# Patient Record
Sex: Male | Born: 1950 | Race: White | Hispanic: No | Marital: Single | Smoking: Current every day smoker
Health system: Southern US, Community
[De-identification: ages and names within clinical notes are randomized; demographics above are authoritative.]

## PROBLEM LIST (undated history)

## (undated) DIAGNOSIS — I2510 Atherosclerotic heart disease of native coronary artery without angina pectoris: Secondary | ICD-10-CM

## (undated) DIAGNOSIS — H9190 Unspecified hearing loss, unspecified ear: Secondary | ICD-10-CM

## (undated) DIAGNOSIS — Z720 Tobacco use: Secondary | ICD-10-CM

## (undated) DIAGNOSIS — R740 Nonspecific elevation of levels of transaminase and lactic acid dehydrogenase [LDH]: Secondary | ICD-10-CM

## (undated) DIAGNOSIS — R7401 Elevation of levels of liver transaminase levels: Secondary | ICD-10-CM

## (undated) DIAGNOSIS — J189 Pneumonia, unspecified organism: Secondary | ICD-10-CM

## (undated) DIAGNOSIS — I255 Ischemic cardiomyopathy: Secondary | ICD-10-CM

## (undated) DIAGNOSIS — E785 Hyperlipidemia, unspecified: Secondary | ICD-10-CM

## (undated) DIAGNOSIS — M1990 Unspecified osteoarthritis, unspecified site: Secondary | ICD-10-CM

## (undated) DIAGNOSIS — I219 Acute myocardial infarction, unspecified: Secondary | ICD-10-CM

## (undated) HISTORY — DX: Elevation of levels of liver transaminase levels: R74.01

## (undated) HISTORY — DX: Hyperlipidemia, unspecified: E78.5

## (undated) HISTORY — DX: Ischemic cardiomyopathy: I25.5

## (undated) HISTORY — DX: Tobacco use: Z72.0

## (undated) HISTORY — DX: Nonspecific elevation of levels of transaminase and lactic acid dehydrogenase (ldh): R74.0

---

## 2014-11-30 ENCOUNTER — Emergency Department (HOSPITAL_COMMUNITY): Payer: Medicare Other

## 2014-11-30 ENCOUNTER — Encounter (HOSPITAL_COMMUNITY): Payer: Self-pay | Admitting: Emergency Medicine

## 2014-11-30 ENCOUNTER — Emergency Department (HOSPITAL_COMMUNITY)
Admission: EM | Admit: 2014-11-30 | Discharge: 2014-11-30 | Disposition: A | Discharge status: Home / Self Care | Payer: Medicare Other | Attending: Emergency Medicine | Admitting: Emergency Medicine

## 2014-11-30 DIAGNOSIS — J45909 Unspecified asthma, uncomplicated: Secondary | ICD-10-CM | POA: Insufficient documentation

## 2014-11-30 DIAGNOSIS — R079 Chest pain, unspecified: Secondary | ICD-10-CM | POA: Diagnosis present

## 2014-11-30 DIAGNOSIS — Z720 Tobacco use: Secondary | ICD-10-CM | POA: Diagnosis not present

## 2014-11-30 DIAGNOSIS — Z880 Allergy status to penicillin: Secondary | ICD-10-CM | POA: Insufficient documentation

## 2014-11-30 DIAGNOSIS — R0789 Other chest pain: Secondary | ICD-10-CM | POA: Diagnosis not present

## 2014-11-30 LAB — BASIC METABOLIC PANEL
ANION GAP: 9 (ref 5–15)
BUN: 6 mg/dL (ref 6–23)
CO2: 23 mmol/L (ref 19–32)
Calcium: 9.3 mg/dL (ref 8.4–10.5)
Chloride: 104 mEq/L (ref 96–112)
Creatinine, Ser: 0.89 mg/dL (ref 0.50–1.35)
GFR calc non Af Amer: 90 mL/min — ABNORMAL LOW (ref >90)
Glucose, Bld: 122 mg/dL — ABNORMAL HIGH (ref 70–99)
POTASSIUM: 4.4 mmol/L (ref 3.5–5.1)
Sodium: 136 mmol/L (ref 135–145)

## 2014-11-30 LAB — I-STAT TROPONIN, ED
Troponin i, poc: 0.01 ng/mL (ref 0.00–0.08)
Troponin i, poc: 0.02 ng/mL (ref 0.00–0.08)

## 2014-11-30 LAB — CBC
HCT: 45.5 % (ref 39.0–52.0)
HEMOGLOBIN: 16 g/dL (ref 13.0–17.0)
MCH: 35.1 pg — AB (ref 26.0–34.0)
MCHC: 35.2 g/dL (ref 30.0–36.0)
MCV: 99.8 fL (ref 78.0–100.0)
Platelets: 185 10*3/uL (ref 150–400)
RBC: 4.56 MIL/uL (ref 4.22–5.81)
RDW: 13.2 % (ref 11.5–15.5)
WBC: 6.5 10*3/uL (ref 4.0–10.5)

## 2014-11-30 LAB — TROPONIN I: Troponin I: <0.03 ng/mL (ref <0.031)

## 2014-11-30 LAB — BRAIN NATRIURETIC PEPTIDE: B NATRIURETIC PEPTIDE 5: 385.3 pg/mL — AB (ref 0.0–100.0)

## 2014-11-30 MED ORDER — AZITHROMYCIN 250 MG PO TABS: 250.0000 mg | ORAL_TABLET | Freq: Every day | ORAL | Status: DC

## 2014-11-30 NOTE — ED Notes (Signed)
PA at bedside.

## 2014-11-30 NOTE — ED Notes (Signed)
EMS reports pt c/o new onset CP 0430, 10/10, called EMS around 0830, pain 8/10.  EMS gave 1 NTG, 324 ASA, pain 2/10.  Pt denies pain now.  EMS states BP before NTG 170/80, after 120/60.  Pt reports hx of chest pain, seen at [facility], cath recommended for blockages but pt declined with no follow-up.

## 2014-11-30 NOTE — Discharge Instructions (Signed)
Take Azithromycin as directed until gone. Follow up with your doctor. Return to the ED with worsening or concerning symptoms.

## 2014-11-30 NOTE — ED Notes (Signed)
Patient transported to X-ray 

## 2014-11-30 NOTE — ED Provider Notes (Signed)
CSN: [id number]     Arrival date & time 11/30/14  0906 History   First MD Initiated Contact with Patient 11/30/14 [phone]     Chief Complaint  Patient presents with  . Chest Pain     (Consider location/radiation/quality/duration/timing/severity/associated sxs/prior Treatment) HPI Comments: Patient is a 64 year old male who presents to the ED via EMS with a past medical history of asthma who presents with chest pain that started this morning at 4:30am that woke him from sleep. The pain was located in the left chest and radiates to his right chest. The pain is described as burning and constant. The pain lasted about 4 hours before resolving after SL nitro by EMS. No aggravating/alleviating factors. Patient reports associated diaphoresis. Patient denies history of MI. He smokes cigarettes. No family history of heart disease.    Past Medical History  Diagnosis Date  . Asthma    History reviewed. No pertinent past surgical history. No family history on file. History  Substance Use Topics  . Smoking status: Current Every Day Smoker -- 0.50 packs/day    Types: Cigarettes  . Smokeless tobacco: Not on file  . Alcohol Use: No    Review of Systems  Constitutional: Negative for fever, chills and fatigue.  HENT: Negative for trouble swallowing.   Eyes: Negative for visual disturbance.  Respiratory: Negative for shortness of breath.   Cardiovascular: Positive for chest pain. Negative for palpitations.  Gastrointestinal: Negative for nausea, vomiting, abdominal pain and diarrhea.  Genitourinary: Negative for dysuria and difficulty urinating.  Musculoskeletal: Negative for arthralgias and neck pain.  Skin: Negative for color change.  Neurological: Negative for dizziness and weakness.  Psychiatric/Behavioral: Negative for dysphoric mood.      Allergies  Penicillins  Home Medications   Prior to Admission medications   Not on File   BP 135/82 mmHg  Pulse 86  Temp(Src) 99.1 F (37.3 C)  (Oral)  Resp 21  SpO2 96% Physical Exam  Constitutional: He is oriented to person, place, and time. He appears well-developed and well-nourished. No distress.  HENT:  Head: Normocephalic and atraumatic.  Eyes: Conjunctivae and EOM are normal.  Neck: Normal range of motion.  Cardiovascular: Normal rate and regular rhythm.  Exam reveals no gallop and no friction rub.   No murmur heard. Pulmonary/Chest: Effort normal and breath sounds normal. He has no wheezes. He has no rales. He exhibits no tenderness.  Abdominal: Soft. He exhibits no distension. There is no tenderness. There is no rebound.  Musculoskeletal: Normal range of motion.  No lower extremity swelling or calf tenderness to palpation.   Neurological: He is alert and oriented to person, place, and time. Coordination normal.  Speech is goal-oriented. Moves limbs without ataxia.   Skin: Skin is warm and dry.  Psychiatric: He has a normal mood and affect. His behavior is normal.  Nursing note and vitals reviewed.   ED Course  Procedures (including critical care time) Labs Review Labs Reviewed  CBC - Abnormal; Notable for the following:    MCH 35.1 (*)    All other components within normal limits  BASIC METABOLIC PANEL - Abnormal; Notable for the following:    Glucose, Bld 122 (*)    GFR calc non Af Amer 90 (*)    All other components within normal limits  BRAIN NATRIURETIC PEPTIDE - Abnormal; Notable for the following:    B Natriuretic Peptide 385.3 (*)    All other components within normal limits  TROPONIN I  I-STAT TROPOININ,  ED  Rosezena Sensor, ED  Rosezena Sensor, ED    Imaging Review Dg Chest 2 View  11/30/2014   CLINICAL DATA:  The patient c/o new onset burning CP across chest 0430, 10/10, pain worse when lying down, called EMS around 0830, pain 8/10. EMS gave 1 NTG, 324 ASA, pain 2/10. Pt denies pain now. EMS states BP before NTG 170/80, after 120/60. Pt reports hx of chest pain, seen at [facility], cath  recommended for blockages but pt declined with no follow-up. Current smoker  EXAM: CHEST  2 VIEW  COMPARISON:  None.  FINDINGS: The heart size and mediastinal contours are within normal limits. Both lungs are clear. The visualized skeletal structures are unremarkable.  IMPRESSION: No active cardiopulmonary disease.   Electronically Signed   By: Rosalie Gums M.D.   On: 11/30/2014 10:02     EKG Interpretation   Date/Time:  Monday November 30 2014 09:07:43 EST Ventricular Rate:  102 PR Interval:  121 QRS Duration: 114 QT Interval:  363 QTC Calculation: 473 R Axis:   -65 Text Interpretation:  Sinus tachycardia LAD, consider left anterior  fascicular block Anteroseptal infarct, old Repol abnrm suggests ischemia,  anterolateral Baseline wander in lead(s) V1 Confirmed by DELOS  MD,  DOUGLAS ([id number]) on 11/30/2014 10:48:33 AM      MDM   Final diagnoses:  Other chest pain    10:45 AM Labs and chest xray unremarkable for acute changes. Vitals stable and patient afebrile.   2:21 PM Patient's delta troponin negative for acute changes. Patient has no chest pain at this time. Patient's HEART score is 3 making him low risk for major cardiac event. Patient will have azithromycin per patient request. Patient instructed to follow up with PCP and return to the ED with worsening or concerning symptoms.   Emilia Beck, PA-C 12/04/14 0865  Geoffery Lyons, MD 12/04/14 [phone]

## 2014-11-30 NOTE — ED Notes (Signed)
NAD at this time. Pt is stable and catching the bus home.  

## 2014-12-22 ENCOUNTER — Encounter (HOSPITAL_COMMUNITY): Payer: Self-pay | Admitting: Emergency Medicine

## 2014-12-22 ENCOUNTER — Emergency Department (HOSPITAL_COMMUNITY): Payer: Medicare Other

## 2014-12-22 ENCOUNTER — Other Ambulatory Visit: Payer: Self-pay | Admitting: *Deleted

## 2014-12-22 ENCOUNTER — Inpatient Hospital Stay (HOSPITAL_COMMUNITY)
Admission: EM | Admit: 2014-12-22 | Discharge: 2014-12-24 | DRG: 249 | Disposition: A | Discharge status: Home / Self Care | Payer: Medicare Other | Attending: Cardiology | Admitting: Cardiology

## 2014-12-22 ENCOUNTER — Encounter (HOSPITAL_COMMUNITY)
Admission: EM | Disposition: A | Discharge status: Home / Self Care | Payer: Self-pay | Source: Home / Self Care | Attending: Cardiology

## 2014-12-22 DIAGNOSIS — F17210 Nicotine dependence, cigarettes, uncomplicated: Secondary | ICD-10-CM | POA: Diagnosis present

## 2014-12-22 DIAGNOSIS — Z880 Allergy status to penicillin: Secondary | ICD-10-CM

## 2014-12-22 DIAGNOSIS — I447 Left bundle-branch block, unspecified: Secondary | ICD-10-CM | POA: Diagnosis present

## 2014-12-22 DIAGNOSIS — Z6829 Body mass index (BMI) 29.0-29.9, adult: Secondary | ICD-10-CM | POA: Diagnosis not present

## 2014-12-22 DIAGNOSIS — Z720 Tobacco use: Secondary | ICD-10-CM

## 2014-12-22 DIAGNOSIS — R739 Hyperglycemia, unspecified: Secondary | ICD-10-CM

## 2014-12-22 DIAGNOSIS — M179 Osteoarthritis of knee, unspecified: Secondary | ICD-10-CM | POA: Diagnosis present

## 2014-12-22 DIAGNOSIS — E785 Hyperlipidemia, unspecified: Secondary | ICD-10-CM | POA: Diagnosis present

## 2014-12-22 DIAGNOSIS — I10 Essential (primary) hypertension: Secondary | ICD-10-CM | POA: Diagnosis present

## 2014-12-22 DIAGNOSIS — M1612 Unilateral primary osteoarthritis, left hip: Secondary | ICD-10-CM | POA: Diagnosis present

## 2014-12-22 DIAGNOSIS — H9190 Unspecified hearing loss, unspecified ear: Secondary | ICD-10-CM | POA: Diagnosis present

## 2014-12-22 DIAGNOSIS — I2510 Atherosclerotic heart disease of native coronary artery without angina pectoris: Secondary | ICD-10-CM | POA: Diagnosis present

## 2014-12-22 DIAGNOSIS — I214 Non-ST elevation (NSTEMI) myocardial infarction: Secondary | ICD-10-CM | POA: Diagnosis present

## 2014-12-22 DIAGNOSIS — I255 Ischemic cardiomyopathy: Secondary | ICD-10-CM | POA: Diagnosis present

## 2014-12-22 DIAGNOSIS — I959 Hypotension, unspecified: Secondary | ICD-10-CM | POA: Diagnosis present

## 2014-12-22 DIAGNOSIS — I252 Old myocardial infarction: Secondary | ICD-10-CM | POA: Diagnosis not present

## 2014-12-22 DIAGNOSIS — I25110 Atherosclerotic heart disease of native coronary artery with unstable angina pectoris: Secondary | ICD-10-CM

## 2014-12-22 DIAGNOSIS — Z598 Other problems related to housing and economic circumstances: Secondary | ICD-10-CM | POA: Diagnosis not present

## 2014-12-22 DIAGNOSIS — G8929 Other chronic pain: Secondary | ICD-10-CM | POA: Diagnosis present

## 2014-12-22 DIAGNOSIS — Z602 Problems related to living alone: Secondary | ICD-10-CM | POA: Diagnosis present

## 2014-12-22 DIAGNOSIS — R079 Chest pain, unspecified: Secondary | ICD-10-CM | POA: Diagnosis present

## 2014-12-22 DIAGNOSIS — E669 Obesity, unspecified: Secondary | ICD-10-CM

## 2014-12-22 DIAGNOSIS — Z01810 Encounter for preprocedural cardiovascular examination: Secondary | ICD-10-CM | POA: Diagnosis not present

## 2014-12-22 HISTORY — DX: Atherosclerotic heart disease of native coronary artery without angina pectoris: I25.10

## 2014-12-22 HISTORY — PX: LEFT HEART CATHETERIZATION WITH CORONARY ANGIOGRAM: SHX5451

## 2014-12-22 HISTORY — DX: Unspecified hearing loss, unspecified ear: H91.90

## 2014-12-22 HISTORY — DX: Acute myocardial infarction, unspecified: I21.9

## 2014-12-22 HISTORY — DX: Unspecified osteoarthritis, unspecified site: M19.90

## 2014-12-22 LAB — GLUCOSE, CAPILLARY
GLUCOSE-CAPILLARY: 187 mg/dL — AB (ref 70–99)
Glucose-Capillary: 119 mg/dL — ABNORMAL HIGH (ref 70–99)
Glucose-Capillary: 145 mg/dL — ABNORMAL HIGH (ref 70–99)

## 2014-12-22 LAB — COMPREHENSIVE METABOLIC PANEL
ALT: 43 U/L (ref 0–53)
AST: 222 U/L — ABNORMAL HIGH (ref 0–37)
Albumin: 4.1 g/dL (ref 3.5–5.2)
Alkaline Phosphatase: 65 U/L (ref 39–117)
Anion gap: 8 (ref 5–15)
CO2: 27 mmol/L (ref 19–32)
Calcium: 9.3 mg/dL (ref 8.4–10.5)
Chloride: 105 mmol/L (ref 96–112)
Creatinine, Ser: 1.04 mg/dL (ref 0.50–1.35)
GFR, EST AFRICAN AMERICAN: 87 mL/min — AB (ref >90)
GFR, EST NON AFRICAN AMERICAN: 75 mL/min — AB (ref >90)
GLUCOSE: 125 mg/dL — AB (ref 70–99)
POTASSIUM: 4.7 mmol/L (ref 3.5–5.1)
Sodium: 140 mmol/L (ref 135–145)
TOTAL PROTEIN: 7.3 g/dL (ref 6.0–8.3)
Total Bilirubin: 1.1 mg/dL (ref 0.3–1.2)

## 2014-12-22 LAB — BASIC METABOLIC PANEL
Anion gap: 13 (ref 5–15)
CHLORIDE: 101 mmol/L (ref 96–112)
CO2: 22 mmol/L (ref 19–32)
Calcium: 9 mg/dL (ref 8.4–10.5)
Creatinine, Ser: 1.17 mg/dL (ref 0.50–1.35)
GFR calc Af Amer: 75 mL/min — ABNORMAL LOW (ref >90)
GFR, EST NON AFRICAN AMERICAN: 65 mL/min — AB (ref >90)
Glucose, Bld: 190 mg/dL — ABNORMAL HIGH (ref 70–99)
POTASSIUM: 3.8 mmol/L (ref 3.5–5.1)
Sodium: 136 mmol/L (ref 135–145)

## 2014-12-22 LAB — I-STAT TROPONIN, ED: Troponin i, poc: 0.12 ng/mL (ref 0.00–0.08)

## 2014-12-22 LAB — CBC
HCT: 44.8 % (ref 39.0–52.0)
HEMOGLOBIN: 16.3 g/dL (ref 13.0–17.0)
MCH: 35.8 pg — AB (ref 26.0–34.0)
MCHC: 36.4 g/dL — AB (ref 30.0–36.0)
MCV: 98.5 fL (ref 78.0–100.0)
Platelets: 239 10*3/uL (ref 150–400)
RBC: 4.55 MIL/uL (ref 4.22–5.81)
RDW: 13 % (ref 11.5–15.5)
WBC: 9.6 10*3/uL (ref 4.0–10.5)

## 2014-12-22 LAB — D-DIMER, QUANTITATIVE: D-Dimer, Quant: 0.33 ug/mL-FEU (ref 0.00–0.48)

## 2014-12-22 LAB — PROTIME-INR
INR: 0.99 (ref 0.00–1.49)
PROTHROMBIN TIME: 13.1 s (ref 11.6–15.2)

## 2014-12-22 LAB — TSH: TSH: 2.111 u[IU]/mL (ref 0.350–4.500)

## 2014-12-22 LAB — HEMOGLOBIN A1C
Hgb A1c MFr Bld: 5 % (ref <5.7)
Mean Plasma Glucose: 97 mg/dL (ref <117)

## 2014-12-22 LAB — TROPONIN I
Troponin I: 0.12 ng/mL — ABNORMAL HIGH (ref <0.031)
Troponin I: 43.54 ng/mL (ref <0.031)
Troponin I: >80 ng/mL (ref <0.031)

## 2014-12-22 LAB — BRAIN NATRIURETIC PEPTIDE: B NATRIURETIC PEPTIDE 5: 91.6 pg/mL (ref 0.0–100.0)

## 2014-12-22 LAB — HEPARIN LEVEL (UNFRACTIONATED): HEPARIN UNFRACTIONATED: 0.14 [IU]/mL — AB (ref 0.30–0.70)

## 2014-12-22 SURGERY — LEFT HEART CATHETERIZATION WITH CORONARY ANGIOGRAM

## 2014-12-22 MED ORDER — VERAPAMIL HCL 2.5 MG/ML IV SOLN
INTRAVENOUS | Status: AC
  Filled 2014-12-22: qty 2

## 2014-12-22 MED ORDER — HEPARIN BOLUS VIA INFUSION
4000.0000 [IU] | Freq: Once | INTRAVENOUS | Status: AC
  Administered 2014-12-22: 4000 [IU] via INTRAVENOUS
  Filled 2014-12-22: qty 4000

## 2014-12-22 MED ORDER — SODIUM CHLORIDE 0.9 % IV SOLN: 250.0000 mL | INTRAVENOUS | Status: DC | PRN

## 2014-12-22 MED ORDER — NICOTINE 14 MG/24HR TD PT24
14.0000 mg | MEDICATED_PATCH | Freq: Every day | TRANSDERMAL | Status: DC
  Administered 2014-12-22 – 2014-12-24 (×3): 14 mg via TRANSDERMAL
  Filled 2014-12-22 (×3): qty 1

## 2014-12-22 MED ORDER — NITROGLYCERIN 1 MG/10 ML FOR IR/CATH LAB
INTRA_ARTERIAL | Status: AC
  Filled 2014-12-22: qty 10

## 2014-12-22 MED ORDER — SODIUM CHLORIDE 0.9 % IJ SOLN: 3.0000 mL | INTRAMUSCULAR | Status: DC | PRN

## 2014-12-22 MED ORDER — METOPROLOL TARTRATE 12.5 MG HALF TABLET
12.5000 mg | ORAL_TABLET | Freq: Two times a day (BID) | ORAL | Status: DC
  Administered 2014-12-22: 12.5 mg via ORAL
  Filled 2014-12-22 (×2): qty 1

## 2014-12-22 MED ORDER — SODIUM CHLORIDE 0.9 % IV SOLN
INTRAVENOUS | Status: DC
  Administered 2014-12-22: 12:00:00 via INTRAVENOUS

## 2014-12-22 MED ORDER — HEPARIN (PORCINE) IN NACL 100-0.45 UNIT/ML-% IJ SOLN: 1200.0000 [IU]/h | INTRAMUSCULAR | Status: DC

## 2014-12-22 MED ORDER — HEPARIN (PORCINE) IN NACL 100-0.45 UNIT/ML-% IJ SOLN
1400.0000 [IU]/h | INTRAMUSCULAR | Status: DC
  Administered 2014-12-22: 18:00:00 1200 [IU]/h via INTRAVENOUS
  Filled 2014-12-22 (×3): qty 250

## 2014-12-22 MED ORDER — INSULIN ASPART 100 UNIT/ML ~~LOC~~ SOLN: 0.0000 [IU] | Freq: Three times a day (TID) | SUBCUTANEOUS | Status: DC

## 2014-12-22 MED ORDER — ASPIRIN 81 MG PO CHEW: 324.0000 mg | CHEWABLE_TABLET | ORAL | Status: DC

## 2014-12-22 MED ORDER — ASPIRIN 81 MG PO CHEW
81.0000 mg | CHEWABLE_TABLET | Freq: Every day | ORAL | Status: DC
  Administered 2014-12-23 – 2014-12-24 (×2): 81 mg via ORAL
  Filled 2014-12-22 (×2): qty 1

## 2014-12-22 MED ORDER — FENTANYL CITRATE 0.05 MG/ML IJ SOLN
INTRAMUSCULAR | Status: AC
  Filled 2014-12-22: qty 2

## 2014-12-22 MED ORDER — MORPHINE SULFATE 4 MG/ML IJ SOLN
4.0000 mg | INTRAMUSCULAR | Status: DC | PRN
  Administered 2014-12-23: 02:00:00 2 mg via INTRAVENOUS
  Filled 2014-12-22: qty 1

## 2014-12-22 MED ORDER — ATORVASTATIN CALCIUM 80 MG PO TABS
80.0000 mg | ORAL_TABLET | Freq: Every day | ORAL | Status: DC
  Filled 2014-12-22: qty 1

## 2014-12-22 MED ORDER — INSULIN ASPART 100 UNIT/ML ~~LOC~~ SOLN
0.0000 [IU] | Freq: Three times a day (TID) | SUBCUTANEOUS | Status: DC
  Administered 2014-12-22: 2 [IU] via SUBCUTANEOUS
  Administered 2014-12-23: 1 [IU] via SUBCUTANEOUS
  Administered 2014-12-23: 18:00:00 2 [IU] via SUBCUTANEOUS

## 2014-12-22 MED ORDER — ACETAMINOPHEN 325 MG PO TABS: 650.0000 mg | ORAL_TABLET | ORAL | Status: DC | PRN

## 2014-12-22 MED ORDER — HEPARIN (PORCINE) IN NACL 100-0.45 UNIT/ML-% IJ SOLN
1200.0000 [IU]/h | INTRAMUSCULAR | Status: DC
  Administered 2014-12-22: 12:00:00 1200 [IU]/h via INTRAVENOUS
  Filled 2014-12-22 (×2): qty 250

## 2014-12-22 MED ORDER — INSULIN ASPART 100 UNIT/ML ~~LOC~~ SOLN: 0.0000 [IU] | Freq: Every day | SUBCUTANEOUS | Status: DC

## 2014-12-22 MED ORDER — NITROGLYCERIN 0.4 MG SL SUBL
0.4000 mg | SUBLINGUAL_TABLET | SUBLINGUAL | Status: AC | PRN
  Administered 2014-12-22 (×3): 0.4 mg via SUBLINGUAL
  Filled 2014-12-22: qty 1

## 2014-12-22 MED ORDER — ONDANSETRON HCL 4 MG/2ML IJ SOLN
4.0000 mg | Freq: Four times a day (QID) | INTRAMUSCULAR | Status: DC | PRN
  Administered 2014-12-23: 4 mg via INTRAVENOUS
  Filled 2014-12-22: qty 2

## 2014-12-22 MED ORDER — SODIUM CHLORIDE 0.9 % IJ SOLN
3.0000 mL | Freq: Two times a day (BID) | INTRAMUSCULAR | Status: DC
Start: 2014-12-22 — End: 2014-12-22

## 2014-12-22 MED ORDER — NITROGLYCERIN 0.4 MG SL SUBL: 0.4000 mg | SUBLINGUAL_TABLET | SUBLINGUAL | Status: DC | PRN

## 2014-12-22 MED ORDER — ASPIRIN EC 81 MG PO TBEC: 81.0000 mg | DELAYED_RELEASE_TABLET | Freq: Every day | ORAL | Status: DC

## 2014-12-22 MED ORDER — NITROGLYCERIN IN D5W 200-5 MCG/ML-% IV SOLN
5.0000 ug/min | INTRAVENOUS | Status: DC
  Administered 2014-12-22: 5 ug/min via INTRAVENOUS

## 2014-12-22 MED ORDER — SODIUM CHLORIDE 0.9 % IV SOLN
1.0000 mL/kg/h | INTRAVENOUS | Status: AC
  Administered 2014-12-22: 16:00:00 1 mL/kg/h via INTRAVENOUS

## 2014-12-22 MED ORDER — LIDOCAINE HCL (PF) 1 % IJ SOLN
INTRAMUSCULAR | Status: AC
  Filled 2014-12-22: qty 30

## 2014-12-22 MED ORDER — HEPARIN (PORCINE) IN NACL 2-0.9 UNIT/ML-% IJ SOLN
INTRAMUSCULAR | Status: AC
  Filled 2014-12-22: qty 1000

## 2014-12-22 MED ORDER — HEPARIN (PORCINE) IN NACL 2-0.9 UNIT/ML-% IJ SOLN
INTRAMUSCULAR | Status: AC
  Filled 2014-12-22: qty 500

## 2014-12-22 MED ORDER — ONDANSETRON HCL 4 MG/2ML IJ SOLN: 4.0000 mg | Freq: Four times a day (QID) | INTRAMUSCULAR | Status: DC | PRN

## 2014-12-22 MED ORDER — NITROGLYCERIN IN D5W 200-5 MCG/ML-% IV SOLN
INTRAVENOUS | Status: AC
  Filled 2014-12-22: qty 250

## 2014-12-22 MED ORDER — ASPIRIN 300 MG RE SUPP
300.0000 mg | RECTAL | Status: DC
  Filled 2014-12-22: qty 1

## 2014-12-22 MED ORDER — MORPHINE SULFATE 4 MG/ML IJ SOLN
4.0000 mg | Freq: Once | INTRAMUSCULAR | Status: AC
  Administered 2014-12-22: 4 mg via INTRAVENOUS
  Filled 2014-12-22: qty 1

## 2014-12-22 MED ORDER — MIDAZOLAM HCL 2 MG/2ML IJ SOLN
INTRAMUSCULAR | Status: AC
  Filled 2014-12-22: qty 2

## 2014-12-22 NOTE — ED Notes (Signed)
Spoke with lab regarding troponin ordered. Able to add on at this time.

## 2014-12-22 NOTE — Consult Note (Signed)
[address]       [place] [address]             [phone]      Cardiothoracic Surgery Consultation   Reason for Consult: Severe multi-vessel coronary artery disease and severe LV dysfunction s/p NSTEMI Referring Physician: Dr. Casandra Doffing  Casey Jackson is an 64 y.o. male.  HPI:    The patient is a 64 year old gentleman who is an active smoker with a probable history of coronary disease, having been admitted to [facility] 2 years ago with chest pain. He was told that it looked like he had a prior MI and cath was recommended. He was homeless and left AMA with no follow up. He has felt well until about 3 weeks ago when he started having exertional chest pain that he describes as burning. It was mainly occuring with walking and relieved with rest. He was seen in the ER on 11/30/2014 after he was awoken from sleep with CP at 4:30 am that persisted for 4 hrs before resolving after sublingual NTG by EMS. His troponin was negative and he was felt to be low risk for a major cardiac event by the ER so he was sent out with a prescription for a Z-pak, although CXR was normal. He took the Z-pak and had no further chest pain until a few days ago when he started having the same pain with exertion. This morning he awoke at 3 am with severe chest burning associated with nausea and vomiting x 1 and diaphoresis that he thought initially was reflux. It improve some with drinking some water and waxed and waned for about 5 hrs before he presented to the ER. His initial troponin was 0.12 and the second was 43.54. ECG showed LBBB that was new from 11/30/2014. His prior ECG showed evidence of prior anteroseptal MI. Cath this morning showed severe 3-vessel CAD with an occluded proximal LAD with faint left to left collaterals, 90% LCX before a moderate OM2, and a large dominant RCA with difusie proximal and mid disease with 95% mid stenosis and 70% stenosis before the PDA, 70% mid PL. The LVEF is 25%  with basal to mid inferior hypokinesis. The LVEDP was 26. He has been pain free since admission.   He currently lives in a rooming house by himself and depends on the bus system for transport, although his rooming house is not on the bus route and he has to walk a distance to a bus stop. He has Medicare and Medicaid and lives on disability.    Past Medical History  Diagnosis Date  . Osteoarthritis   . Coronary artery disease   . Myocardial infarction     nstemi  . HOH (hard of hearing)     Past Surgical History  Procedure Laterality Date  . Cardiac catheterization  12/22/2014    History reviewed. No pertinent family history.  Social History:  reports that he has been smoking Cigarettes.  He has a 25 pack-year smoking history. He has never used smokeless tobacco. He reports that he does not drink alcohol or use illicit drugs.  Allergies:  Allergies  Allergen Reactions  . Penicillins Swelling    Medications:  I have reviewed the patient's current medications. Prior to Admission:  Prescriptions prior to admission  Medication Sig Dispense Refill Last Dose  . azithromycin (ZITHROMAX Z-PAK) 250 MG tablet Take 1 tablet (250 mg total) by mouth daily. 552m PO day 1, then  245m PO days 205 (Patient not taking: Reported on 12/22/2014) 6 tablet 0 Completed Course at Unknown time   Scheduled: . aspirin  324 mg Oral NOW   Or  . aspirin  300 mg Rectal NOW  . [START ON 12/23/2014] aspirin EC  81 mg Oral Daily  . atorvastatin  80 mg Oral q1800  . insulin aspart  0-15 Units Subcutaneous TID WC  . metoprolol tartrate  12.5 mg Oral BID  . nicotine  14 mg Transdermal Daily   Continuous: . sodium chloride 100 mL/hr at 12/22/14 1135  . heparin 1,200 Units/hr (12/22/14 1135)   PJOA:[id number] nitroGLYCERIN, ondansetron (ZOFRAN) IV Anti-infectives    None      Results for orders placed or performed during the hospital encounter of 12/22/14 (from the past 48 hour(s))  CBC      Status: Abnormal   Collection Time: 12/22/14  7:25 AM  Result Value Ref Range   WBC 9.6 4.0 - 10.5 K/uL   RBC 4.55 4.22 - 5.81 MIL/uL   Hemoglobin 16.3 13.0 - 17.0 g/dL   HCT 44.8 39.0 - 52.0 %   MCV 98.5 78.0 - 100.0 fL   MCH 35.8 (H) 26.0 - 34.0 pg   MCHC 36.4 (H) 30.0 - 36.0 g/dL   RDW 13.0 11.5 - 15.5 %   Platelets 239 150 - 400 K/uL  Basic metabolic panel     Status: Abnormal   Collection Time: 12/22/14  7:25 AM  Result Value Ref Range   Sodium 136 135 - 145 mmol/L   Potassium 3.8 3.5 - 5.1 mmol/L   Chloride 101 96 - 112 mmol/L   CO2 22 19 - 32 mmol/L   Glucose, Bld 190 (H) 70 - 99 mg/dL   BUN <5 (L) 6 - 23 mg/dL    Comment: REPEATED TO VERIFY   Creatinine, Ser 1.17 0.50 - 1.35 mg/dL   Calcium 9.0 8.4 - 10.5 mg/dL   GFR calc non Af Amer 65 (L) >90 mL/min   GFR calc Af Amer 75 (L) >90 mL/min    Comment: (NOTE) The eGFR has been calculated using the CKD EPI equation. This calculation has not been validated in all clinical situations. eGFR's persistently <90 mL/min signify possible Chronic Kidney Disease.    Anion gap 13 5 - 15  D-dimer, quantitative     Status: None   Collection Time: 12/22/14  7:25 AM  Result Value Ref Range   D-Dimer, Quant 0.33 0.00 - 0.48 ug/mL-FEU    Comment:        AT THE INHOUSE ESTABLISHED CUTOFF VALUE OF 0.48 ug/mL FEU, THIS ASSAY HAS BEEN DOCUMENTED IN THE LITERATURE TO HAVE A SENSITIVITY AND NEGATIVE PREDICTIVE VALUE OF AT LEAST 98 TO 99%.  THE TEST RESULT SHOULD BE CORRELATED WITH AN ASSESSMENT OF THE CLINICAL PROBABILITY OF DVT / VTE.   Troponin I     Status: Abnormal   Collection Time: 12/22/14  7:25 AM  Result Value Ref Range   Troponin I 0.12 (H) <0.031 ng/mL    Comment:        PERSISTENTLY INCREASED TROPONIN VALUES IN THE RANGE OF 0.04-0.49 ng/mL CAN BE SEEN IN:       -UNSTABLE ANGINA       -CONGESTIVE HEART FAILURE       -MYOCARDITIS       -CHEST TRAUMA       -ARRYHTHMIAS       -LATE PRESENTING MYOCARDIAL INFARCTION        -  COPD   CLINICAL FOLLOW-UP RECOMMENDED.   Protime-INR     Status: None   Collection Time: 12/22/14  7:25 AM  Result Value Ref Range   Prothrombin Time 13.1 11.6 - 15.2 seconds   INR 0.99 0.00 - 1.49  I-stat troponin, ED (not at [facility])     Status: Abnormal   Collection Time: 12/22/14  7:47 AM  Result Value Ref Range   Troponin i, poc 0.12 (HH) 0.00 - 0.08 ng/mL   Comment NOTIFIED PHYSICIAN    Comment 3            Comment: Due to the release kinetics of cTnI, a negative result within the first hours of the onset of symptoms does not rule out myocardial infarction with certainty. If myocardial infarction is still suspected, repeat the test at appropriate intervals.   Glucose, capillary     Status: Abnormal   Collection Time: 12/22/14 11:40 AM  Result Value Ref Range   Glucose-Capillary 119 (H) 70 - 99 mg/dL  Comprehensive metabolic panel     Status: Abnormal   Collection Time: 12/22/14 12:05 PM  Result Value Ref Range   Sodium 140 135 - 145 mmol/L   Potassium 4.7 3.5 - 5.1 mmol/L    Comment: DELTA CHECK NOTED   Chloride 105 96 - 112 mmol/L   CO2 27 19 - 32 mmol/L   Glucose, Bld 125 (H) 70 - 99 mg/dL   BUN <5 (L) 6 - 23 mg/dL   Creatinine, Ser 1.04 0.50 - 1.35 mg/dL   Calcium 9.3 8.4 - 10.5 mg/dL   Total Protein 7.3 6.0 - 8.3 g/dL   Albumin 4.1 3.5 - 5.2 g/dL   AST 222 (H) 0 - 37 U/L   ALT 43 0 - 53 U/L   Alkaline Phosphatase 65 39 - 117 U/L   Total Bilirubin 1.1 0.3 - 1.2 mg/dL   GFR calc non Af Amer 75 (L) >90 mL/min   GFR calc Af Amer 87 (L) >90 mL/min    Comment: (NOTE) The eGFR has been calculated using the CKD EPI equation. This calculation has not been validated in all clinical situations. eGFR's persistently <90 mL/min signify possible Chronic Kidney Disease.    Anion gap 8 5 - 15  TSH     Status: None   Collection Time: 12/22/14 12:05 PM  Result Value Ref Range   TSH 2.111 0.350 - 4.500 uIU/mL  Troponin I-(serum)     Status: Abnormal   Collection Time:  12/22/14 12:05 PM  Result Value Ref Range   Troponin I 43.54 (HH) <0.031 ng/mL    Comment:        POSSIBLE MYOCARDIAL ISCHEMIA. SERIAL TESTING RECOMMENDED. CRITICAL RESULT CALLED TO, READ BACK BY AND VERIFIED WITH: COX L RN 12/22/14 1322 COSTELLO B REPEATED TO VERIFY   Brain natriuretic peptide     Status: None   Collection Time: 12/22/14 12:05 PM  Result Value Ref Range   B Natriuretic Peptide 91.6 0.0 - 100.0 pg/mL    Dg Chest 2 View  12/22/2014   CLINICAL DATA:  64 year old with mid chest pain. Shortness of breath and history of smoking.  EXAM: CHEST  2 VIEW  COMPARISON:  11/30/2014  FINDINGS: Lungs are clear without focal airspace disease or pulmonary edema. Lung markings at the right lung base are mildly prominent but unchanged. Heart and mediastinum are within normal limits. The trachea is midline. Degenerative changes at the right [facility] joint. Degenerative endplate changes in the upper thoracic spine.  No acute bone abnormality.  IMPRESSION: No active cardiopulmonary disease.   Electronically Signed   By: Markus Daft M.D.   On: 12/22/2014 08:02    Review of Systems  Constitutional: Positive for diaphoresis. Negative for fever, chills, weight loss and malaise/fatigue.  HENT: Positive for hearing loss.   Eyes: Negative.   Respiratory: Positive for wheezing. Negative for cough, hemoptysis, sputum production and shortness of breath.   Cardiovascular: Positive for chest pain. Negative for palpitations, orthopnea, claudication, leg swelling and PND.  Gastrointestinal: Positive for heartburn, nausea and vomiting. Negative for abdominal pain, diarrhea and melena.  Genitourinary: Negative.   Musculoskeletal: Positive for joint pain.       Left knee and hip  Skin: Negative.   Neurological: Negative.  Negative for weakness.  Endo/Heme/Allergies: Negative.   Psychiatric/Behavioral: Negative.    Blood pressure 125/73, pulse 92, temperature 98 F (36.7 C), temperature source Oral, resp. rate  18, height _0  (1.803 m), weight 95.255 kg (210 lb), SpO2 95 %. Physical Exam  Constitutional: He is oriented to person, place, and time. He appears well-developed and well-nourished. No distress.  HENT:  Head: Normocephalic and atraumatic.  Mouth/Throat: Oropharynx is clear and moist.  Eyes: EOM are normal. Pupils are equal, round, and reactive to light.  Neck: Normal range of motion. Neck supple. No JVD present. No thyromegaly present.  Cardiovascular: Normal rate, regular rhythm and normal heart sounds.   No murmur heard. Respiratory: Effort normal. No respiratory distress. He has wheezes. He has no rales.  GI: Soft. Bowel sounds are normal. He exhibits no distension and no mass. There is tenderness.  Musculoskeletal: Normal range of motion. He exhibits no edema or tenderness.  Lymphadenopathy:    He has no cervical adenopathy.  Neurological: He is alert and oriented to person, place, and time. He has normal strength. No cranial nerve deficit or sensory deficit.  Skin: Skin is warm and dry.  Psychiatric: He has a normal mood and affect.    I have personally reviewed the cardiac cath films:   PROCEDURE: Left heart catheterization with selective coronary angiography, left ventriculogram.   INDICATIONS: NSTEMI  The risks, benefits, and details of the procedure were explained to the patient. The patient verbalized understanding and wanted to proceed. Informed written consent was obtained.  PROCEDURE TECHNIQUE: After Xylocaine anesthesia a 43F slender sheath was placed in the right radial artery with a single anterior needle wall stick. IV Heparin was given. Right coronary angiography was done using a Judkins R4 guide catheter. Left coronary angiography was done using a Judkins L3.5 guide catheter. Left ventriculography was done using a pigtail catheter. A TR band was used for hemostasis.  CONTRAST: Total of 90 cc.  COMPLICATIONS: None.   HEMODYNAMICS: Aortic  pressure was 105/71; LV pressure was 105/13; LVEDP 26. There was no gradient between the left ventricle and aorta.   ANGIOGRAPHIC DATA: The left main coronary artery is widely patent.  The left anterior descending artery is occluded at the ostium. There are left to left collaterals which feed a large, branching diagonal vessel. There is some visible calcium where the mid LAD should be, distal to the origin of this large branching diagonal. There is just trickle flow noted in the LAD and and several septal vessels from left to left collaterals.  The left circumflex artery is a medium-size vessel. There is a small first obtuse marginal. After this, there is a 90% lesion. There is a large, second obtuse marginal. There is also significant  disease in the proximal portion of the continuation of the circumflex. There is a large ramus intermedius which appears to have only mild disease.  The right coronary artery is a large, dominant vessel. There is diffuse disease in the mid RCA with a focal 95% stenosis. In the distal RCA, just before the bifurcation of the posterior lateral artery and posterior descending artery, there is a 70% stenosis. In the posterior lateral artery, there is a 70% stenosis in the midportion of this vessel, before a large bifurcation. The posterior descending artery is very large and feeds the apex. There are only minimal right-to-left collaterals noted.  LEFT VENTRICULOGRAM: Left ventricular angiogram was done in the 30 RAO projection and revealed severely decreased systolic function with an estimated ejection fraction of 25 %. There is basal to mid inferior hypokinesis. The anterior wall does contract. LVEDP was 26 mmHg.  IMPRESSIONS:  1. Patent left main coronary artery. 2. Occluded left anterior descending artery. Left to left collaterals fil the branches. 3. Severe disease in the mid left circumflex artery and in the large OM 2 branch. 4. Severe disease in the mid  and distal right coronary artery. There is significant disease in the mid posterior lateral artery. 5. Severely decreased left ventricular systolic function. LVEDP 26 mmHg. Ejection fraction 25%.  RECOMMENDATION: Will get cardiac surgery consult for possible bypass surgery. The patient was hesitant and would've preferred percutaneous revascularization. Given his social situation, he may not be able to take dual antiplatelet therapy for a prolonged period of time. I think bypass surgery would give him his best chance of LV recovery, and I discussed this with the patient in detail. We'll restart heparin in about 4 hours after the sheath pull. He'll need to be tested for diabetes and will need aggressive secondary prevention.  Assessment/Plan:  The patient has severe 3-vessel coronary disease and severe LV dysfunction presenting with an acute NSTEMI. His LAD occlusion is most likely old given his history. His troponin went from 0.12 to 43.5 after admission so he had a significant insult. I agree that CABG is the best treatment for this gentleman with severe, diffuse multi-vessel disease with LAD occlusion. His LAD does not show up very well but he still has some anterior contractility so hopefully the LAD is underfilled and graftable. I discussed the operative procedure with the patient including alternatives, benefits and risks; including but not limited to bleeding, blood transfusion, infection, stroke, myocardial infarction, graft failure, heart block requiring a permanent pacemaker, organ dysfunction, and death.  Casey Jackson understands and would like to postpone surgery until 2/7 to do some things that he feels need to be done. I told him I could do surgery this Thursday and I would recommend that given the degree of coronary disease and his EF. I think he would be at high risk for further infarction and death if he postpones surgery.  I had a long talk with him about the risk of waiting as well  as risk factor reduction. He will think about it and talk to Dr. Irish Lack further.    I spent 80 minutes performing this consultation and > 50% of this time was spent face to face counseling and coordinating the care of this patient's coronary disease.   BARTLE,BRYAN K 12/22/2014, 3:19 PM

## 2014-12-22 NOTE — ED Notes (Signed)
Troponin results given to primary nurse

## 2014-12-22 NOTE — Progress Notes (Signed)
ANTICOAGULATION CONSULT NOTE - Initial Consult  Pharmacy Consult for heparin Indication: chest pain/ACS  Allergies  Allergen Reactions  . Penicillins Swelling    Patient Measurements: Height: 5\' 11"  (180.3 cm) Weight: 210 lb (95.255 kg) IBW/kg (Calculated) : 75.3 Heparin Dosing Weight: 93kg  Vital Signs: Temp: 98.4 F (36.9 C) (01/26 0720) BP: 112/63 mmHg (01/26 1000) Pulse Rate: 77 (01/26 1000)  Labs:  Recent Labs  12/22/14 0725  HGB 16.3  HCT 44.8  PLT 239  CREATININE 1.17  TROPONINI 0.12*    Estimated Creatinine Clearance: 77.1 mL/min (by C-G formula based on Cr of 1.17).   Medical History: Past Medical History  Diagnosis Date  . Osteoarthritis     Assessment: [age] YOM who presented 2 weeks ago with some chest pain- troponins were negative and chest pain was resolved with nitro and did not return. Patient was discharged with a Z-pack per his request. Presents today with repeat chest pain, unrelieved by nitro. troponins slightly elevated at this time. Note [facility] and a cath was recommended, but patient declined at the time (~2years ago). Baseline Hgb 16.3, plts 239. Patient confirms with me he was not taking any anticoagulants PTA.  Goal of Therapy:  Heparin level 0.3-0.7 units/ml Monitor platelets by anticoagulation protocol: Yes   Plan:  -heparin bolus with 4000 units IV x1, then start infusion at 1200 units/hr -heparin level in 6 hours -daily HL and CBC -follow for s/s bleeding and cath plans  Melvie Paglia D. Abdulhamid Olgin, PharmD, BCPS Clinical Pharmacist Pager: [phone] 12/22/2014 10:18 AM

## 2014-12-22 NOTE — ED Provider Notes (Addendum)
CSN: [id number]     Arrival date & time 12/22/14  3664 History   First MD Initiated Contact with Patient 12/22/14 0719     Chief Complaint  Patient presents with  . Chest Pain     (Consider location/radiation/quality/duration/timing/severity/associated sxs/prior Treatment) Patient is a 64 y.o. male presenting with chest pain. The history is provided by the patient.  Chest Pain Pain location:  L chest and R chest Pain quality: burning and tightness   Pain radiates to:  L shoulder Pain radiates to the back: no   Pain severity:  Severe Onset quality:  Sudden Duration:  1 hour Timing:  Intermittent Progression:  Unchanged Chronicity:  New Context comment:  This morning pain started after he got up to go to the bathroom. However on January 4 he experienced similar pain and was seen here and discharged home. He said since that time even to go and get the mail he has had chest pain and shortness of breath Relieved by:  Nothing Worsened by:  Exertion and deep breathing Ineffective treatments:  Aspirin and nitroglycerin Associated symptoms: nausea, shortness of breath and vomiting   Associated symptoms: no abdominal pain, no cough and no fever   Risk factors: male sex and smoking   Risk factors: no diabetes mellitus, no hypertension, no immobilization and no surgery     Past Medical History  Diagnosis Date  . Asthma    History reviewed. No pertinent past surgical history. History reviewed. No pertinent family history. History  Substance Use Topics  . Smoking status: Current Every Day Smoker -- 0.50 packs/day    Types: Cigarettes  . Smokeless tobacco: Not on file  . Alcohol Use: No    Review of Systems  Constitutional: Negative for fever.  Respiratory: Positive for shortness of breath. Negative for cough.   Cardiovascular: Positive for chest pain.  Gastrointestinal: Positive for nausea and vomiting. Negative for abdominal pain.  All other systems reviewed and are  negative.     Allergies  Penicillins  Home Medications   Prior to Admission medications   Medication Sig Start Date End Date Taking? Authorizing Provider  azithromycin (ZITHROMAX Z-PAK) 250 MG tablet Take 1 tablet (250 mg total) by mouth daily.  PO day 1, then  PO days 205 11/30/14   Kaitlyn Szekalski, PA-C   BP 123/79 mmHg  Pulse 87  Temp(Src) 98.4 F (36.9 C)  Resp 23  SpO2 95% Physical Exam  Constitutional: He is oriented to person, place, and time. He appears well-developed and well-nourished. No distress.  HENT:  Head: Normocephalic and atraumatic.  Mouth/Throat: Oropharynx is clear and moist.  Eyes: Conjunctivae and EOM are normal. Pupils are equal, round, and reactive to light.  Neck: Normal range of motion. Neck supple.  Cardiovascular: Normal rate, regular rhythm and intact distal pulses.   No murmur heard. Pulmonary/Chest: Effort normal. No respiratory distress. He has no wheezes. He has rhonchi in the left upper field. He has no rales. He exhibits tenderness.  Minimal upper substernal pain to palpation  Abdominal: Soft. He exhibits no distension. There is no tenderness. There is no rebound and no guarding.  Musculoskeletal: Normal range of motion. He exhibits no edema or tenderness.  Neurological: He is alert and oriented to person, place, and time.  Skin: Skin is warm and dry. No rash noted. No erythema.  Psychiatric: He has a normal mood and affect. His behavior is normal.  Nursing note and vitals reviewed.   ED Course  Procedures (including critical care  time) Labs Review Labs Reviewed  CBC - Abnormal; Notable for the following:    MCH 35.8 (*)    MCHC 36.4 (*)    All other components within normal limits  BASIC METABOLIC PANEL - Abnormal; Notable for the following:    Glucose, Bld 190 (*)    BUN <5 (*)    GFR calc non Af Amer 65 (*)    GFR calc Af Amer 75 (*)    All other components within normal limits  I-STAT TROPOININ, ED - Abnormal;  Notable for the following:    Troponin i, poc 0.12 (*)    All other components within normal limits  D-DIMER, QUANTITATIVE  TROPONIN I    Imaging Review Dg Chest 2 View  12/22/2014   CLINICAL DATA:  64 year old with mid chest pain. Shortness of breath and history of smoking.  EXAM: CHEST  2 VIEW  COMPARISON:  11/30/2014  FINDINGS: Lungs are clear without focal airspace disease or pulmonary edema. Lung markings at the right lung base are mildly prominent but unchanged. Heart and mediastinum are within normal limits. The trachea is midline. Degenerative changes at the right [facility] joint. Degenerative endplate changes in the upper thoracic spine. No acute bone abnormality.  IMPRESSION: No active cardiopulmonary disease.   Electronically Signed   By: Richarda OverlieAdam  Henn M.D.   On: 12/22/2014 08:02     EKG Interpretation   Date/Time:  Tuesday December 22 2014 07:18:26 EST Ventricular Rate:  87 PR Interval:  139 QRS Duration: 132 QT Interval:  420 QTC Calculation: 505 R Axis:   -70 Text Interpretation:  Sinus rhythm Atrial premature complex Left bundle  branch block No significant change since last tracing Confirmed by  Anitra LauthPLUNKETT  MD, Alphonzo LemmingsWHITNEY ([id number]) on 12/22/2014 7:27:25 AM      MDM   Final diagnoses:  NSTEMI (non-ST elevated myocardial infarction)   Patient presents with recurrent chest pain. He was seen here on 11/30/14 for chest pain and since that time he has had intermittent episodes worse with exertion. This morning he developed significant pain in the upper left and right chest that radiates slightly into the left arm causing him to be short of breath and vomit twice. He does have a significant history of tobacco abuse and asthma. He denies productive cough or infectious symptoms at this time. Patient does have a pleuritic component and today when given nitroglycerin he had no relief in his pain. He is made an appointment with his physician however will not have follow-up till February 17. EKG  shows a left bundle branch block. This is unchanged from his prior EKG on the fourth however no prior EKGs before that to know if this bundle block is recent.  Patient denies any lower extremity edema, recent immobilization, prolonged travel, surgery.  Concern for angina as the cause of his symptoms versus PE and lower concern for infection at this time.  Patient has scant wheezing in the left upper lobe on exam but otherwise lungs are clear.  CBC, BMP, troponin, chest x-ray, d-dimer pending   8:52 AM D-dimer, CBC, BMP within normal limits. Chest x-ray within normal limits. Troponin elevated at 0. 12. After nitroglycerin pain was slightly improved and morphine given with improvement in pain. Will talk to cardiology for further evaluation.  After speaking with the patient again he states a proximally 2 years ago he was at [facility] and they wanted to do a catheterization at that time however because of his living situation he was unable  to have it performed.  Gwyneth Sprout, MD 12/22/14 1610  Gwyneth Sprout, MD 12/22/14 [phone]

## 2014-12-22 NOTE — ED Notes (Addendum)
Pt has been having chest pain x2 weeks, worsened over night, described as sharp burning that is worse with exertional activity. Pt [facility] ago and was told he had collapsed arteries around his heart. Pt reports it as non-radiating. Pt reports nausea and 3 episodes of vomiting this morning. En route EMS gave 324 aspirin and 1 nitro. Pt experienced no relief from nitro. Pt in Sinus tach at 105. bp 128/80. Pt previous smoker 2 packs/day, has decreased to 7-8 cigs/day. Been smoking since 64 yo. Pt reports being awakened from sleep with worsening pain.

## 2014-12-22 NOTE — Progress Notes (Signed)
ANTICOAGULATION CONSULT NOTE - Initial Consult  Pharmacy Consult for Heparin Indication: Multivessel CAD awaiting CABG  Allergies  Allergen Reactions  . Penicillins Swelling    Patient Measurements: Height: 5\' 11"  (180.3 cm) Weight: 210 lb (95.255 kg) IBW/kg (Calculated) : 75.3 Heparin Dosing Weight: 94.4 kg  Vital Signs: Temp: 98 F (36.7 C) (01/26 1204) Temp Source: Oral (01/26 1204) BP: 106/66 mmHg (01/26 1600) Pulse Rate: 73 (01/26 1600)  Labs:  Recent Labs  12/22/14 0725 12/22/14 1205  HGB 16.3  --   HCT 44.8  --   PLT 239  --   LABPROT 13.1  --   INR 0.99  --   CREATININE 1.17 1.04  TROPONINI 0.12* 43.54*    Estimated Creatinine Clearance: 86.8 mL/min (by C-G formula based on Cr of 1.04).   Medical History: Past Medical History  Diagnosis Date  . Osteoarthritis   . Coronary artery disease   . Myocardial infarction     nstemi  . HOH (hard of hearing)     Assessment: Casey Jackson [facility] CP. The patient underwent a cardiac cath that revealed multivessel CAD - getting CVTS consult for possible CABG. Pharmacy was consulted to resume heparin 4 hour after sheath removed. Per discussion with RN, sheath was pulled around 1330. Baseline CBC wnl, Hep Wt: 95 kg  Goal of Therapy:  Heparin level 0.3-0.7 units/ml Monitor platelets by anticoagulation protocol: Yes   Plan:  1. Restart heparin at 1200 units/hr ~4 hours after sheath removal (~1730) 2. Daily heparin levels, CBC 3. Will continue to monitor for any signs/symptoms of bleeding and will follow up with heparin level in 6 hours   Georgina PillionElizabeth Orvel Cutsforth, PharmD, BCPS Clinical Pharmacist Pager: [phone] 12/22/2014 5:07 PM

## 2014-12-22 NOTE — CV Procedure (Signed)
       PROCEDURE:  Left heart catheterization with selective coronary angiography, left ventriculogram.    INDICATIONS:  NSTEMI  The risks, benefits, and details of the procedure were explained to the patient.  The patient verbalized understanding and wanted to proceed.  Informed written consent was obtained.  PROCEDURE TECHNIQUE:  After Xylocaine anesthesia a 30F slender sheath was placed in the right radial artery with a single anterior needle wall stick.   IV Heparin was given.  Right coronary angiography was done using a Judkins R4 guide catheter.  Left coronary angiography was done using a Judkins L3.5 guide catheter.  Left ventriculography was done using a pigtail catheter.  A TR band was used for hemostasis.   CONTRAST:  Total of 90 cc.  COMPLICATIONS:  None.    HEMODYNAMICS:  Aortic pressure was 105/71; LV pressure was 105/13; LVEDP 26.  There was no gradient between the left ventricle and aorta.    ANGIOGRAPHIC DATA:   The left main coronary artery is widely patent.  The left anterior descending artery is occluded at the ostium. There are left to left collaterals which feed a large, branching diagonal vessel. There is some visible calcium where the mid LAD should be, distal to the origin of this large branching diagonal. There is just trickle flow noted in the LAD and and several septal vessels from left to left collaterals.  The left circumflex artery is a medium-size vessel. There is a small first obtuse marginal. After this, there is a 90% lesion. There is a large, second obtuse marginal. There is also significant disease in the proximal portion of the continuation of the circumflex.  There is a large ramus intermedius which appears to have only mild disease.  The right coronary artery is a large, dominant vessel. There is diffuse disease in the mid RCA with a focal 95% stenosis. In the distal RCA, just before the bifurcation of the posterior lateral artery and posterior descending  artery, there is a 70% stenosis. In the posterior lateral artery, there is a 70% stenosis in the midportion of this vessel, before a large bifurcation. The posterior descending artery is very large and feeds the apex. There are only minimal right-to-left collaterals noted.  LEFT VENTRICULOGRAM:  Left ventricular angiogram was done in the 30 RAO projection and revealed severely decreased systolic function with an estimated ejection fraction of 25 %.  There is basal to mid inferior hypokinesis. The anterior wall does contract. LVEDP was 26 mmHg.  IMPRESSIONS:  1. Patent left main coronary artery. 2. Occluded left anterior descending artery.  Left to left collaterals fil the branches. 3. Severe disease in the mid left circumflex artery and in the large OM 2 branch. 4. Severe disease in the mid and distal right coronary artery.  There is significant disease in the mid posterior lateral artery. 5. Severely decreased left ventricular systolic function.  LVEDP 26 mmHg.  Ejection fraction 25%.  RECOMMENDATION:  Will get cardiac surgery consult for possible bypass surgery. The patient was hesitant and would've preferred percutaneous revascularization. Given his social situation, he may not be able to take dual antiplatelet therapy for a prolonged period of time. I think bypass surgery would give him his best chance of LV recovery, and I discussed this with the patient in detail.  We'll restart heparin in about 4 hours after the sheath pull.  He'll need to be tested for diabetes and will need aggressive secondary prevention.

## 2014-12-22 NOTE — ED Notes (Signed)
Caardiology at bedside.

## 2014-12-22 NOTE — Progress Notes (Signed)
CRITICAL VALUE ALERT  Critical value received:  Troponin I  43.54  Date of notification:  12/22/14  Time of notification:  1324  Critical value read back:Yes.    Nurse who received alert: Victorino DecemberLaura Beaux Wedemeyer RN  MD notified (1st page):  Dr. Eldridge DaceVaranasi  Time of first page: 1325  MD notified (2nd page):  Time of second page:  Responding MD:  Dr Eldridge DaceVaranasi (message given to Amy in cath lab) Dr. Eldridge DaceVaranasi scrubbed for case.  Time MD responded: 1326

## 2014-12-22 NOTE — H&P (Signed)
Physician History and Physical    Patient ID: Casey Jackson MRN: [id number] DOB/AGE: [birth year] 64 y.o. Admit date: 12/22/2014  Primary Care Physician: Scheduled to see [facility] Primary Cardiologist N/A  HPI: 64 yo WM presents to the ED today for worsening chest pain. He awoke at 3 am with severe burning chest pain associated with nausea and vomiting x 1 and diaphoresis. Pain did not resolve for several hours until he came to ED. Now pain free. He reports that he has experienced increasing chest pain for the past 3 weeks. It is worse with exertion and now he can't even walk to his mailbox. He describes it as a burning pain. 2 years ago he was admitted [facility] [place] with similar chest pain. They told him it looked like he had a prior MI and cardiac cath was recommended. He was homeless at the time and had to see about a room here in [place] so he left AMA and had no subsequent follow up. He is now living in a rooming house and has Medicare and Medicaid. He is living on disability. He has no medical follow up but reports he is scheduled to be seen at [facility]. His diet is poor. He eats mostly TV dinners and bologna sandwiches. He has no transportation other than the bus system. He has no know history of DM, HTN, or hyperlipidemia. He has a 50-60 pk yr history of smoking and states he has cut down from 2 pks/day to 0.5 pks/day.   ROS: chronic left hip and knee pain. Otherwise as noted in HPI. All other systems are reviewed and are negative.    Past Medical History  Diagnosis Date  . Osteoarthritis     History reviewed. No pertinent family history.  History   Social History  . Marital Status: Single    Spouse Name: N/A    Number of Children: 0  . Years of Education: N/A   Occupational History  . disabled     former Corporate investment bankerconstruction worker   Social History Main Topics  . Smoking status: Current Every Day Smoker -- 0.50 packs/day for 50 years    Types:  Cigarettes  . Smokeless tobacco: Not on file  . Alcohol Use: No  . Drug Use: Not on file  . Sexual Activity: Not on file   Other Topics Concern  . Not on file   Social History Narrative    History reviewed. No pertinent past surgical history.     Medication List    ASK your doctor about these medications        azithromycin 250 MG tablet  Commonly known as:  ZITHROMAX Z-PAK  Take 1 tablet (250 mg total) by mouth daily. 500mg  PO day 1, then 250mg  PO days 205        Physical Exam: Blood pressure 104/63, pulse 79, temperature 98.4 F (36.9 C), resp. rate 19, SpO2 96 %.  Current Weight  No data found for Wt    GENERAL:  Well appearing, obese WM in NAD HEENT:  PERRL, EOMI, sclera are clear. Oropharynx is clear. Wears dentures. NECK:  No jugular venous distention, carotid upstroke brisk and symmetric, no bruits, no thyromegaly or adenopathy LUNGS:  Clear to auscultation bilaterally CHEST:  Unremarkable HEART:  RRR,  PMI not displaced or sustained,S1 and S2 within normal limits, no S3, no S4: no clicks, no rubs, no murmurs ABD:  Soft, nontender. BS +, no masses or bruits. No hepatomegaly, no splenomegaly EXT:  2 +  pulses throughout, no edema, no cyanosis no clubbing SKIN:  Warm and dry.  No rashes NEURO:  Alert and oriented x 3. Cranial nerves II through XII intact. PSYCH:  Cognitively intact     Labs:   Lab Results  Component Value Date   WBC 9.6 12/22/2014   HGB 16.3 12/22/2014   HCT 44.8 12/22/2014   MCV 98.5 12/22/2014   PLT 239 12/22/2014     Recent Labs Lab 12/22/14 0725  NA 136  K 3.8  CL 101  CO2 22  BUN <5*  CREATININE 1.17  CALCIUM 9.0  GLUCOSE 190*   Lab Results  Component Value Date   TROPONINI 0.12* 12/22/2014   TROPONINI <0.03 11/30/2014   No results found for: CHOL No results found for: HDL No results found for: LDLCALC No results found for: TRIG No results found for: CHOLHDL No results found for: LDLDIRECT  No results found for:  PROBNP No results found for: TSH No results found for: HGBA1C  Radiology: CHEST 2 VIEW  COMPARISON: 11/30/2014  FINDINGS: Lungs are clear without focal airspace disease or pulmonary edema. Lung markings at the right lung base are mildly prominent but unchanged. Heart and mediastinum are within normal limits. The trachea is midline. Degenerative changes at the right [facility] joint. Degenerative endplate changes in the upper thoracic spine. No acute bone abnormality.  IMPRESSION: No active cardiopulmonary disease.   Electronically Signed  By: Richarda Overlie M.D.  On: 12/22/2014 08:02  EKG: NSR with PACs. LBBB. Compared to 11/30/14 the LBBB is new. Prior Ecg showed evidence of prior anteroseptal infarct. I have personally reviewed and interpreted this study.   ASSESSMENT AND PLAN:  1. NSTEMI. Recent accelerating chest pain. Will give ASA and IV heparin. Start beta blocker and statin. Check lipid status.  Plan LHC +/- PCI today. The procedure and risks were reviewed including but not limited to death, myocardial infarction, stroke, arrythmias, bleeding, transfusion, emergency surgery, dye allergy, or renal dysfunction. The patient voices understanding and is agreeable to proceed. 2. Hyperglycemia. ? Diabetic. Check A1c. SSI 3. Tobacco abuse. Counseled on smoking cessation. Start nicotine patch if needed. 4. Osteoarthritis of hip/knee. 5. Obesity. 6. Poor social support. Will need case management to assess home and medication needs.   Signed: Gurveer Colucci [place], MDFACC  12/22/2014, 10:05 AM

## 2014-12-22 NOTE — ED Notes (Signed)
Pt has experienced no relief with 2 nitro, 3rd nitro given at this time. MD notified.

## 2014-12-23 ENCOUNTER — Encounter (HOSPITAL_COMMUNITY): Payer: Self-pay | Admitting: Anesthesiology

## 2014-12-23 ENCOUNTER — Encounter (HOSPITAL_COMMUNITY): Payer: Medicare Other

## 2014-12-23 ENCOUNTER — Encounter (HOSPITAL_COMMUNITY)
Admission: EM | Disposition: A | Discharge status: Home / Self Care | Payer: Medicare Other | Source: Home / Self Care | Attending: Cardiology

## 2014-12-23 DIAGNOSIS — I214 Non-ST elevation (NSTEMI) myocardial infarction: Principal | ICD-10-CM

## 2014-12-23 DIAGNOSIS — I2510 Atherosclerotic heart disease of native coronary artery without angina pectoris: Secondary | ICD-10-CM

## 2014-12-23 DIAGNOSIS — I25110 Atherosclerotic heart disease of native coronary artery with unstable angina pectoris: Secondary | ICD-10-CM

## 2014-12-23 DIAGNOSIS — I5021 Acute systolic (congestive) heart failure: Secondary | ICD-10-CM

## 2014-12-23 HISTORY — PX: PERCUTANEOUS CORONARY STENT INTERVENTION (PCI-S): SHX5485

## 2014-12-23 LAB — BASIC METABOLIC PANEL
Anion gap: 7 (ref 5–15)
BUN: 7 mg/dL (ref 6–23)
CALCIUM: 8.6 mg/dL (ref 8.4–10.5)
CHLORIDE: 103 mmol/L (ref 96–112)
CO2: 26 mmol/L (ref 19–32)
Creatinine, Ser: 1.09 mg/dL (ref 0.50–1.35)
GFR calc non Af Amer: 71 mL/min — ABNORMAL LOW (ref >90)
GFR, EST AFRICAN AMERICAN: 82 mL/min — AB (ref >90)
Glucose, Bld: 112 mg/dL — ABNORMAL HIGH (ref 70–99)
POTASSIUM: 3.8 mmol/L (ref 3.5–5.1)
Sodium: 136 mmol/L (ref 135–145)

## 2014-12-23 LAB — GLUCOSE, CAPILLARY
Glucose-Capillary: 127 mg/dL — ABNORMAL HIGH (ref 70–99)
Glucose-Capillary: 94 mg/dL (ref 70–99)
Glucose-Capillary: 160 mg/dL — ABNORMAL HIGH (ref 70–99)
Glucose-Capillary: 114 mg/dL — ABNORMAL HIGH (ref 70–99)

## 2014-12-23 LAB — CREATININE, SERUM
CREATININE: 1.12 mg/dL (ref 0.50–1.35)
GFR calc non Af Amer: 69 mL/min — ABNORMAL LOW (ref >90)
GFR, EST AFRICAN AMERICAN: 80 mL/min — AB (ref >90)

## 2014-12-23 LAB — CBC
HCT: 40.5 % (ref 39.0–52.0)
HCT: 39.6 % (ref 39.0–52.0)
HEMOGLOBIN: 13.9 g/dL (ref 13.0–17.0)
Hemoglobin: 14.5 g/dL (ref 13.0–17.0)
MCH: 35.5 pg — ABNORMAL HIGH (ref 26.0–34.0)
MCH: 35.2 pg — ABNORMAL HIGH (ref 26.0–34.0)
MCHC: 35.8 g/dL (ref 30.0–36.0)
MCHC: 35.1 g/dL (ref 30.0–36.0)
MCV: 99 fL (ref 78.0–100.0)
MCV: 100.3 fL — ABNORMAL HIGH (ref 78.0–100.0)
Platelets: 197 10*3/uL (ref 150–400)
Platelets: 195 10*3/uL (ref 150–400)
RBC: 4.09 MIL/uL — ABNORMAL LOW (ref 4.22–5.81)
RBC: 3.95 MIL/uL — AB (ref 4.22–5.81)
RDW: 13.2 % (ref 11.5–15.5)
RDW: 13.1 % (ref 11.5–15.5)
WBC: 9.6 10*3/uL (ref 4.0–10.5)
WBC: 8.9 10*3/uL (ref 4.0–10.5)

## 2014-12-23 LAB — TROPONIN I: Troponin I: 75 ng/mL (ref <0.031)

## 2014-12-23 LAB — URINALYSIS, ROUTINE W REFLEX MICROSCOPIC
BILIRUBIN URINE: NEGATIVE
GLUCOSE, UA: NEGATIVE mg/dL
Hgb urine dipstick: NEGATIVE
Ketones, ur: 15 mg/dL — AB
LEUKOCYTES UA: NEGATIVE
Nitrite: NEGATIVE
Protein, ur: NEGATIVE mg/dL
Specific Gravity, Urine: 1.03 (ref 1.005–1.030)
Urobilinogen, UA: 1 mg/dL (ref 0.0–1.0)
pH: 5 (ref 5.0–8.0)

## 2014-12-23 LAB — LIPID PANEL
Cholesterol: 204 mg/dL — ABNORMAL HIGH (ref 0–200)
HDL: 37 mg/dL — ABNORMAL LOW (ref >39)
LDL Cholesterol: 141 mg/dL — ABNORMAL HIGH (ref 0–99)
Total CHOL/HDL Ratio: 5.5 RATIO
Triglycerides: 132 mg/dL (ref <150)
VLDL: 26 mg/dL (ref 0–40)

## 2014-12-23 LAB — SURGICAL PCR SCREEN
MRSA, PCR: NEGATIVE
Staphylococcus aureus: NEGATIVE

## 2014-12-23 LAB — HEPARIN LEVEL (UNFRACTIONATED): Heparin Unfractionated: 0.37 IU/mL (ref 0.30–0.70)

## 2014-12-23 LAB — POCT ACTIVATED CLOTTING TIME: Activated Clotting Time: 405 seconds

## 2014-12-23 SURGERY — PERCUTANEOUS CORONARY STENT INTERVENTION (PCI-S)
Anesthesia: LOCAL

## 2014-12-23 MED ORDER — ACETAMINOPHEN 325 MG PO TABS: 650.0000 mg | ORAL_TABLET | ORAL | Status: DC | PRN

## 2014-12-23 MED ORDER — VERAPAMIL HCL 2.5 MG/ML IV SOLN
INTRAVENOUS | Status: AC
  Filled 2014-12-23: qty 2

## 2014-12-23 MED ORDER — SODIUM CHLORIDE 0.9 % IV SOLN: 250.0000 mL | INTRAVENOUS | Status: DC | PRN

## 2014-12-23 MED ORDER — PRASUGREL HCL 10 MG PO TABS
ORAL_TABLET | ORAL | Status: AC
  Filled 2014-12-23: qty 6

## 2014-12-23 MED ORDER — ASPIRIN 81 MG PO CHEW: 81.0000 mg | CHEWABLE_TABLET | Freq: Every day | ORAL | Status: DC

## 2014-12-23 MED ORDER — PRASUGREL HCL 10 MG PO TABS
ORAL_TABLET | ORAL | Status: AC
  Filled 2014-12-23: qty 1

## 2014-12-23 MED ORDER — NITROGLYCERIN 1 MG/10 ML FOR IR/CATH LAB
INTRA_ARTERIAL | Status: AC
  Filled 2014-12-23: qty 10

## 2014-12-23 MED ORDER — PRASUGREL HCL 10 MG PO TABS
10.0000 mg | ORAL_TABLET | Freq: Every day | ORAL | Status: DC
  Administered 2014-12-24: 10 mg via ORAL
  Filled 2014-12-23 (×2): qty 1

## 2014-12-23 MED ORDER — MIDAZOLAM HCL 2 MG/2ML IJ SOLN
INTRAMUSCULAR | Status: AC
  Filled 2014-12-23: qty 2

## 2014-12-23 MED ORDER — MORPHINE SULFATE 2 MG/ML IJ SOLN: 2.0000 mg | INTRAMUSCULAR | Status: DC | PRN

## 2014-12-23 MED ORDER — BIVALIRUDIN 250 MG IV SOLR
INTRAVENOUS | Status: AC
  Filled 2014-12-23: qty 250

## 2014-12-23 MED ORDER — NICOTINE 14 MG/24HR TD PT24: 14.0000 mg | MEDICATED_PATCH | Freq: Every day | TRANSDERMAL | Status: DC

## 2014-12-23 MED ORDER — GUAIFENESIN-DM 100-10 MG/5ML PO SYRP
5.0000 mL | ORAL_SOLUTION | ORAL | Status: DC | PRN
  Administered 2014-12-23: 5 mL via ORAL
  Filled 2014-12-23: qty 5

## 2014-12-23 MED ORDER — LIDOCAINE HCL (PF) 1 % IJ SOLN
INTRAMUSCULAR | Status: AC
  Filled 2014-12-23: qty 30

## 2014-12-23 MED ORDER — CETYLPYRIDINIUM CHLORIDE 0.05 % MT LIQD
7.0000 mL | Freq: Two times a day (BID) | OROMUCOSAL | Status: DC
  Administered 2014-12-23 – 2014-12-24 (×2): 7 mL via OROMUCOSAL

## 2014-12-23 MED ORDER — SODIUM CHLORIDE 0.9 % IV SOLN: Freq: Once | INTRAVENOUS | Status: DC

## 2014-12-23 MED ORDER — ISOSORBIDE MONONITRATE ER 60 MG PO TB24: 60.0000 mg | ORAL_TABLET | Freq: Every day | ORAL | Status: DC

## 2014-12-23 MED ORDER — ONDANSETRON HCL 4 MG/2ML IJ SOLN: 4.0000 mg | Freq: Four times a day (QID) | INTRAMUSCULAR | Status: DC | PRN

## 2014-12-23 MED ORDER — HEPARIN SODIUM (PORCINE) 5000 UNIT/ML IJ SOLN
5000.0000 [IU] | Freq: Three times a day (TID) | INTRAMUSCULAR | Status: DC
  Administered 2014-12-24: 5000 [IU] via SUBCUTANEOUS
  Filled 2014-12-23 (×3): qty 1

## 2014-12-23 MED ORDER — SODIUM CHLORIDE 0.9 % IV SOLN
0.2500 mg/kg/h | INTRAVENOUS | Status: AC
  Filled 2014-12-23: qty 250

## 2014-12-23 MED ORDER — SODIUM CHLORIDE 0.9 % IJ SOLN
3.0000 mL | Freq: Two times a day (BID) | INTRAMUSCULAR | Status: DC
  Administered 2014-12-23: 13:00:00 3 mL via INTRAVENOUS

## 2014-12-23 MED ORDER — SODIUM CHLORIDE 0.9 % IV SOLN
Freq: Once | INTRAVENOUS | Status: AC
  Administered 2014-12-23: 12:00:00 via INTRAVENOUS

## 2014-12-23 MED ORDER — SODIUM CHLORIDE 0.9 % IJ SOLN: 3.0000 mL | INTRAMUSCULAR | Status: DC | PRN

## 2014-12-23 MED ORDER — FENTANYL CITRATE 0.05 MG/ML IJ SOLN
INTRAMUSCULAR | Status: AC
  Filled 2014-12-23: qty 2

## 2014-12-23 MED ORDER — HEPARIN (PORCINE) IN NACL 2-0.9 UNIT/ML-% IJ SOLN
INTRAMUSCULAR | Status: AC
  Filled 2014-12-23: qty 1000

## 2014-12-23 NOTE — Care Management Note (Signed)
Pt needs CABG, but wishes to leave the hospital..he states he has "some personal business to take care of ", and wants to come back on Feb. 8th to get surgery done.  Pt states he lives alone, and has no support after surgery.  Explained to him that we can assist with possible SNF for short term rehab, if needed, as pt will need 24hr assist for 7-10 days post surgical procedure.  Pt still insists that he is leaving, despite high risk of cardiac arrest, given his significant heart disease.  Dr. Shirlee LatchMcLean at bedside--also explained risks to pt, but he continues to want to leave.    Please notify case manager if pt changes his mind regarding surgical procedure.    Thanks,  Jerrell BelfastJulie Wise Lya Holben, RN, BSN Phone #[phone]

## 2014-12-23 NOTE — Progress Notes (Addendum)
Dr. McLean, Case manager and me all talked with patient regarding staying for tomorrow's CABG, however patient insist on leaving AMA despite knowing risk of high probability of out of the hospital cardiac arrest given his significant 3v CAD. Initially planned to discharge patient   Dr. Varanasi came up and talked with patient, after extensive discussion with patient and Dr. Bartle, will plan for PCI of culprit lesion today and discharge on DAPT tomorrow. This way, he will at least be stabilized and decreased the chance of cardiac arrest. And plan for CABG in 1 month.    Previous Rx sent to pharmacy will be cancelled and has to be reordered on discharge if necessary.    Signed, Chrsitopher Wik PA Pager: [phone]  

## 2014-12-23 NOTE — H&P (View-Only) (Signed)
Dr. Shirlee LatchMcLean, Case manager and me all talked with patient regarding staying for tomorrow's CABG, however patient insist on leaving AMA despite knowing risk of high probability of out of the hospital cardiac arrest given his significant 3v CAD. Initially planned to discharge patient   Dr. Eldridge DaceVaranasi came up and talked with patient, after extensive discussion with patient and Dr. Laneta SimmersBartle, will plan for PCI of culprit lesion today and discharge on DAPT tomorrow. This way, he will at least be stabilized and decreased the chance of cardiac arrest. And plan for CABG in 1 month.    Previous Rx sent to pharmacy will be cancelled and has to be reordered on discharge if necessary.    Ramond DialSigned, Zeola Brys PA Pager: [phone]

## 2014-12-23 NOTE — Progress Notes (Signed)
1 Day Post-Op Procedure(s) (LRB): LEFT HEART CATHETERIZATION WITH CORONARY ANGIOGRAM (N/A) Subjective:  Patient had several hrs of chest pain last pm similar to the prior chest pain but not as severe. ECG showed diffuse ST depression and TWI that was worse than on prior ECG. He was started on NTG with some improvement and complete resolution after Morphine. ECG changes persisted. His troponin peaked at > 80 yesterday afternoon and was 75.00 last pm. He feels ok this am and wants to proceed with surgery tomorrow.  Objective: Vital signs in last 24 hours: Temp:  [97.7 F (36.5 C)-98.7 F (37.1 C)] 98.3 F (36.8 C) (01/27 0810) Pulse Rate:  [66-92] 83 (01/27 0810) Cardiac Rhythm:  [-] Normal sinus rhythm (01/27 0755) Resp:  [15-24] 18 (01/27 0810) BP: (87-125)/(61-87) 103/65 mmHg (01/27 0810) SpO2:  [91 %-98 %] 94 % (01/27 0810) Weight:  [209 lb 14.1 oz (95.2 kg)-210 lb (95.255 kg)] 209 lb 14.1 oz (95.2 kg) (01/27 0021)  Hemodynamic parameters for last 24 hours:    Intake/Output from previous day: 01/26 0701 - 01/27 0700 In: 545.5 [P.O.:340; I.V.:205.5] Out: 1200 [Urine:1200] Intake/Output this shift: Total I/O In: 260 [P.O.:260] Out: 100 [Urine:100]  General appearance: alert and cooperative Heart: regular rate and rhythm, S1, S2 normal, no murmur, click, rub or gallop Lungs: clear to auscultation bilaterally  Lab Results:  Recent Labs  12/22/14 0725 12/23/14 0724  WBC 9.6 9.6  HGB 16.3 14.5  HCT 44.8 40.5  PLT 239 197   BMET:  Recent Labs  12/22/14 1205 12/23/14 0724  NA 140 136  K 4.7 3.8  CL 105 103  CO2 27 26  GLUCOSE 125* 112*  BUN <5* 7  CREATININE 1.04 1.09  CALCIUM 9.3 8.6    PT/INR:  Recent Labs  12/22/14 0725  LABPROT 13.1  INR 0.99   ABG No results found for: PHART, HCO3, TCO2, ACIDBASEDEF, O2SAT CBG (last 3)   Recent Labs  12/22/14 1720 12/22/14 2203 12/23/14 0655  GLUCAP 187* 145* 127*    Assessment/Plan: S/P Procedure(s)  (LRB): LEFT HEART CATHETERIZATION WITH CORONARY ANGIOGRAM (N/A) Severe multi-vessel CAD s/p NSTEMI with recurrent angina. Now pain free on heparin and NTG.  Will check 2D echo today and plan to proceed with CABG in the am. I discussed the operative procedure with the patient again including alternatives, benefits and risks; including but not limited to bleeding, blood transfusion, infection, stroke, myocardial infarction, graft failure, heart block requiring a permanent pacemaker, organ dysfunction, and death.  Casey Jackson understands and agrees to proceed.  We will schedule surgery for tomorrow morning.   LOS: 1 day    Nykole Matos K 12/23/2014

## 2014-12-23 NOTE — Progress Notes (Signed)
ANTICOAGULATION CONSULT NOTE  Pharmacy Consult for Heparin Indication: Multivessel CAD awaiting CABG  Allergies  Allergen Reactions  . Penicillins Swelling    Patient Measurements: Height: 5\' 11"  (180.3 cm) Weight: 209 lb 14.1 oz (95.2 kg) IBW/kg (Calculated) : 75.3 Heparin Dosing Weight: 94.4 kg  Vital Signs: Temp: 98.3 F (36.8 C) (01/27 0810) Temp Source: Oral (01/27 0810) BP: 103/65 mmHg (01/27 0810) Pulse Rate: 83 (01/27 0810)  Labs:  Recent Labs  12/22/14 0725 12/22/14 1205 12/22/14 1655 12/22/14 2312 12/22/14 2318 12/23/14 0724  HGB 16.3  --   --   --   --  14.5  HCT 44.8  --   --   --   --  40.5  PLT 239  --   --   --   --  197  LABPROT 13.1  --   --   --   --   --   INR 0.99  --   --   --   --   --   HEPARINUNFRC  --   --   --   --  0.14* 0.37  CREATININE 1.17 1.04  --   --   --  1.09  TROPONINI 0.12* 43.54* >80.00* 75.00*  --   --     Estimated Creatinine Clearance: 82.8 mL/min (by C-G formula based on Cr of 1.09).  Assessment: 64 yo male with CAD, awaiting CABG, for heparin.  Heparin is therapeutic this am  Goal of Therapy:  Heparin level 0.3-0.7 units/ml Monitor platelets by anticoagulation protocol: Yes   Plan:  Cont heparin  1400 units/hr HL later this afternoon to confirm  Thanks for allowing pharmacy to be a part of this patient's care.  Talbert CageLora Juvencio Verdi, PharmD Clinical Pharmacist, [phone] 12/23/2014 9:56 AM

## 2014-12-23 NOTE — Progress Notes (Signed)
Patient Name: Casey Jackson Date of Encounter: 12/23/2014     Active Problems:   NSTEMI (non-ST elevated myocardial infarction)    SUBJECTIVE  Had 3 hours of CP last night, has resolved since. No SOB. Reconsidered his option and agreed to proceed with surgery tomorrow.   CURRENT MEDS . antiseptic oral rinse  7 mL Mouth Rinse BID  . aspirin  81 mg Oral Daily  . insulin aspart  0-5 Units Subcutaneous QHS  . insulin aspart  0-9 Units Subcutaneous TID WC  . nicotine  14 mg Transdermal Daily    OBJECTIVE  Filed Vitals:   12/23/14 0300 12/23/14 0400 12/23/14 0500 12/23/14 0600  BP: 108/65  Pulse: 73     Temp: 98.3 F (36.8 C)     TempSrc: Oral     Resp: 20     Height:      Weight:      SpO2: 93%       Intake/Output Summary (Last 24 hours) at 12/23/14 0642 Last data filed at 12/23/14 0330  Gross per 24 hour  Intake 545.45 ml  Output   1200 ml  Net -654.55 ml   Filed Weights   12/22/14 1000 12/23/14 0021  Weight: 210 lb (95.255 kg) 209 lb 14.1 oz (95.2 kg)    PHYSICAL EXAM  General: Pleasant, NAD. Neuro: Alert and oriented X 3. Moves all extremities spontaneously. Psych: Normal affect. HEENT:  Normal  Neck: Supple without bruits or JVD. Lungs:  Resp regular and unlabored, CTA. Heart: RRR no s3, s4, or murmurs. Abdomen: Soft, non-tender, non-distended, BS + x 4.  Extremities: No clubbing, cyanosis or edema. DP/PT/Radials 2+ and equal bilaterally.  Accessory Clinical Findings  CBC  Recent Labs  12/22/14 0725  WBC 9.6  HGB 16.3  HCT 44.8  MCV 98.5  PLT 239   Basic Metabolic Panel  Recent Labs  12/22/14 0725 12/22/14 1205  NA 136 140  K 3.8 4.7  CL 101 105  CO2 22 27  GLUCOSE 190* 125*  BUN <5* <5*  CREATININE 1.17 1.04  CALCIUM 9.0 9.3   Liver Function Tests  Recent Labs  12/22/14 1205  AST 222*  ALT 43  ALKPHOS 65  BILITOT 1.1  PROT 7.3  ALBUMIN 4.1   Cardiac Enzymes  Recent Labs  12/22/14 1205  12/22/14 1655 12/22/14 2312  TROPONINI 43.54* >80.00* 75.00*   BNP Invalid input(s): POCBNP D-Dimer  Recent Labs  12/22/14 0725  DDIMER 0.33   Hemoglobin A1C  Recent Labs  12/22/14 1205  HGBA1C 5.0   Thyroid Function Tests  Recent Labs  12/22/14 1205  TSH 2.111    TELE NSR with HR 60-80s, PVCs    ECG  NSR with diffuse TWI and ST changes   Radiology/Studies  Dg Chest 2 View  12/22/2014   CLINICAL DATA:  64 year old with mid chest pain. Shortness of breath and history of smoking.  EXAM: CHEST  2 VIEW  COMPARISON:  11/30/2014  FINDINGS: Lungs are clear without focal airspace disease or pulmonary edema. Lung markings at the right lung base are mildly prominent but unchanged. Heart and mediastinum are within normal limits. The trachea is midline. Degenerative changes at the right [facility] joint. Degenerative endplate changes in the upper thoracic spine. No acute bone abnormality.  IMPRESSION: No active cardiopulmonary disease.   Electronically Signed   By: Richarda Overlie M.D.   On: 12/22/2014 08:02   Dg Chest 2 View  11/30/2014   CLINICAL DATA:  The patient c/o new onset burning CP across chest 0430, 10/10, pain worse when lying down, called EMS around 0830, pain 8/10. EMS gave 1 NTG, 324 ASA, pain 2/10. Pt denies pain now. EMS states BP before NTG 170/80, after 120/60. Pt reports hx of chest pain, [facility] cath recommended for blockages but pt declined with no follow-up. Current smoker  EXAM: CHEST  2 VIEW  COMPARISON:  None.  FINDINGS: The heart size and mediastinal contours are within normal limits. Both lungs are clear. The visualized skeletal structures are unremarkable.  IMPRESSION: No active cardiopulmonary disease.   Electronically Signed   By: Rosalie GumsBeth  Brown M.D.   On: 11/30/2014 10:02    ASSESSMENT AND PLAN  1. NSTEMI  - cath 12/22/2014 EF 25%, occluded LAD with L to L collateral, severe dx in mid LCx and large OM2, severe dx in mid and distal RCA. CT surgery consulted  given his poor social service and likely inability to be compliant with DAPT for prolonged period of time  - seen by Dr. 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 Likely need surgery sooner rather than later. Per Dr. Laneta SimmersBartle, can do surgery this [place]  - continue heparin and IV nitro, discussed with patient this morning, he has agreed to proceed with surgery this [place], family arriving from out of town.  - may consider obtain echo either before or after surgery  Addendum: patient initially agreed to have surgery tomorrow, however got a page from nursing staff that he changed his mind again and wish to delay surgery. He had significant amount of CP and EKG last night despite on heparin gtt and nitro gtt, I do not think it would be wise for him to delay surgery any further. I had extensive conversation with him this morning regarding the need for early surgery. Will ask MD to discuss with patient again today  2. Tobacco abuse 3. Obesity 4. Poor social support: case Production designer, theatre/television/filmmanager to assess home and medication need  Signed, Amedeo PlentyMeng, [name] PA-C Pager: [phone]  Patient seen with PA, agree with the above note.  Patient insists on leaving AMA despite large MI with ischemic cardiomyopathy and ongoing chest pain episodes.  CABG was scheduled for tomorrow.  Pain currently suppressed by NTG gtt.  I had a long discussion with the patient about leaving.  I told him that there is a good chance that he will have a cardiac arrest and die at home.  He apparently lives alone in a boarding house.   I think he understands the risks.  He will not tell me why he has to leave.  I cannot hold him here, I do think he is competent to make the decision to leave. Again, I told him that this is a very, very poor choice.  We will turn off the NTG gtt.  If recurrent chest pain does not change his mind, will send him home on ASA, atorvastatin 80 daily, and Imdur 60 daily.   Will contact [facility] to tell them that he is insisting on leaving.  He says he is willing to have CABG in the future.   Marca AnconaDalton Tokiko Diefenderfer 12/23/2014 11:26 AM

## 2014-12-23 NOTE — Progress Notes (Signed)
Pt experiencing chest pain 4/10, EKG done, Dr. Zachery ConchFriedman called informed of patient's chest pain.  New orders received and Dr. Zachery ConchFriedman came to floor to assess patient.  Will continue to monitor patient.

## 2014-12-23 NOTE — Progress Notes (Signed)
  Echocardiogram 2D Echocardiogram has been performed.  Leta JunglingCooper, Avaley Coop M 12/23/2014, 11:16 AM

## 2014-12-23 NOTE — Significant Event (Signed)
Paged re 4/10 CP. No SOB. Pt reports duration 3-4 hours. ECG obtained demonstrating diffuse ST depression and TWI, worse compared to prior. Patient given total of 4mg  IV morphine with resolution of symptoms. ECG changes still persisted.  A slow resolution of ECG changes would not be unexpected in this situation given extent of CAD and myopathy. Although there is no current indication for IABP or urgent CABG, he is tenuous and may require one or both of these measures.

## 2014-12-23 NOTE — Progress Notes (Signed)
[phone] Did not walk with pt due to CP. Pt states he has difficulty with left knee and hip so he may need PT after surgery. Reviewed sternal precautions with pt and demonstrated for him how we will help him to stand with rocking and assistance. Discussed smoking cessation and pt stated he has quit. Has nicotine patch now. Gave [facility] booklet and care guide. Put on pre op video for pt to view when I left room. Pt will need case manager to see re discharge plan. Pt stated there is NT that lives in rooming house that he would like to help with his care if it can be arranged. Discussed importance of mobility and IS after surgery.  Will follow up after surgery. Luetta NuttingCharlene Charde Macfarlane RN BSN 12/23/2014 9:42 AM

## 2014-12-23 NOTE — Progress Notes (Signed)
ANTICOAGULATION CONSULT NOTE  Pharmacy Consult for Heparin Indication: Multivessel CAD awaiting CABG  Allergies  Allergen Reactions  . Penicillins Swelling    Patient Measurements: Height: 5\' 11"  (180.3 cm) Weight: 210 lb (95.255 kg) IBW/kg (Calculated) : 75.3 Heparin Dosing Weight: 94.4 kg  Vital Signs: Temp: 98.7 F (37.1 C) (01/26 2000) Temp Source: Oral (01/26 2000) BP: 94/62 mmHg (01/26 2200) Pulse Rate: 72 (01/26 2200)  Labs:  Recent Labs  12/22/14 0725 12/22/14 1205 12/22/14 1655 12/22/14 2318  HGB 16.3  --   --   --   HCT 44.8  --   --   --   PLT 239  --   --   --   LABPROT 13.1  --   --   --   INR 0.99  --   --   --   HEPARINUNFRC  --   --   --  0.14*  CREATININE 1.17 1.04  --   --   TROPONINI 0.12* 43.54* >80.00*  --     Estimated Creatinine Clearance: 86.8 mL/min (by C-G formula based on Cr of 1.04).  Assessment: 64 yo male with CAD, awaiting CABG, for heparin  Goal of Therapy:  Heparin level 0.3-0.7 units/ml Monitor platelets by anticoagulation protocol: Yes   Plan:  Increase Heparin  1400 units/hr Follow-up am labs.  Geannie RisenGreg Terreon Ekholm, PharmD, BCPS  12/23/2014 12:01 AM

## 2014-12-23 NOTE — Interval H&P Note (Signed)
Cath Lab Visit (complete for each Cath Lab visit)  Clinical Evaluation Leading to the Procedure:   ACS: Yes.    Non-ACS:    Anginal Classification: CCS IV  Anti-ischemic medical therapy: Minimal Therapy (1 class of medications)  Non-Invasive Test Results: No non-invasive testing performed  Prior CABG: No previous CABG   TIMI Score  Patient Information:  TIMI Score is 5  Revascularization of the presumed culprit artery  A (9)  Indication: 11; Score: 9 TIMI Score  Patient Information:  TIMI Score is 5  Revascularization of multiple coronary arteries when the culprit artery cannot clearly be determined  A (9)  Indication: 12; Score: 9    History and Physical Interval Note:  12/23/2014 3:32 PM  Wilfred CurtisLarry Doorn  has presented today for surgery, with the diagnosis of cad  The various methods of treatment have been discussed with the patient and family. After consideration of risks, benefits and other options for treatment, the patient has consented to  Procedure(s): PERCUTANEOUS CORONARY STENT INTERVENTION (PCI-S) (N/A) as a surgical intervention .  The patient's history has been reviewed, patient examined, no change in status, stable for surgery.  I have reviewed the patient's chart and labs.  Questions were answered to the patient's satisfaction.     Jeryn Cerney S.

## 2014-12-23 NOTE — Progress Notes (Signed)
Morphine 2mg  IV given to patient for chest pain at 01:54, pt denies chest pain, SOB, will continue to monitor patient.

## 2014-12-23 NOTE — CV Procedure (Addendum)
       PROCEDURE:  PCI RCA.  INDICATIONS:  Non-STEMI  The risks, benefits, and details of the procedure were explained to the patient.  The patient verbalized understanding and wanted to proceed.  Informed written consent was obtained.  PROCEDURE TECHNIQUE:  After Xylocaine anesthesia a 13F slender sheath was placed in the right radial artery with a single anterior needle wall stick.   IV Angiomax was given.  Right coronary angiography was done using a Judkins R4 guide catheter.  A pro-water wire was placed across the area disease. Anticoagulation was checked with ACT. A 2.5 x 15 balloon was used to predilate. A 3.0 x 24 rebel bare-metal stent was deployed at high pressure. A 3.5 x 20 noncompliant balloon was used to post dilate. There was an excellent angiographic result. A TR band was used for hemostasis.   CONTRAST:  Total of 40 cc.  COMPLICATIONS:  None.      ANGIOGRAPHIC DATA:     The right coronary artery is a large dominant vessel. In the mid vessel, there is a 95%, hazy stenosis. In the distal RCA, there is an 80% stenosis just before the bifurcation of the posterior lateral and posterior descending arteries. The posterior descending artery is very large and widely patent.  After the intervention, it was easier to see collaterals from the RCA to the LAD system.    IMPRESSIONS:  1. Successful PCI of the mid right coronary artery (95% stenosis to 0%) with bare metal stent, 3.0 x 24 rebel, postdilated to greater than 3.5 mm in diameter.  There is still disease in the distal RCA which would be better treated by bypass.  The posterior lateral artery looks free of disease. SVG to PDA may be sufficient to revascularize the RCA territory.   RECOMMENDATION:  I stressed the importance of dual antiplatelet therapy with aspirin and Effient. The patient states he will take both medications. I explained to him that he will get a card for a 30 day free supply.    The plan is for him to get  bypass surgery done in a month with Dr. Laneta SimmersBartle. The patient was scheduled for bypass surgery tomorrow but earlier today, demanded that he would leave the hospital AMA. He stated he had something he had to do all he would not tell us what it was. We explained to him the risks of leaving the hospital would be reinfarction and possible death. Regardless, he was ready to leave. Since this mid right lesion was likely the culprit for his symptoms, I discussed with Dr. Laneta SimmersBartle about revascularizing this mid right coronary artery lesion with a bare-metal stent, and then planning bypass surgery after a month of dual antiplatelet therapy. The patient was agreeable as well. He will need follow-up with CT surgery in a few weeks. After 30 days, he can stop the Effient for 5 days and have his bypass surgery.  Although this was not ideal for the patient, it was the best compromise that we could come up with given his absolute desire to leave the hospital today, despite significant risks to his health.

## 2014-12-24 ENCOUNTER — Encounter (HOSPITAL_COMMUNITY)
Admission: EM | Disposition: A | Discharge status: Home / Self Care | Payer: Self-pay | Source: Home / Self Care | Attending: Cardiology

## 2014-12-24 ENCOUNTER — Encounter (HOSPITAL_COMMUNITY): Payer: Self-pay | Admitting: Interventional Cardiology

## 2014-12-24 ENCOUNTER — Telehealth: Payer: Self-pay | Admitting: Physician Assistant

## 2014-12-24 DIAGNOSIS — Z01810 Encounter for preprocedural cardiovascular examination: Secondary | ICD-10-CM

## 2014-12-24 DIAGNOSIS — I255 Ischemic cardiomyopathy: Secondary | ICD-10-CM | POA: Diagnosis present

## 2014-12-24 DIAGNOSIS — E785 Hyperlipidemia, unspecified: Secondary | ICD-10-CM | POA: Diagnosis present

## 2014-12-24 LAB — CBC
HEMATOCRIT: 40.1 % (ref 39.0–52.0)
HEMOGLOBIN: 14 g/dL (ref 13.0–17.0)
MCH: 35.5 pg — ABNORMAL HIGH (ref 26.0–34.0)
MCHC: 34.9 g/dL (ref 30.0–36.0)
MCV: 101.8 fL — AB (ref 78.0–100.0)
Platelets: 186 10*3/uL (ref 150–400)
RBC: 3.94 MIL/uL — ABNORMAL LOW (ref 4.22–5.81)
RDW: 13 % (ref 11.5–15.5)
WBC: 7.4 10*3/uL (ref 4.0–10.5)

## 2014-12-24 LAB — BASIC METABOLIC PANEL
Anion gap: 6 (ref 5–15)
BUN: 6 mg/dL (ref 6–23)
CO2: 27 mmol/L (ref 19–32)
Calcium: 8.5 mg/dL (ref 8.4–10.5)
Chloride: 105 mmol/L (ref 96–112)
Creatinine, Ser: 1.09 mg/dL (ref 0.50–1.35)
GFR calc non Af Amer: 71 mL/min — ABNORMAL LOW (ref >90)
GFR, EST AFRICAN AMERICAN: 82 mL/min — AB (ref >90)
Glucose, Bld: 116 mg/dL — ABNORMAL HIGH (ref 70–99)
POTASSIUM: 4.3 mmol/L (ref 3.5–5.1)
Sodium: 138 mmol/L (ref 135–145)

## 2014-12-24 LAB — HEPATIC FUNCTION PANEL
ALT: 30 U/L (ref 0–53)
AST: 72 U/L — ABNORMAL HIGH (ref 0–37)
Albumin: 3.2 g/dL — ABNORMAL LOW (ref 3.5–5.2)
Alkaline Phosphatase: 52 U/L (ref 39–117)
Bilirubin, Direct: 0.1 mg/dL (ref 0.0–0.5)
Indirect Bilirubin: 0.9 mg/dL (ref 0.3–0.9)
TOTAL PROTEIN: 6.2 g/dL (ref 6.0–8.3)
Total Bilirubin: 1 mg/dL (ref 0.3–1.2)

## 2014-12-24 LAB — GLUCOSE, CAPILLARY
GLUCOSE-CAPILLARY: 110 mg/dL — AB (ref 70–99)
Glucose-Capillary: 80 mg/dL (ref 70–99)

## 2014-12-24 SURGERY — CORONARY ARTERY BYPASS GRAFTING (CABG)
Anesthesia: General | Site: Chest

## 2014-12-24 MED ORDER — LISINOPRIL 2.5 MG PO TABS
2.5000 mg | ORAL_TABLET | Freq: Every day | ORAL | Status: DC
  Administered 2014-12-24: 09:00:00 2.5 mg via ORAL
  Filled 2014-12-24: qty 1

## 2014-12-24 MED ORDER — NITROGLYCERIN 0.4 MG SL SUBL: 0.4000 mg | SUBLINGUAL_TABLET | SUBLINGUAL | Status: DC | PRN

## 2014-12-24 MED ORDER — LISINOPRIL 2.5 MG PO TABS: 2.5000 mg | ORAL_TABLET | Freq: Every day | ORAL | Status: DC

## 2014-12-24 MED ORDER — PRASUGREL HCL 10 MG PO TABS: 10.0000 mg | ORAL_TABLET | Freq: Every day | ORAL | Status: DC

## 2014-12-24 MED ORDER — ATORVASTATIN CALCIUM 40 MG PO TABS: 40.0000 mg | ORAL_TABLET | Freq: Every day | ORAL | Status: DC

## 2014-12-24 MED FILL — Sodium Chloride IV Soln 0.9%: INTRAVENOUS | Qty: 50 | Status: AC

## 2014-12-24 NOTE — Progress Notes (Signed)
VASCULAR LAB PRELIMINARY  PRELIMINARY  PRELIMINARY  PRELIMINARY  Pre-op Cardiac Surgery  Carotid Findings:  Bilateral:  1-39% ICA stenosis.  Vertebral artery flow is antegrade.      Upper Extremity Right Left  Brachial Pressures 108 triphasic 111 triphasic  Radial Waveforms triphasic triphasic  Ulnar Waveforms triphasic triphasic  Palmar Arch (Allen's Test) * **   Findings:  *Right:  Doppler waveforms remain normal with ulnar and radial compressions.  **Left:  Doppler waveforms remain normal with ulnar and obliterate with radial compressions.    Lower  Extremity Right Left  Dorsalis Pedis    Anterior Tibial 38 severely dampened monophasic 56 severely dampened monophasic  Posterior Tibial 50 monophasic 97 biphasic  Ankle/Brachial Indices 0.45 0.87    Findings:  ABI indicates a severe reduction in arterial flow on the right and a mild reduction on the left.   Raaga Maeder, RVT 12/24/2014, 10:06 AM

## 2014-12-24 NOTE — Telephone Encounter (Signed)
TCM patient  Has an appt on 2/5 at 11:15am per Theodore Demarkhonda Barrett

## 2014-12-24 NOTE — Progress Notes (Signed)
CARDIAC REHAB PHASE I   PRE:  Rate/Rhythm: 91SR  BP:  Supine: 109/61  Sitting:   Standing:    SaO2: 97%RA  MODE:  Ambulation: 500 ft   POST:  Rate/Rhythm: 110 ST  BP:  Supine:   Sitting: 131/74  Standing:    SaO2: 98%RA 0815-0909 Pt walked 500 ft independently with steady gait. No chest burning which pt was happy about. Stressed importance of watching salt with low heart function. Gave pt heart healthy and low sodium handouts. Reviewed importance of effient with stent and gave pt booklet with card. Reviewed NTG use and calling 911. Reviewed MI restrictions and encouraged pt to take it easy as he does a lot of walking. Reviewed temperature precautions and walking on level ground. Did not give ex walking instruction as pt needs surgery. Did tell pt about CRP 2 for after surgery and that we really want to help him get to his healthiest state. Pt promised he would be back for his surgery. Pt does not have scales to weigh daily but encouraged him to watch sodium and let cardiologist know if swelling in belly, feet or ankles.   Luetta Nuttingharlene Ariadne Rissmiller, RN BSN  12/24/2014 8:59 AM

## 2014-12-24 NOTE — Discharge Summary (Signed)
CARDIOLOGY DISCHARGE SUMMARY   Patient ID: Casey CurtisLarry Jackson MRN: [id number] DOB/AGE: 1951/11/10 64 y.o.  Admit date: 12/22/2014 Discharge date: 12/24/2014  PCP: No primary care provider on file. Primary Cardiologist: Dr. SwazilandJordan  Primary Discharge Diagnosis:  NSTEMI - S/P 3.0 x 24 rebel BMS to the RCA, med rx for residual dz, see below, needs CABG  Secondary Discharge Diagnosis:    Hyperlipidemia LDL goal <100   Cardiomyopathy, ischemic   Tobacco use  Consults: Dr. Laneta SimmersBartle  Procedures: Cardiac catheterization, coronary angiogram, left ventriculogram, recath w/ PCI RCA.   Hospital Course: Casey CurtisLarry Vieth is a 64 y.o. male with no history of CAD. He had chest pain, N&V and came to the hospital. His initial troponin was elevated and he was admitted for further evaluation and treatment.   His peak troponin was > 80.00, he was taken to the cath lab on 01/26, results are below. A TCTS consult was called.   He was seen by Dr. Laneta SimmersBartle and surgery was planned for 01/28. He was pain-free on ASA, heparin and nitrates. He was not on a beta blocker or an ACE inhibitor because of borderline blood pressure. His systolic was dropping into the 80s at time while on IV nitroglycerin. He was symptomatic with this. He was not on a statin initially because his LFTs were abnormal. They were rechecked at discharge and have improved so he will be started on a statin.  On 12/23/2014, the patient stated he realized he would need extensive rehabilitation after the surgery and had to go home and take care of some things before he could have. He refuses surgery was adamant about it. He was adamant about needing to go home prior to coming back for the surgery. Therefore, a compromise decision was made to do intervention on the RCA with a bare metal stent with medical therapy for other disease and follow-up as an outpatient.  On 01/27, he was taken back to the cath lab and had a bare-metal stent to the RCA, tolerating  the procedure well. He was given a prescription for Effient which he can get free for 30 days. If he has the bypass surgery, it will be discontinued after 30 days. If he does not have the bypass surgery, consider continuing it.  On 01/28, he was seen by Dr. SwazilandJordan and all data were reviewed. He has hyperlipidemia, lipid profile shown below and his AST had improved so he was started on Lipitor 40 mg daily. His blood sugars had been elevated but hemoglobin A1c was within normal limits at 5.0, so the only dietary changes that are recommended a heart healthy. His blood pressure had improved slightly and he was started on low dose of an ACE inhibitor. We will hold off on a beta blocker for now, as his systolic blood pressure is still approximately 100.  He was seen by cardiac rehabilitation and educated on MI restrictions, stent guidelines, exercise guidelines and heart-healthy lifestyle modifications. Smoking cessation was encouraged and reinforced. He was ambulating without chest pain or shortness of breath and no further inpatient workup was indicated once pre-CABG testing was completed. He is considered stable for discharge, to follow-up as an outpatient.  Labs:   Lab Results  Component Value Date   WBC 7.4 12/24/2014   HGB 14.0 12/24/2014   HCT 40.1 12/24/2014   MCV 101.8* 12/24/2014   PLT 186 12/24/2014     Recent Labs Lab 12/24/14 0510  NA 138  K 4.3  CL 105  CO2 27  BUN 6  CREATININE 1.09  CALCIUM 8.5  PROT 6.2  BILITOT 1.0  ALKPHOS 52  ALT 30  AST 72*  GLUCOSE 116*    Recent Labs  12/22/14 1205 12/22/14 1655 12/22/14 2312  TROPONINI 43.54* >80.00* 75.00*   Lipid Panel     Component Value Date/Time   CHOL 204* 12/23/2014 0724   TRIG 132 12/23/2014 0724   HDL 37* 12/23/2014 0724   CHOLHDL 5.5 12/23/2014 0724   VLDL 26 12/23/2014 0724   LDLCALC 141* 12/23/2014 0724   Lab Results  Component Value Date   HGBA1C 5.0 12/22/2014    Recent Labs  12/22/14 0725    INR 0.99     Radiology: Dg Chest 2 View 12/22/2014   CLINICAL DATA:  64 year old with mid chest pain. Shortness of breath and history of smoking.  EXAM: CHEST  2 VIEW  COMPARISON:  11/30/2014  FINDINGS: Lungs are clear without focal airspace disease or pulmonary edema. Lung markings at the right lung base are mildly prominent but unchanged. Heart and mediastinum are within normal limits. The trachea is midline. Degenerative changes at the right [facility] joint. Degenerative endplate changes in the upper thoracic spine. No acute bone abnormality.  IMPRESSION: No active cardiopulmonary disease.   Electronically Signed   By: Richarda Overlie M.D.   On: 12/22/2014 08:02   CATH: 12/22/2014 ANGIOGRAPHIC DATA: The left main coronary artery is widely patent. The left anterior descending artery is occluded at the ostium. There are left to left collaterals which feed a large, branching diagonal vessel. There is some visible calcium where the mid LAD should be, distal to the origin of this large branching diagonal. There is just trickle flow noted in the LAD and and several septal vessels from left to left collaterals. The left circumflex artery is a medium-size vessel. There is a small first obtuse marginal. After this, there is a 90% lesion. There is a large, second obtuse marginal. There is also significant disease in the proximal portion of the continuation of the circumflex. There is a large ramus intermedius which appears to have only mild disease. The right coronary artery is a large, dominant vessel. There is diffuse disease in the mid RCA with a focal 95% stenosis. In the distal RCA, just before the bifurcation of the posterior lateral artery and posterior descending artery, there is a 70% stenosis. In the posterior lateral artery, there is a 70% stenosis in the midportion of this vessel, before a large bifurcation. The posterior descending artery is very large and feeds the apex. There are only minimal right-to-left  collaterals noted. LEFT VENTRICULOGRAM: Left ventricular angiogram was done in the 30 RAO projection and revealed severely decreased systolic function with an estimated ejection fraction of 25 %. There is basal to mid inferior hypokinesis. The anterior wall contracts. LVEDP was 26 mmHg. IMPRESSIONS: 1. Patent left main coronary artery. 2. Occluded left anterior descending artery. Left to left collaterals fil the branches. 3. Severe disease in the mid left circumflex artery and in the large OM 2 branch. 4. Severe disease in the mid and distal right coronary artery. There is significant disease in the mid posterior lateral artery. 5. Severely decreased left ventricular systolic function. LVEDP 26 mmHg. Ejection fraction 25%. RECOMMENDATION: Will get cardiac surgery consult for possible bypass surgery. The patient was hesitant and would've preferred percutaneous revascularization. Given his social situation, he may not be able to take dual antiplatelet therapy for a prolonged period of time. I think  bypass surgery would give him his best chance of LV recovery, and I discussed this with the patient in detail. We'll restart heparin in about 4 hours after the sheath pull. He'll need to be tested for diabetes and will need aggressive secondary prevention.  EKG: 12/24/2014 Sinus rhythm Left axis deviation Incomplete right bundle branch block Septal infarct , age undetermined Marked ST abnormality, possible inferior subendocardial injury Prolonged QT Abnormal ECG No significant change since last tracing Vent. rate 83 BPM PR interval 126 ms QRS duration 112 ms QT/QTc 434/509 ms P-R-T axes 71 -48 -89  Echo: 12/23/2014 Conclusions - Left ventricle: The cavity size was moderately dilated. Wall thickness was normal. Systolic function was severely reduced. The estimated ejection fraction was in the range of 25% to 30%. Diffuse hypokinesis. There is akinesis of the  entireinferolateral and inferior myocardium. - Mitral valve: There was mild regurgitation. - Atrial septum: No defect or patent foramen ovale was identified.  FOLLOW UP PLANS AND APPOINTMENTS Allergies  Allergen Reactions  . Penicillins Swelling     Medication List    STOP taking these medications        azithromycin 250 MG tablet  Commonly known as:  ZITHROMAX Z-PAK      TAKE these medications        aspirin 81 MG chewable tablet  Chew 1 tablet (81 mg total) by mouth daily.     atorvastatin 40 MG tablet  Commonly known as:  LIPITOR  Take 1 tablet (40 mg total) by mouth daily.     lisinopril 2.5 MG tablet  Commonly known as:  PRINIVIL,ZESTRIL  Take 1 tablet (2.5 mg total) by mouth daily.     nicotine 14 mg/24hr patch  Commonly known as:  NICODERM CQ - dosed in mg/24 hours  Place 1 patch (14 mg total) onto the skin daily.     nitroGLYCERIN 0.4 MG SL tablet  Commonly known as:  NITROSTAT  Place 1 tablet (0.4 mg total) under the tongue every 5 (five) minutes as needed for chest pain.     prasugrel 10 MG Tabs tablet  Commonly known as:  EFFIENT  Take 1 tablet (10 mg total) by mouth daily.  Notes to Patient:  If you are having bypass surgery, take only for 30 days. If you decide not to have the surgery, continue this medication.        Discharge Instructions    Diet - low sodium heart healthy    Complete by:  As directed      Increase activity slowly    Complete by:  As directed           Follow-up Information    Follow up with Ronie Spies, PA-C On 01/01/2015.   Specialty:  Cardiology   Why:  at 11:15 am   Contact information:   [address] [place] [address] [phone]       Follow up with Alleen Borne, MD.   Specialty:  Cardiothoracic Surgery   Contact information:   [address] [place] [address] [place] [address] [phone]       BRING ALL MEDICATIONS WITH YOU TO FOLLOW UP APPOINTMENTS  Time spent with patient  to include physician time: 48 min Signed: Theodore Demark, PA-C 12/24/2014, 9:17 AM Co-Sign MD

## 2014-12-24 NOTE — Discharge Instructions (Signed)
PLEASE REMEMBER TO BRING ALL OF YOUR MEDICATIONS TO EACH OF YOUR FOLLOW-UP OFFICE VISITS. ° °PLEASE ATTEND ALL SCHEDULED FOLLOW-UP APPOINTMENTS.  ° °Activity: Increase activity slowly as tolerated. You may shower, but no soaking baths (or swimming) for 1 week. No driving for 1 week. No lifting over 5 lbs for 2 weeks. No sexual activity for 1 week.  ° °You May Return to Work: in 3 weeks (if applicable) ° °Wound Care: You may wash cath site gently with soap and water. Keep cath site clean and dry. If you notice pain, swelling, bleeding or pus at your cath site, please call [phone]. ° ° ° °Cardiac Cath Site Care °Refer to this sheet in the next few weeks. These instructions provide you with information on caring for yourself after your procedure. Your caregiver may also give you more specific instructions. Your treatment has been planned according to current medical practices, but problems sometimes occur. Call your caregiver if you have any problems or questions after your procedure. °HOME CARE INSTRUCTIONS °· You may shower 24 hours after the procedure. Remove the bandage (dressing) and gently wash the site with plain soap and water. Gently pat the site dry.  °· Do not apply powder or lotion to the site.  °· Do not sit in a bathtub, swimming pool, or whirlpool for 5 to 7 days.  °· No bending, squatting, or lifting anything over 10 pounds (4.5 kg) as directed by your caregiver.  °· Inspect the site at least twice daily.  °· Do not drive home if you are discharged the same day of the procedure. Have someone else drive you.  °· You may drive 24 hours after the procedure unless otherwise instructed by your caregiver.  °What to expect: °· Any bruising will usually fade within 1 to 2 weeks.  °· Blood that collects in the tissue (hematoma) may be painful to the touch. It should usually decrease in size and tenderness within 1 to 2 weeks.  °SEEK IMMEDIATE MEDICAL CARE IF: °· You have unusual pain at the site or down the  affected limb.  °· You have redness, warmth, swelling, or pain at the site.  °· You have drainage (other than a small amount of blood on the dressing).  °· You have chills.  °· You have a fever or persistent symptoms for more than 72 hours.  °· You have a fever and your symptoms suddenly get worse.  °· Your leg becomes pale, cool, tingly, or numb.  °· You have heavy bleeding from the site. Hold pressure on the site.  °Document Released: 12/16/2010 Document Revised: 11/02/2011 Document Reviewed:  ° °

## 2014-12-24 NOTE — Telephone Encounter (Signed)
TCM pt--- Pt discharged on 12/24/14. Triage nurse to call on 12/25/14

## 2014-12-24 NOTE — Progress Notes (Signed)
Patient Name: Casey Jackson Date of Encounter: 12/24/2014  Principal Problem:   NSTEMI (non-ST elevated myocardial infarction) Active Problems:   Hyperlipidemia LDL goal <100   Cardiomyopathy, ischemic   Primary Cardiologist: [place], Dr. Sw[place]il[place]Jordan  Patient Profile: 64 yo male w/ no prev CAD was admitted 01/26 w/ NSTEMI, pt refused CABG and had BMS RCA. Hyperlipidemia, tob use, EF 25%.  SUBJECTIVE: No chest pain overnight, patient stated he saw the video for bypass surgery yesterday and realized how long the recovery would be. States he needs to go home and get some things taken care of before he can come back for the surgery. No chest pain or shortness of breath overnight.  OBJECTIVE Filed Vitals:   12/24/14 0025 12/24/14 0400 12/24/14 0500 12/24/14 0600  BP: 102/61 97/59 89/56  104/65  Pulse: 95 82 81 85  Temp: 98.4 F (36.9 C)  98.1 F (36.7 C)   TempSrc: Oral  Oral   Resp: 18  20   Height:      Weight: 210 lb 15.7 oz (95.7 kg)     SpO2: 100% 96% 97% 96%    Intake/Output Summary (Last 24 hours) at 12/24/14 0624 Last data filed at 12/23/14 2040  Gross per 24 hour  Intake   1085 ml  Output    900 ml  Net    185 ml   Filed Weights   12/22/14 1000 12/23/14 0021 12/24/14 0025  Weight: 210 lb (95.255 kg) 209 lb 14.1 oz (95.2 kg) 210 lb 15.7 oz (95.7 kg)    PHYSICAL EXAM General: Well developed, well nourished, male in no acute distress. Head: Normocephalic, atraumatic.  Neck: Supple without bruits, JVD not elevated Lungs:  Resp regular and unlabored, few rales, good air exchange Heart: RRR, S1, S2, no S3, S4, or murmur; no rub. Abdomen: Soft, non-tender, non-distended, BS + x 4.  Extremities: No clubbing, cyanosis, no edema. Right radial cath site without ecchymosis or hematoma Neuro: Alert and oriented X 3. Moves all extremities spontaneously. Psych: Normal affect.  LABS: CBC:  Recent Labs  12/23/14 1949 12/24/14 0510  WBC 8.9 7.4  HGB 13.9 14.0  HCT 39.6  40.1  MCV 100.3* 101.8*  PLT 195 186   INR:  Recent Labs  12/22/14 0725  INR 0.99   Basic Metabolic Panel:  Recent Labs  46/96/2901/26/16 1205 12/23/14 0724 12/23/14 1949  NA 140 136  --   K 4.7 3.8  --   CL 105 103  --   CO2 27 26  --   GLUCOSE 125* 112*  --   BUN <5* 7  --   CREATININE 1.04 1.09 1.12  CALCIUM 9.3 8.6  --    Liver Function Tests:  Recent Labs  12/22/14 1205  AST 222*  ALT 43  ALKPHOS 65  BILITOT 1.1  PROT 7.3  ALBUMIN 4.1   Cardiac Enzymes:  Recent Labs  12/22/14 1205 12/22/14 1655 12/22/14 2312  TROPONINI 43.54* >80.00* 75.00*    Recent Labs  12/22/14 0747  TROPIPOC 0.12*   D-dimer:  Recent Labs  12/22/14 0725  DDIMER 0.33   Hemoglobin A1C:  Recent Labs  12/22/14 1205  HGBA1C 5.0   Fasting Lipid Panel:  Recent Labs  12/23/14 0724  CHOL 204*  HDL 37*  LDLCALC 141*  TRIG 132  CHOLHDL 5.5   Thyroid Function Tests:  Recent Labs  12/22/14 1205  TSH 2.111   TELE:  SR, ST    ECHO: 12/23/2014 - Left ventricle:  The cavity size was moderately dilated. Wall thickness was normal. Systolic function was severely reduced. The estimated ejection fraction was in the range of 25% to 30%. Diffuse hypokinesis. There is akinesis of the entireinferolateral and inferior myocardium. - Mitral valve: There was mild regurgitation. - Atrial septum: No defect or patent foramen ovale was identified.  CATH: 12/22/2014 ANGIOGRAPHIC DATA: The left main coronary artery is widely patent. The left anterior descending artery is occluded at the ostium. There are left to left collaterals which feed a large, branching diagonal vessel. There is some visible calcium where the mid LAD should be, distal to the origin of this large branching diagonal. There is just trickle flow noted in the LAD and and several septal vessels from left to left collaterals. The left circumflex artery is a medium-size vessel. There is a small first obtuse  marginal. After this, there is a 90% lesion. There is a large, second obtuse marginal. There is also significant disease in the proximal portion of the continuation of the circumflex. There is a large ramus intermedius which appears to have only mild disease. The right coronary artery is a large, dominant vessel. There is diffuse disease in the mid RCA with a focal 95% stenosis. In the distal RCA, just before the bifurcation of the posterior lateral artery and posterior descending artery, there is a 70% stenosis. In the posterior lateral artery, there is a 70% stenosis in the midportion of this vessel, before a large bifurcation. The posterior descending artery is very large and feeds the apex. There are only minimal right-to-left collaterals noted. LEFT VENTRICULOGRAM: Left ventricular angiogram was done in the 30 RAO projection and revealed severely decreased systolic function with an estimated ejection fraction of 25 %. There is basal to mid inferior hypokinesis. The anterior wall contracts. LVEDP was 26 mmHg. IMPRESSIONS: 1. Patent left main coronary artery. 2. Occluded left anterior descending artery. Left to left collaterals fil the branches. 3. Severe disease in the mid left circumflex artery and in the large OM 2 branch. 4. Severe disease in the mid and distal right coronary artery. There is significant disease in the mid posterior lateral artery. 5. Severely decreased left ventricular systolic function. LVEDP 26 mmHg. Ejection fraction 25%. RECOMMENDATION: Will get cardiac surgery consult for possible bypass surgery. The patient was hesitant and would've preferred percutaneous revascularization. Given his social situation, he may not be able to take dual antiplatelet therapy for a prolonged period of time. I think bypass surgery would give him his best chance of LV recovery, and I discussed this with the patient in detail. We'll restart heparin in about 4 hours after the sheath pull.  He'll need to be tested for diabetes and will need aggressive secondary prevention.  Radiology/Studies: Dg Chest 2 View 12/22/2014   CLINICAL DATA:  64 year old with mid chest pain. Shortness of breath and history of smoking.  EXAM: CHEST  2 VIEW  COMPARISON:  11/30/2014  FINDINGS: Lungs are clear without focal airspace disease or pulmonary edema. Lung markings at the right lung base are mildly prominent but unchanged. Heart and mediastinum are within normal limits. The trachea is midline. Degenerative changes at the right [name] joint. Degenerative endplate changes in the upper thoracic spine. No acute bone abnormality.  IMPRESSION: No active cardiopulmonary disease.   Electronically Signed   By: Richarda Overlie M.D.   On: 12/22/2014 08:02   Current Medications:  . antiseptic oral rinse  7 mL Mouth Rinse BID  . aspirin  81 mg Oral  Daily  . heparin  5,000 Units Subcutaneous 3 times per day  . insulin aspart  0-5 Units Subcutaneous QHS  . insulin aspart  0-9 Units Subcutaneous TID WC  . nicotine  14 mg Transdermal Daily  . prasugrel  10 mg Oral Daily   . nitroGLYCERIN Stopped (12/24/14 0430)    ASSESSMENT AND PLAN: Principal Problem:   NSTEMI (non-ST elevated myocardial infarction) - pt refused CABG, had BMS RCA, otherwise medical therapy. Not on BB due to hypotension. IV Nitrates discontinued overnight for low BP.   Active Problems:   Hyperlipidemia LDL goal <100 - AST was elevated on admission, so statin not started. M.D. review and advise    Hyperglycemia - fasting blood sugars have been elevated but hemoglobin A1c was within normal limits at 5.0. Encourage healthier eating habits. This will be difficult due to his social situation.    Ischemic cardiomyopathy - due to hypotension, patient not currently on ACE inhibitor or beta blocker. M.D. advise if patient would be a candidate for Life Vest.  Plan - discharge today  Signed, Theodore Demark , PA-C 6:24 AM 12/24/2014  Patient seen and  examined and history reviewed. Agree with above findings and plan. No chest pain. Off IV Ntg. No wrist hematoma. BP soft but improved off IV Ntg. Will start low dose lisinopril 2.5 mg daily. Will recheck LFTs. If improved will start statin therapy. Glucoses have improved. Plan DC today. Patient understands his cardiac situation and is motivated to return for CABG. Will need close follow up.   Peter Sw[place], MDFACC 12/24/2014 7:17 AM

## 2014-12-24 NOTE — Progress Notes (Signed)
Pt stated" I  dont  want to go for  my surgery tomorrow, I will come back on the 8th to have it done"  Explained significance of tx re. to his cardiac status, patient insisted on leaving , [name] ,Dr Eldridge DaceVaranasi notified. Case manager also notified and seen patient.

## 2014-12-24 NOTE — Telephone Encounter (Signed)
lmtcb Debbie Dolores Ewing RN  

## 2014-12-25 NOTE — Telephone Encounter (Signed)
lmtcb

## 2014-12-28 ENCOUNTER — Emergency Department (HOSPITAL_COMMUNITY)
Admission: EM | Admit: 2014-12-28 | Discharge: 2014-12-28 | Disposition: A | Discharge status: Home / Self Care | Payer: Medicare Other | Attending: Emergency Medicine | Admitting: Emergency Medicine

## 2014-12-28 ENCOUNTER — Encounter (HOSPITAL_COMMUNITY): Payer: Self-pay | Admitting: Emergency Medicine

## 2014-12-28 DIAGNOSIS — I2510 Atherosclerotic heart disease of native coronary artery without angina pectoris: Secondary | ICD-10-CM | POA: Insufficient documentation

## 2014-12-28 DIAGNOSIS — M1990 Unspecified osteoarthritis, unspecified site: Secondary | ICD-10-CM | POA: Diagnosis not present

## 2014-12-28 DIAGNOSIS — Z9889 Other specified postprocedural states: Secondary | ICD-10-CM | POA: Diagnosis not present

## 2014-12-28 DIAGNOSIS — Z9861 Coronary angioplasty status: Secondary | ICD-10-CM | POA: Diagnosis not present

## 2014-12-28 DIAGNOSIS — M25431 Effusion, right wrist: Secondary | ICD-10-CM

## 2014-12-28 DIAGNOSIS — R2231 Localized swelling, mass and lump, right upper limb: Secondary | ICD-10-CM | POA: Diagnosis not present

## 2014-12-28 DIAGNOSIS — I252 Old myocardial infarction: Secondary | ICD-10-CM | POA: Diagnosis not present

## 2014-12-28 DIAGNOSIS — Z7902 Long term (current) use of antithrombotics/antiplatelets: Secondary | ICD-10-CM | POA: Diagnosis not present

## 2014-12-28 DIAGNOSIS — Z79899 Other long term (current) drug therapy: Secondary | ICD-10-CM | POA: Insufficient documentation

## 2014-12-28 DIAGNOSIS — R253 Fasciculation: Secondary | ICD-10-CM

## 2014-12-28 DIAGNOSIS — R252 Cramp and spasm: Secondary | ICD-10-CM | POA: Diagnosis present

## 2014-12-28 DIAGNOSIS — Z720 Tobacco use: Secondary | ICD-10-CM | POA: Insufficient documentation

## 2014-12-28 DIAGNOSIS — Z7982 Long term (current) use of aspirin: Secondary | ICD-10-CM | POA: Diagnosis not present

## 2014-12-28 DIAGNOSIS — G253 Myoclonus: Secondary | ICD-10-CM | POA: Diagnosis not present

## 2014-12-28 DIAGNOSIS — Z880 Allergy status to penicillin: Secondary | ICD-10-CM | POA: Diagnosis not present

## 2014-12-28 LAB — CBC WITH DIFFERENTIAL/PLATELET
Basophils Absolute: 0 10*3/uL (ref 0.0–0.1)
Basophils Relative: 0 % (ref 0–1)
Eosinophils Absolute: 0.2 10*3/uL (ref 0.0–0.7)
Eosinophils Relative: 2 % (ref 0–5)
HCT: 38.9 % — ABNORMAL LOW (ref 39.0–52.0)
Hemoglobin: 13.6 g/dL (ref 13.0–17.0)
Lymphocytes Relative: 19 % (ref 12–46)
Lymphs Abs: 1.3 10*3/uL (ref 0.7–4.0)
MCH: 34.4 pg — ABNORMAL HIGH (ref 26.0–34.0)
MCHC: 35 g/dL (ref 30.0–36.0)
MCV: 98.5 fL (ref 78.0–100.0)
MONOS PCT: 9 % (ref 3–12)
Monocytes Absolute: 0.6 10*3/uL (ref 0.1–1.0)
NEUTROS PCT: 70 % (ref 43–77)
Neutro Abs: 4.5 10*3/uL (ref 1.7–7.7)
Platelets: 231 10*3/uL (ref 150–400)
RBC: 3.95 MIL/uL — AB (ref 4.22–5.81)
RDW: 13.2 % (ref 11.5–15.5)
WBC: 6.5 10*3/uL (ref 4.0–10.5)

## 2014-12-28 LAB — BASIC METABOLIC PANEL
ANION GAP: 6 (ref 5–15)
BUN: 5 mg/dL — AB (ref 6–23)
CALCIUM: 9 mg/dL (ref 8.4–10.5)
CHLORIDE: 108 mmol/L (ref 96–112)
CO2: 25 mmol/L (ref 19–32)
CREATININE: 0.88 mg/dL (ref 0.50–1.35)
GFR calc Af Amer: >90 mL/min (ref >90)
GFR calc non Af Amer: >90 mL/min (ref >90)
Glucose, Bld: 117 mg/dL — ABNORMAL HIGH (ref 70–99)
POTASSIUM: 3.9 mmol/L (ref 3.5–5.1)
Sodium: 139 mmol/L (ref 135–145)

## 2014-12-28 NOTE — Discharge Instructions (Signed)
Wrist Pain Your wrist swelling is due to your previous procedure. Please avoid strenuous activity with your right wrist.  Wrist injuries are frequent in adults and children. A sprain is an injury to the ligaments that hold your bones together. A strain is an injury to muscle or muscle cord-like structures (tendons) from stretching or pulling. Generally, when wrists are moderately tender to touch following a fall or injury, a break in the bone (fracture) may be present. Most wrist sprains or strains are better in 3 to 5 days, but complete healing may take several weeks. HOME CARE INSTRUCTIONS   Put ice on the injured area.  Put ice in a plastic bag.  Place a towel between your skin and the bag.  Leave the ice on for 15-20 minutes, 3-4 times a day, for the first 2 days, or as directed by your health care provider.  Keep your arm raised above the level of your heart whenever possible to reduce swelling and pain.  Rest the injured area for at least 48 hours or as directed by your health care provider.  If a splint or elastic bandage has been applied, use it for as long as directed by your health care provider or until seen by a health care provider for a follow-up exam.  Only take over-the-counter or prescription medicines for pain, discomfort, or fever as directed by your health care provider.  Keep all follow-up appointments. You may need to follow up with a specialist or have follow-up X-rays. Improvement in pain level is not a guarantee that you did not fracture a bone in your wrist. The only way to determine whether or not you have a broken bone is by X-ray. SEEK IMMEDIATE MEDICAL CARE IF:   Your fingers are swollen, very red, white, or cold and blue.  Your fingers are numb or tingling.  You have increasing pain.  You have difficulty moving your fingers. MAKE SURE YOU:   Understand these instructions.  Will watch your condition.  Will get help right away if you are not doing  well or get worse. Document Released: 08/23/2005 Document Revised: 11/18/2013 Document Reviewed: 01/04/2011 [facility] Patient Information 2015 [place], [place]. This information is not intended to replace advice given to you by your health care provider. Make sure you discuss any questions you have with your health care provider. Muscle Cramps and Spasms Muscle cramps and spasms occur when a muscle or muscles tighten and you have no control over this tightening (involuntary muscle contraction). They are a common problem and can develop in any muscle. The most common place is in the calf muscles of the leg. Both muscle cramps and muscle spasms are involuntary muscle contractions, but they also have differences:   Muscle cramps are sporadic and painful. They may last a few seconds to a quarter of an hour. Muscle cramps are often more forceful and last longer than muscle spasms.  Muscle spasms may or may not be painful. They may also last just a few seconds or much longer. CAUSES  It is uncommon for cramps or spasms to be due to a serious underlying problem. In many cases, the cause of cramps or spasms is unknown. Some common causes are:   Overexertion.   Overuse from repetitive motions (doing the same thing over and over).   Remaining in a certain position for a long period of time.   Improper preparation, form, or technique while performing a sport or activity.   Dehydration.   Injury.  Side effects of some medicines.   Abnormally low levels of the salts and ions in your blood (electrolytes), especially potassium and calcium. This could happen if you are taking water pills (diuretics) or you are pregnant.  Some underlying medical problems can make it more likely to develop cramps or spasms. These include, but are not limited to:   Diabetes.   Parkinson disease.   Hormone disorders, such as thyroid problems.   Alcohol abuse.   Diseases specific to muscles, joints, and  bones.   Blood vessel disease where not enough blood is getting to the muscles.  HOME CARE INSTRUCTIONS   Stay well hydrated. Drink enough water and fluids to keep your urine clear or pale yellow.  It may be helpful to massage, stretch, and relax the affected muscle.  For tight or tense muscles, use a warm towel, heating pad, or hot shower water directed to the affected area.  If you are sore or have pain after a cramp or spasm, applying ice to the affected area may relieve discomfort.  Put ice in a plastic bag.  Place a towel between your skin and the bag.  Leave the ice on for 15-20 minutes, 03-04 times a day.  Medicines used to treat a known cause of cramps or spasms may help reduce their frequency or severity. Only take over-the-counter or prescription medicines as directed by your caregiver. SEEK MEDICAL CARE IF:  Your cramps or spasms get more severe, more frequent, or do not improve over time.  MAKE SURE YOU:   Understand these instructions.  Will watch your condition.  Will get help right away if you are not doing well or get worse. Document Released: 05/05/2002 Document Revised: 03/10/2013 Document Reviewed: 10/30/2012 [facility] Patient Information 2015 [place]. This information is not intended to replace advice given to you by your health care provider. Make sure you discuss any questions you have with your health care provider.

## 2014-12-28 NOTE — Telephone Encounter (Signed)
Follow Up  Pt wanted to pass along the message that he is headed to the ED. Had a spasm this morning 2/1 @ about 4:00 am. No pain but wanted to be seen.

## 2014-12-28 NOTE — ED Notes (Signed)
Pt reports around 0400 today noticed swelling to right wrist with discoloration. Pt reports also c/o spasms to right arm and leg. Pt had heart stent placed on 12/22/2014. Pt denies SOB or sweating, but reports nausea.

## 2014-12-28 NOTE — Telephone Encounter (Signed)
Spoke to patient he stated he was having chest spasms this morning.St[facility].Stated EKG,lab work normal.Stated he has been having swelling in rt wrist cath site.Stated he was advised not to do anything strenuous and to apply ice.Stated he feels better this afternoon.Advised to keep post hospital appointment with Ronie Spiesayna Dunn PA Friday 01/01/15 at 11:15 am.

## 2014-12-28 NOTE — ED Provider Notes (Signed)
CSN: [id number]     Arrival date & time 12/28/14  0944 History   First MD Initiated Contact with Patient 12/28/14 1001     Chief Complaint  Patient presents with  . Spasms  . Post-op Problem   Casey Jackson is a 64 y.o. male with a history of MI, cardiovascular disease and osteoarthritis who presents to the emergency department complaining of right wrist swelling since yesterday and right sided body spasm earlier this morning. The patient reports he was lying in bed when he felt his whole body spasm for a second. He then felt like his right side of his body jerked. This lasted for a second and has not returned. He denies any pain. The patient had a cardiac cath with stent placement on 12/22/14 with access through his right radial artery. He does report some nausea earlier this morning, that resolved with eating some raisins. He denies current nausea. He reports feeling well since the cath. He denies fevers, chest pain, loss of consciousness, shortness of breath, palpitations, abdominal pain, vomiting, numbness, tingling or weakness. He denies history of seizures.   (Consider location/radiation/quality/duration/timing/severity/associated sxs/prior Treatment) HPI  Past Medical History  Diagnosis Date  . Osteoarthritis   . Coronary artery disease   . Myocardial infarction     NSTEMI  . HOH (hard of hearing)   . Smoker    Past Surgical History  Procedure Laterality Date  . Left heart catheterization with coronary angiogram N/A 12/22/2014    Procedure: LEFT HEART CATHETERIZATION WITH CORONARY ANGIOGRAM;  Surgeon: Corky Crafts, MD; LAD 100%, CFX 90%, RI mild dz, RCA 95%/70%, EF 25%, CABG recommended  . Percutaneous coronary stent intervention (pci-s) N/A 12/23/2014    Procedure: PERCUTANEOUS CORONARY STENT INTERVENTION (PCI-S);  Surgeon: Corky Crafts, MD; 3.0 x 24 rebel BMS to the RCA    No family history on file. History  Substance Use Topics  . Smoking status: Current Every Day  Smoker -- 0.50 packs/day for 50 years    Types: Cigarettes  . Smokeless tobacco: Never Used  . Alcohol Use: No    Review of Systems  Constitutional: Negative for fever and chills.  HENT: Negative for congestion and sore throat.   Eyes: Negative for visual disturbance.  Respiratory: Negative for cough, shortness of breath and wheezing.   Cardiovascular: Negative for chest pain and palpitations.  Gastrointestinal: Negative for nausea, vomiting, abdominal pain and diarrhea.  Genitourinary: Negative for dysuria.  Musculoskeletal: Negative for back pain and neck pain.       Right wrist swelling  Skin: Negative for rash.  Neurological: Negative for dizziness, syncope, light-headedness and headaches.      Allergies  Penicillins  Home Medications   Prior to Admission medications   Medication Sig Start Date End Date Taking? Authorizing Provider  aspirin 81 MG chewable tablet Chew 1 tablet (81 mg total) by mouth daily. 12/23/14  Yes Azalee Course, PA  atorvastatin (LIPITOR) 40 MG tablet Take 1 tablet (40 mg total) by mouth daily. 12/24/14  Yes Rhonda G Barrett, PA-C  lisinopril (PRINIVIL,ZESTRIL) 2.5 MG tablet Take 1 tablet (2.5 mg total) by mouth daily. 12/24/14  Yes Rhonda G Barrett, PA-C  nicotine (NICODERM CQ - DOSED IN MG/24 HOURS) 14 mg/24hr patch Place 1 patch (14 mg total) onto the skin daily. 12/23/14  Yes Azalee Course, PA  nitroGLYCERIN (NITROSTAT) 0.4 MG SL tablet Place 1 tablet (0.4 mg total) under the tongue every 5 (five) minutes as needed for chest pain. 12/24/14  Yes  Rhonda G Barrett, PA-C  prasugrel (EFFIENT) 10 MG TABS tablet Take 1 tablet (10 mg total) by mouth daily. 12/24/14  Yes Rhonda G Barrett, PA-C   BP 116/68 mmHg  Pulse 76  Resp 22  Ht  (1.803 m)  Wt 210 lb (95.255 kg)  BMI 29.30 kg/m2  SpO2 94% Physical Exam  Constitutional: He is oriented to person, place, and time. He appears well-developed and well-nourished. No distress.  HENT:  Head: Normocephalic and  atraumatic.  Mouth/Throat: Oropharynx is clear and moist.  Eyes: Conjunctivae are normal. Pupils are equal, round, and reactive to light. Right eye exhibits no discharge. Left eye exhibits no discharge.  Neck: Neck supple.  Cardiovascular: Normal rate, regular rhythm, normal heart sounds and intact distal pulses.  Exam reveals no gallop and no friction rub.   No murmur heard. Strong bilateral radial pulses.   Pulmonary/Chest: Effort normal and breath sounds normal. No respiratory distress. He has no wheezes. He has no rales.  Abdominal: Soft. He exhibits no distension. There is no tenderness.  Musculoskeletal:  Mild edema noted to right wrist. No rash. Full wrist ROM. Strong radial pulses. Sensation intact. Good grip strength bilaterally.   Lymphadenopathy:    He has no cervical adenopathy.  Neurological: He is alert and oriented to person, place, and time. Coordination normal.  Sensation is intact in bilateral hands.   Skin: Skin is warm and dry. No rash noted. He is not diaphoretic. No erythema. No pallor.  Psychiatric: He has a normal mood and affect. His behavior is normal.  Nursing note and vitals reviewed.   ED Course  Procedures (including critical care time) Labs Review Labs Reviewed  BASIC METABOLIC PANEL - Abnormal; Notable for the following:    Glucose, Bld 117 (*)    BUN 5 (*)    All other components within normal limits  CBC WITH DIFFERENTIAL/PLATELET - Abnormal; Notable for the following:    RBC 3.95 (*)    HCT 38.9 (*)    MCH 34.4 (*)    All other components within normal limits    Imaging Review No results found.   EKG Interpretation   Date/Time:  Monday December 28 2014 09:55:53 EST Ventricular Rate:  91 PR Interval:  126 QRS Duration: 124 QT Interval:  360 QTC Calculation: 443 R Axis:   -59 Text Interpretation:  Sinus rhythm RBBB and LAFB Nonspecific T  abnormalities, lateral leads No significant change since last tracing  Confirmed by [facility]  MD,  WHITNEY ([id number]) on 12/28/2014 11:39:26 AM      Filed Vitals:   12/28/14 1100 12/28/14 1130 12/28/14 1200 12/28/14 1215  BP: 114/66 116/61 116/68   Pulse: 79 80 76   Resp: Height:      Weight:      SpO2: 96% 96% 94%      MDM   Final diagnoses:  Wrist swelling, right  Jerking   This is a 64 y.o. male with a history of MI, cardiovascular disease and osteoarthritis who presents to the emergency department complaining of right wrist swelling since yesterday and right sided body spasm earlier this morning. Patient had a cardiac cath through his right radial artery on 12/12/14. Patient reports swelling to his right wrist but no pain. He has good distal pulses and cap refill. He has good sensation and strength in his right hand. The patient is non-toxic appearing. He denies numbness, tingling or weakness. She has had no spasms or jerking since arrival  to the ED. Patient's EKG shows a sinus rhythm with a right bundle blanch block and nonspecific T-wave abnormalities. There is no significant change since the last EKG. Patient has a normal potassium. BMP and CBC is unremarkable. I advised the patient to avoid strenuous activity with his right wrist. I advised the patient to follow-up with his cardiologist. I advised the patient to follow-up with their primary care provider this week. I advised the patient to return to the emergency department with new or worsening symptoms or new concerns. The patient verbalized understanding and agreement with plan.   This patient was discussed with and evaluated by Dr. Anitra LauthPlunkett who agrees with assessment and plan.      Lawana ChambersWilliam Duncan Makaria Poarch, PA-C 12/28/14 1528  Gwyneth SproutWhitney Plunkett, MD 12/28/14 [phone]

## 2014-12-28 NOTE — Telephone Encounter (Signed)
NOTED  WILL FORWARD MESSAGE  TO DR JORDAN FOSwaziland REVIEW .Zack Seal/CY

## 2015-01-01 ENCOUNTER — Encounter: Payer: Self-pay | Admitting: Physician Assistant

## 2015-01-01 ENCOUNTER — Telehealth: Payer: Self-pay | Admitting: Physician Assistant

## 2015-01-01 ENCOUNTER — Encounter: Payer: Medicare Other | Admitting: Physician Assistant

## 2015-01-01 DIAGNOSIS — R740 Nonspecific elevation of levels of transaminase and lactic acid dehydrogenase [LDH]: Secondary | ICD-10-CM

## 2015-01-01 DIAGNOSIS — H9190 Unspecified hearing loss, unspecified ear: Secondary | ICD-10-CM | POA: Insufficient documentation

## 2015-01-01 DIAGNOSIS — R7401 Elevation of levels of liver transaminase levels: Secondary | ICD-10-CM | POA: Insufficient documentation

## 2015-01-01 DIAGNOSIS — Z720 Tobacco use: Secondary | ICD-10-CM | POA: Insufficient documentation

## 2015-01-01 DIAGNOSIS — M1990 Unspecified osteoarthritis, unspecified site: Secondary | ICD-10-CM | POA: Insufficient documentation

## 2015-01-01 DIAGNOSIS — I2510 Atherosclerotic heart disease of native coronary artery without angina pectoris: Secondary | ICD-10-CM | POA: Insufficient documentation

## 2015-01-01 NOTE — Progress Notes (Deleted)
Cardiology Office Note Date:  01/01/2015  Patient ID:  Casey Jackson, DOB 13-Mar-1951, MRN [id number] PCP:  No primary care provider on file.  Cardiologist:  Dr. SwazilandJordan  ***refresh   Chief Complaint: follow up of CAD  History of Present Illness: Casey Jackson is a 64 y.o. male with history of recently diagnosed CAD/NSTEMI, HLD, ICM, tobacco abuse who presents for post-hospital follow-up. He was admitted 1/26-1/28/16 with large NSTEMI with peak troponin >80. He was found to have surgicla disease by cath with initial plans for CABG 12/24/14. However, on 12/23/2014, the patient stated he realized he would need extensive rehabilitation after the surgery and had to go home and take care of some things beforehand. He adamantly refused surgery and almost left AMA. Therefore, a compromise decision was made to do intervention on the RCA with a bare metal stent with medical therapy for other disease and follow-up as an outpatient. On 01/27, he was taken back to the cath lab and had a bare-metal stent to the RCA, tolerating the procedure well. Per discharge summary "He was given a prescription for Effient which he can get free for 30 days. If he has the bypass surgery, it will be discontinued after 30 days. If he does not have the bypass surgery, consider continuing it." His appointment with [facility] is 01/27/15. His stay was further complicated by softer BP limiting med titration for ICM and mildly elevated LFTs improving by time of discharge, likely 2/2 MI. Cath 12/22/14: EF 25%. 2D Echo 12/23/14: EF 25-30%, diffuse hypokinesis, akinesis of the entireinferolateral and inferior myocardium, mild MR.   Past Medical History  Diagnosis Date  . Osteoarthritis   . Coronary artery disease     a. Big NSTEMI 11/2014: cath with surgical disease but patient initially refused CABG. Compromise decision was made to do intervention on the RCA with a bare metal stent with medical therapy for other disease and follow-up as an  outpatient.  . Myocardial infarction     NSTEMI  . HOH (hard of hearing)   . Tobacco abuse   . Ischemic cardiomyopathy     a. a. Cath 12/22/14: EF 25%. b. 2D Echo 12/23/14: EF 25-30%, diffuse hypokinesis, akinesis of the entireinferolateral and inferior myocardium, mild MR.  [name]. Hyperlipidemia   . Transaminitis     Past Surgical History  Procedure Laterality Date  . Left heart catheterization with coronary angiogram N/A 12/22/2014    Procedure: LEFT HEART CATHETERIZATION WITH CORONARY ANGIOGRAM;  Surgeon: Corky CraftsJayadeep S Varanasi, MD; LAD 100%, CFX 90%, RI mild dz, RCA 95%/70%, EF 25%, CABG recommended  . Percutaneous coronary stent intervention (pci-s) N/A 12/23/2014    Procedure: PERCUTANEOUS CORONARY STENT INTERVENTION (PCI-S);  Surgeon: Corky CraftsJayadeep S Varanasi, MD; 3.0 x 24 rebel BMS to the RCA     Current Outpatient Prescriptions  Medication Sig Dispense Refill  . aspirin 81 MG chewable tablet Chew 1 tablet (81 mg total) by mouth daily.    [name]. atorvastatin (LIPITOR) 40 MG tablet Take 1 tablet (40 mg total) by mouth daily. 30 tablet 11  . lisinopril (PRINIVIL,ZESTRIL) 2.5 MG tablet Take 1 tablet (2.5 mg total) by mouth daily. 30 tablet 11  . nicotine (NICODERM CQ - DOSED IN MG/24 HOURS) 14 mg/24hr patch Place 1 patch (14 mg total) onto the skin daily. 28 patch 0  . nitroGLYCERIN (NITROSTAT) 0.4 MG SL tablet Place 1 tablet (0.4 mg total) under the tongue every 5 (five) minutes as needed for chest pain. 25 tablet 3  . prasugrel (  EFFIENT) 10 MG TABS tablet Take 1 tablet (10 mg total) by mouth daily. 30 tablet 11   No current facility-administered medications for this visit.    Allergies:   Penicillins   Social History:  The patient  reports that he has been smoking Cigarettes.  He has a 25 pack-year smoking history. He has never used smokeless tobacco. He reports that he does not drink alcohol or use illicit drugs.   Family History:  The patient's family history includes Hypertension in an other  family member.***  ROS:  Please see the history of present illness. Otherwise, review of systems is positive for ***.   All other systems are reviewed and otherwise negative.   PHYSICAL EXAM: *** VS:  There were no vitals taken for this visit. BMI: There is no weight on file to calculate BMI. Well nourished, well developed, in no acute distress HEENT: normocephalic, atraumatic Neck: no JVD Cardiac:  normal S1, S2; RRR; no murmur Lungs:  clear to auscultation bilaterally, no wheezing, rhonchi or rales Abd: soft, nontender, no hepatomegaly Ext: no edema Skin: warm and dry Neuro:  moves all extremities spontaneously, no focal abnormalities noted  EKG:  ***  Recent Labs: 12/22/2014: B Natriuretic Peptide 91.6; TSH 2.111 12/24/2014: ALT 30 12/28/2014: BUN 5*; Creatinine 0.88; Hemoglobin 13.6; Platelets 231; Potassium 3.9; Sodium 139  12/23/2014: Cholesterol, Total 204*; HDL Cholesterol by NMR 37*; LDL (calc) 141*; Total CHOL/HDL Ratio 5.5; Triglycerides 132; VLDL 26   Estimated Creatinine Clearance: 102.5 mL/min (by C-G formula based on Cr of 0.88).   Wt Readings from Last 3 Encounters:  12/28/14 210 lb (95.255 kg)  12/24/14 210 lb 15.7 oz (95.7 kg)     Other studies reviewed: Additional studies/records reviewed today include:***  ASSESSMENT AND PLAN:  1. ***  Disposition: F/u with ***  CMET today to recheck LFTs, Cr since started on ACEI LFTs/lipids in 8 weeks LifeVest cath site tobaco abuse cvts appt - and what to do with effient  Current medicines are reviewed at length with the patient today.  The patient did not have any concerns regarding medicines.***  Signed, Ronie Spies PA-C 01/01/2015 8:30 AM     [name] [address] [place] [address] [phone] (office)  [phone] (fax)

## 2015-01-01 NOTE — Telephone Encounter (Signed)
See prior phone note.  I was reviewing charts for clinic today and Mr. Casey Jackson was on my schedule. Upon reviewing records of his recent hospitalization, I noted he would be an appropriate candidate for LifeVest. He was not discharged with one and I planned to discuss this at our visit today. I discussed with Dr. SwazilandJordan this morning who felt this would be appropriate if the patient was agreeable. I sent in an order to the LifeVest company to get the ball rolling, pending discussion with the patient. However, Mr. Casey Jackson canceled today's appointment and rescheduled for a later date. I called the patient at home to discuss but got his voicemail. He returned my call and I had the opportunity to speak with him at length about the LifeVest and our reasoning for recommending this, including risk of dangerous arrhythmia, cardiomyopathy, and sudden cardiac death. At this time he elected not to make a decision and wants to give it some more thought. I reiterated risks of waiting but he did not want to commit at this time. I asked him to let our office know as soon as possible if he wants to proceed. He verbalized understanding and gratitude, states he is doing well recently and compliant with all meds. Will fax this note to [employer].  Ronie Spiesayna Dunn PA-C

## 2015-01-01 NOTE — Progress Notes (Signed)
This encounter was created in error - please disregard.

## 2015-01-01 NOTE — Telephone Encounter (Signed)
I was reviewing charts for clinic today and Mr. Casey Jackson was on my schedule. Upon reviewing records of his recent hospitalization, I noted he would be an appropriate candidate for LifeVest. I do not see this was discussed with the patient in the hospital. I planned to do so at our visit today. I discussed with Dr. SwazilandJordan this morning who felt this would be appropriate if the patient was agreeable. I sent in an order to the LifeVest company to get the ball rolling, pending discussion with the patient. However, Mr. Casey Jackson canceled today's appointment and rescheduled for a later date. I called him at home to discuss the possibility of LifeVest further (as it could be fit at home if needed) but got his voicemail. I left a message on his machine to please give our office a call back. If he decides to go forward with LifeVest, we will need to fax a copy of these telephone notes to [employer] to confirm my involvement in the patient's care since Dr. SwazilandJordan is out of the office today. (The name on the order must correspond with a provider involved in the patient's care.) Anell BarrGlen Lowe and Dennis BastKelly Lanier were made aware of the patient's cancellation and I will keep them updated with the outcome. Arlyn Buerkle PA-C

## 2015-01-04 ENCOUNTER — Telehealth: Payer: Self-pay | Admitting: Cardiology

## 2015-01-04 ENCOUNTER — Telehealth: Payer: Self-pay | Admitting: Physician Assistant

## 2015-01-04 NOTE — Telephone Encounter (Signed)
Error

## 2015-01-04 NOTE — Telephone Encounter (Signed)
New Message         Pt calling stating he is trying to quit smoking and needs a rx called in to [facility] on [address] for Chantix as long as it doesn't interfere with his other meds. Please call back and advise.

## 2015-01-05 ENCOUNTER — Other Ambulatory Visit: Payer: Self-pay

## 2015-01-05 MED ORDER — VARENICLINE TARTRATE 0.5 MG X 11 & 1 MG X 42 PO MISC: ORAL | Status: DC

## 2015-01-05 MED ORDER — VARENICLINE TARTRATE 1 MG PO TABS: 1.0000 mg | ORAL_TABLET | Freq: Two times a day (BID) | ORAL | Status: DC

## 2015-01-05 NOTE — Telephone Encounter (Signed)
Returned call to patient no answer.Left message on personal voice mail Dr.Jordan advised ok to try Chantix.Chantix prescription sent to pharmacy.

## 2015-01-06 ENCOUNTER — Other Ambulatory Visit: Payer: Self-pay

## 2015-01-13 ENCOUNTER — Ambulatory Visit (HOSPITAL_COMMUNITY)
Admission: RE | Admit: 2015-01-13 | Discharge: 2015-01-13 | Disposition: A | Discharge status: Home / Self Care | Payer: Medicare Other | Source: Ambulatory Visit | Attending: Internal Medicine | Admitting: Internal Medicine

## 2015-01-13 ENCOUNTER — Other Ambulatory Visit (HOSPITAL_COMMUNITY): Payer: Self-pay | Admitting: Internal Medicine

## 2015-01-13 ENCOUNTER — Ambulatory Visit (HOSPITAL_COMMUNITY)
Admission: RE | Admit: 2015-01-13 | Discharge: 2015-01-13 | Disposition: A | Discharge status: Home / Self Care | Payer: Medicare Other | Attending: Emergency Medicine | Admitting: Emergency Medicine

## 2015-01-13 DIAGNOSIS — M1612 Unilateral primary osteoarthritis, left hip: Secondary | ICD-10-CM | POA: Insufficient documentation

## 2015-01-13 DIAGNOSIS — R52 Pain, unspecified: Secondary | ICD-10-CM

## 2015-01-13 DIAGNOSIS — M25562 Pain in left knee: Secondary | ICD-10-CM | POA: Insufficient documentation

## 2015-01-15 ENCOUNTER — Ambulatory Visit: Payer: Medicare Other | Admitting: Physician Assistant

## 2015-01-18 ENCOUNTER — Telehealth: Payer: Self-pay

## 2015-01-18 NOTE — Telephone Encounter (Signed)
Patient called insurance will not cover Chantix.

## 2015-01-26 ENCOUNTER — Ambulatory Visit (INDEPENDENT_AMBULATORY_CARE_PROVIDER_SITE_OTHER): Payer: Medicare Other | Admitting: Physician Assistant

## 2015-01-26 ENCOUNTER — Encounter: Payer: Self-pay | Admitting: Physician Assistant

## 2015-01-26 VITALS — BP 128/70 | HR 77 | Ht 71.0 in | Wt 210.1 lb

## 2015-01-26 DIAGNOSIS — Z9861 Coronary angioplasty status: Secondary | ICD-10-CM

## 2015-01-26 DIAGNOSIS — I2510 Atherosclerotic heart disease of native coronary artery without angina pectoris: Secondary | ICD-10-CM

## 2015-01-26 DIAGNOSIS — E785 Hyperlipidemia, unspecified: Secondary | ICD-10-CM

## 2015-01-26 DIAGNOSIS — I214 Non-ST elevation (NSTEMI) myocardial infarction: Secondary | ICD-10-CM

## 2015-01-26 DIAGNOSIS — Z599 Problem related to housing and economic circumstances, unspecified: Secondary | ICD-10-CM

## 2015-01-26 DIAGNOSIS — I255 Ischemic cardiomyopathy: Secondary | ICD-10-CM

## 2015-01-26 DIAGNOSIS — Z598 Other problems related to housing and economic circumstances: Secondary | ICD-10-CM

## 2015-01-26 DIAGNOSIS — I222 Subsequent non-ST elevation (NSTEMI) myocardial infarction: Secondary | ICD-10-CM

## 2015-01-26 DIAGNOSIS — Z720 Tobacco use: Secondary | ICD-10-CM

## 2015-01-26 LAB — COMPREHENSIVE METABOLIC PANEL
ALBUMIN: 4.5 g/dL (ref 3.5–5.2)
ALK PHOS: 77 U/L (ref 39–117)
ALT: 16 U/L (ref 0–53)
AST: 15 U/L (ref 0–37)
BILIRUBIN TOTAL: 0.8 mg/dL (ref 0.2–1.2)
BUN: 10 mg/dL (ref 6–23)
CALCIUM: 9.2 mg/dL (ref 8.4–10.5)
CO2: 31 mEq/L (ref 19–32)
CREATININE: 1.07 mg/dL (ref 0.40–1.50)
Chloride: 103 mEq/L (ref 96–112)
GFR: 74.24 mL/min (ref >60.00)
Glucose, Bld: 102 mg/dL — ABNORMAL HIGH (ref 70–99)
Potassium: 3.8 mEq/L (ref 3.5–5.1)
Sodium: 137 mEq/L (ref 135–145)
TOTAL PROTEIN: 7.5 g/dL (ref 6.0–8.3)

## 2015-01-26 LAB — LIPID PANEL
Cholesterol: 122 mg/dL (ref 0–200)
HDL: 31.5 mg/dL — ABNORMAL LOW (ref >39.00)
LDL Cholesterol: 64 mg/dL (ref 0–99)
NONHDL: 90.5
Total CHOL/HDL Ratio: 4
Triglycerides: 132 mg/dL (ref 0.0–149.0)
VLDL: 26.4 mg/dL (ref 0.0–40.0)

## 2015-01-26 MED ORDER — CARVEDILOL 3.125 MG PO TABS: 3.1250 mg | ORAL_TABLET | Freq: Two times a day (BID) | ORAL | Status: DC

## 2015-01-26 NOTE — Addendum Note (Signed)
Addended by: Oleta MouseVERTON, SHANA M on: 01/26/2015 12:14 PM   Modules accepted: Orders

## 2015-01-26 NOTE — Progress Notes (Signed)
Cardiology Office Note Date:  01/26/2015  Patient ID:  Casey, Jackson 1951-01-09, MRN [id number] PCP:  Dorrene German, MD  Cardiologist:  Dr. Swaziland   Chief Complaint: follow-up of CAD and recent MI  History of Present Illness: Casey Jackson is a 64 y.o. male with history of recently diagnosed CAD/NSTEMI, HLD, ICM, tobacco abuse who presents for post-hospital follow-up. He was admitted 1/26-1/28/16 with large NSTEMI with peak troponin >80. He was found to have surgical disease by cath with initial plans for CABG 12/24/14. However, on 12/23/2014, the patient stated he realized he would need extensive rehabilitation after the surgery and had to go home and take care of some things beforehand. He adamantly refused surgery and almost left AMA. Therefore, a compromise decision was made to do intervention on the RCA with a bare metal stent with medical therapy for other disease and follow-up as an outpatient. On 01/27, he was taken back to the cath lab and had a bare-metal stent to the RCA, tolerating the procedure well. Per discharge summary "He was given a prescription for Effient which he can get free for 30 days. If he has the bypass surgery, it will be discontinued after 30 days. If he does not have the bypass surgery, consider continuing it." His appointment with [facility] is 01/27/15. His stay was further complicated by softer BP limiting med titration for ICM and mildly elevated LFTs improving by time of discharge, likely 2/2 MI. Cath 12/22/14: EF 25%. 2D Echo 12/23/14: EF 25-30%, diffuse hypokinesis, akinesis of the entireinferolateral and inferior myocardium, mild MR. He was scheduled to f/u with me post-hospital 01/01/15 but he cancelled that appointment and instead rescheduled to today. I had reviewed his chart and felt he was appropriate for LifeVest thus placed the order, but the patient declined.  Since discharge he has made a lot of lifestyle changes. His prior diet consisted of fast food, bologna  sandwiches, bags of potato chips. He now focuses on eating fresh fruits and vegetables along with skinless chicken breast and other lean meats. He couldn't afford Chantix (insurance wouldn't cover) but has been able to cut down to 1 cigarette every 6 hours and seems motivated to quit. His copay on his other medications was much more affordable - was able to get Effient for $5. He says he feels the best he's felt in years. Able to walk several blocks to the pharmacy without any chest pain or dyspnea. No CP, SOB, nausea, diaphoresis, palpitations or syncope.   Past Medical History  Diagnosis Date  . Osteoarthritis   . Coronary artery disease     a. Big NSTEMI 11/2014: cath with surgical disease but patient initially refused CABG. Compromise decision was made to do intervention on the RCA with a bare metal stent with medical therapy for other disease and follow-up as an outpatient.  . Myocardial infarction     NSTEMI  . HOH (hard of hearing)   . Tobacco abuse   . Ischemic cardiomyopathy     a. a. Cath 12/22/14: EF 25%. b. 2D Echo 12/23/14: EF 25-30%, diffuse hypokinesis, akinesis of the entireinferolateral and inferior myocardium, mild MR.  [name] Hyperlipidemia   . Transaminitis     Past Surgical History  Procedure Laterality Date  . Left heart catheterization with coronary angiogram N/A 12/22/2014    Procedure: LEFT HEART CATHETERIZATION WITH CORONARY ANGIOGRAM;  Surgeon: Corky Crafts, MD; LAD 100%, CFX 90%, RI mild dz, RCA 95%/70%, EF 25%, CABG recommended  . Percutaneous coronary stent  intervention (pci-s) N/A 12/23/2014    Procedure: PERCUTANEOUS CORONARY STENT INTERVENTION (PCI-S);  Surgeon: Corky Crafts, MD; 3.0 x 24 rebel BMS to the RCA     Current Outpatient Prescriptions  Medication Sig Dispense Refill  . aspirin 81 MG chewable tablet Chew 1 tablet (81 mg total) by mouth daily.    [name] atorvastatin (LIPITOR) 40 MG tablet Take 1 tablet (40 mg total) by mouth daily. 30 tablet 11  .  lisinopril (PRINIVIL,ZESTRIL) 2.5 MG tablet Take 1 tablet (2.5 mg total) by mouth daily. 30 tablet 11  . nitroGLYCERIN (NITROSTAT) 0.4 MG SL tablet Place 1 tablet (0.4 mg total) under the tongue every 5 (five) minutes as needed for chest pain. 25 tablet 3  . prasugrel (EFFIENT) 10 MG TABS tablet Take 1 tablet (10 mg total) by mouth daily. 30 tablet 11   No current facility-administered medications for this visit.    Allergies:   Penicillins   Social History:  The patient  reports that he has been smoking Cigarettes.  He has a 25 pack-year smoking history. He has never used smokeless tobacco. He reports that he does not drink alcohol or use illicit drugs.   Family History:  The patient's family history includes Hypertension in an other family member.  ROS:  Please see the history of present illness.  All other systems are reviewed and otherwise negative.   PHYSICAL EXAM:  VS:  BP 128/70 mmHg  Pulse 77  Ht  (1.803 m)  Wt 210 lb 1.9 oz (95.31 kg)  BMI 29.32 kg/m2 BMI: Body mass index is 29.32 kg/(m^2). Well nourished, well developed, in no acute distress HEENT: normocephalic, atraumatic Neck: no JVD Cardiac:  normal S1, S2; RRR; no murmur Lungs:  clear to auscultation bilaterally, no wheezing, rhonchi or rales Abd: soft, nontender, no hepatomegaly Ext: no edema, right radial cath site well-healed without hematoma or ecchymosis; good pulse Skin: warm and dry Neuro:  moves all extremities spontaneously, no focal abnormalities noted  EKG:  NSR 77bpm inferolateral ST-T changes, QTc , left axis deviation, NSIVCD QRS  Recent Labs: 12/22/2014: B Natriuretic Peptide 91.6; TSH 2.111 12/24/2014: ALT 30 12/28/2014: BUN 5*; Creatinine 0.88; Hemoglobin 13.6; Platelets 231; Potassium 3.9; Sodium 139  12/23/2014: Cholesterol, Total 204*; HDL-C 37*; LDL (calc) 141*; Total CHOL/HDL Ratio 5.5; Triglycerides 132; VLDL 26   CrCl cannot be calculated (Patient has no serum creatinine  result on file.).   Wt Readings from Last 3 Encounters:  01/26/15 210 lb 1.9 oz (95.31 kg)  12/28/14 210 lb (95.255 kg)  12/24/14 210 lb 15.7 oz (95.7 kg)     Other studies reviewed: Additional studies/records reviewed today include: summarized above  ASSESSMENT AND PLAN:  1. CAD with recent NSTEMI s/p BMS to RCA with possible plans for eventual CABG - I encouraged him to keep his appointment with cardiac surgeon tomorrow. This will help [place] determine what our plans are as far as continuing Effient. From the standpoint of his bare metal stent he is now outside the 30-day window to stop Effient; however, given the magnitude of his MI the CURE trial would suggest he may benefit from longer duration of DAPT. Will continue ASA and Effient for now, awaiting decision regarding surgery. Continue statin. Add BB as below. 2. Ischemic cardiomyopathy EF 25-30% - euvolemic by exam today. We again discussed LifeVest and he again declined. Continue ACEI. Will add low dose BB (this was held off in the hospital due to soft BPs which seem to  have improved). Await plan from [facility] standpoint to guide when we should repeat his echo to determine candidacy for ICD.  3. Hyperlipidemia - will check CMET/lipids today. Continue statin and consider titration if not at goal. 4. Tobacco abuse - encouraged him to keep trying to quit. He seems motivated. He is not willing to pay out of pocket for Chantix due to fixed income. I offered him other medication strategy (i.e. Zyban) but he did not seem interested at this time.  Disposition: F/u with myself or Dr. SwazilandJordan in 1 month. He also requests referral to social worker/care management to see about food assistance programs given fixed income (he finds that eating healthier is more expensive than his old eating habits).  Current medicines are reviewed at length with the patient today.  The patient did not have any concerns regarding medicines.  Thomasene MohairSigned, Dayna Dunn PA-C 01/26/2015  11:53 AM     [facility] [address] [place] [address] [phone]) [phone] (office)  [phone]) [phone] (fax)

## 2015-01-26 NOTE — Patient Instructions (Addendum)
START TAKING COREG  3.125MG  TWICE A DAY    LABS TODAY CMET AND LIPIDS    FOLLOW UP WITH DR SwazilandJORDAN IN ONE MONTH OR DANA DUNN   PT NEEDS A REFERRAL TO SOCIAL WORKER Lavinia Sharps/CARE  MANAGER FOR FINANCIAL DIFFICULTIES FOR MEDICAL BILLS   PLEASE LET DANA DUNN KNOW WHAT THE CARDIAC SURGEONS PLAN IS SO ADJUSTMENTS CAN BE MADE TO CARE PLAN FROM [facility] IF NEED TO

## 2015-01-27 ENCOUNTER — Ambulatory Visit: Payer: Medicare Other | Admitting: Surgery

## 2015-01-27 ENCOUNTER — Telehealth: Payer: Self-pay | Admitting: *Deleted

## 2015-01-27 NOTE — Telephone Encounter (Signed)
Patient called and wanted to cancel his appt to see Dr. Laneta SimmersBartle today, 3/2 and move it out a month.  He says he feels the best he has felt in years and doesn't want to be seen at this time.  We have moved his appt to 4/6.  I have told him he still has tight blockages and is at increased risk if he waits another month.  He understand and doesn't want to be seen any sooner.  Dr. Laneta SimmersBartle is aware.

## 2015-01-28 ENCOUNTER — Telehealth: Payer: Self-pay | Admitting: *Deleted

## 2015-01-28 NOTE — Telephone Encounter (Signed)
Notified of lab results. 

## 2015-01-28 NOTE — Telephone Encounter (Signed)
Follow up  Pt returning Carol's phone call. Please call back and discuss.

## 2015-01-28 NOTE — Telephone Encounter (Signed)
lmptcb to go over lab results 

## 2015-02-05 ENCOUNTER — Telehealth: Payer: Self-pay | Admitting: Cardiology

## 2015-02-05 NOTE — Telephone Encounter (Signed)
New Message        Pt calling stating that he is trying to get [employer] services and he needs for our office to fill out a portion of it. Please call pt back once form is completed to let him know when it is ready to be picked up.

## 2015-02-05 NOTE — Telephone Encounter (Signed)
Returned call to patient no answer.LMTC. 

## 2015-02-08 NOTE — Telephone Encounter (Signed)
Returned call to patient he stated he will have his aide to bring form for [employer] to office for Dr.Jordan's signature.

## 2015-02-09 ENCOUNTER — Telehealth: Payer: Self-pay | Admitting: Cardiology

## 2015-02-09 NOTE — Telephone Encounter (Signed)
Walk In pt form " [employer] " dropped Off sent interoffice to [address] office for Dr. SwazilandJordan to complete

## 2015-02-10 NOTE — Telephone Encounter (Signed)
Returned call to patient received [employer] form,but missing page for Dr.Jordan to fill out and sign.Patient stated he will bring missing page to office 02/11/15.

## 2015-02-19 NOTE — Telephone Encounter (Signed)
Received page 3. [employer] form completed and faxed back at fax # [phone].

## 2015-02-22 ENCOUNTER — Telehealth: Payer: Self-pay | Admitting: Cardiology

## 2015-02-22 NOTE — Telephone Encounter (Signed)
Returned call to patient no answer.LMTC. 

## 2015-02-22 NOTE — Telephone Encounter (Signed)
Please call,said you were to send some paperwork to [employer]. Wants to know if you did it please?

## 2015-02-22 NOTE — Telephone Encounter (Signed)
Received call from patient he [facility].Paper work re faxed to her at fax # [phone].

## 2015-02-26 ENCOUNTER — Telehealth: Payer: Self-pay | Admitting: Cardiology

## 2015-02-26 NOTE — Telephone Encounter (Signed)
Called, informed pt Casey MaxwellCheryl out of office until next week. 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

## 2015-02-26 NOTE — Telephone Encounter (Signed)
Patient is calling wanting to speak with Casey Jackson.  Would not leave any information.

## 2015-03-01 ENCOUNTER — Ambulatory Visit (INDEPENDENT_AMBULATORY_CARE_PROVIDER_SITE_OTHER): Payer: Medicare Other | Admitting: Physician Assistant

## 2015-03-01 ENCOUNTER — Encounter: Payer: Self-pay | Admitting: Physician Assistant

## 2015-03-01 VITALS — BP 120/70 | HR 63 | Ht 71.0 in | Wt 199.0 lb

## 2015-03-01 DIAGNOSIS — Z720 Tobacco use: Secondary | ICD-10-CM | POA: Diagnosis not present

## 2015-03-01 DIAGNOSIS — E785 Hyperlipidemia, unspecified: Secondary | ICD-10-CM | POA: Diagnosis not present

## 2015-03-01 DIAGNOSIS — I2510 Atherosclerotic heart disease of native coronary artery without angina pectoris: Secondary | ICD-10-CM

## 2015-03-01 DIAGNOSIS — I255 Ischemic cardiomyopathy: Secondary | ICD-10-CM

## 2015-03-01 MED ORDER — METOPROLOL SUCCINATE ER 25 MG PO TB24: 12.5000 mg | ORAL_TABLET | Freq: Every day | ORAL | Status: DC

## 2015-03-01 NOTE — Progress Notes (Signed)
Cardiology Office Note   Date:  03/01/2015   ID:  Casey Jackson, DOB May 11, 1951, MRN [id number]  PCP:  Dorrene German, MD  Cardiologist:  Dr. Peter Swaziland     Chief Complaint  Patient presents with  . Coronary Artery Disease     History of Present Illness: Casey Jackson is a 64 y.o. male with a hx of CAD, ischemic cardiomyopathy, hyperlipidemia, tobacco abuse. He was admitted in 11/2014 with a non-STEMI. Cardiac catheterization demonstrated 3 vessel CAD. CABG was recommended. However, the patient refused. He was taken back to the Cath Lab and underwent PCI of the RCA with a bare metal stent. He was to follow-up with cardiothoracic surgery for ultimate CABG. Echocardiogram demonstrated reduced EF at 25-30%. LifeVest was recommended. The patient declined. He was seen in the office by Ronie Spies, PA-C 01/26/15.  He was maintained on aspirin and Effient. He remained on ACE inhibitor. Beta blocker was added to his medical regimen.  The patient returns for follow-up. He has not followed up with Dr. Laneta Simmers. He has a lot of issues with transportation. His appointment is later this week. Overall, he has continued to do well. He has changed his lifestyle and eating better. He has almost completely quit smoking cigarettes. He no longer drinks beer. He has lost about 21 pounds.  He's been exercising without exertional chest pain or shortness of breath. He did try to take carvedilol for a couple of weeks. This seemed to cause him to cough. He also had some dull chest pain associated with this. These symptoms resolved after stopping the medication. He denies any orthopnea, PND or edema. He denies syncope.  Studies/Reports Reviewed Today:  Carotid US 12/24/14 Bilateral: 1-39% ICA stenosis  Echo 12/23/14 - EF 25% to 30%. Diffuse hypokinesis. There is akinesis of the entire inferolateral and inferior myocardium. - Mitral valve: There was mild regurgitation. - Atrial septum: No defect or patent foramen ovale  was identified.  Cardiac cath/PCI 11/2014 LM:  widely patent. LAD:  occluded at the ostium. There are left to left collaterals which feed a large, branching diagonal vessel.  LCx:   90% and significant disease in the proximal portion of the continuation of the circumflex.   RCA:   focal 95% stenosis. In the distal RCA, just before the bifurcation of the posterior lateral artery and posterior descending artery, there is a 70% stenosis. In the posterior lateral artery, there is a 70% stenosis in the midportion of this vessel, before a large bifurcation. The posterior descending artery is very large and feeds the apex. There are only minimal right-to-left collaterals noted. EF 25 %.  There is basal to mid inferior hypokinesis. The anterior wall does contract. LVEDP was 26 mmHg.  Successful PCI of the mid right coronary artery (95% stenosis to 0%) with bare metal stent, 3.0 x 24 rebel, postdilated to greater than 3.5 mm in diameter.  There is still disease in the distal RCA which would be better treated by bypass.  The posterior lateral artery looks free of disease. SVG to PDA may be sufficient to revascularize the RCA territory.   Past Medical History  Diagnosis Date  . Osteoarthritis   . Coronary artery disease     a. Big NSTEMI 11/2014: cath with surgical disease but patient initially refused CABG. Compromise decision was made to do intervention on the RCA with a bare metal stent with medical therapy for other disease and follow-up as an outpatient.  . Myocardial infarction  NSTEMI  . HOH (hard of hearing)   . Tobacco abuse   . Ischemic cardiomyopathy     a. a. Cath 12/22/14: EF 25%. b. 2D Echo 12/23/14: EF 25-30%, diffuse hypokinesis, akinesis of the entireinferolateral and inferior myocardium, mild MR.  [name]. Hyperlipidemia   . Transaminitis     Past Surgical History  Procedure Laterality Date  . Left heart catheterization with coronary angiogram N/A 12/22/2014    Procedure: LEFT HEART  CATHETERIZATION WITH CORONARY ANGIOGRAM;  Surgeon: Corky CraftsJayadeep S Varanasi, MD; LAD 100%, CFX 90%, RI mild dz, RCA 95%/70%, EF 25%, CABG recommended  . Percutaneous coronary stent intervention (pci-s) N/A 12/23/2014    Procedure: PERCUTANEOUS CORONARY STENT INTERVENTION (PCI-S);  Surgeon: Corky CraftsJayadeep S Varanasi, MD; 3.0 x 24 rebel BMS to the RCA      Current Outpatient Prescriptions  Medication Sig Dispense Refill  . aspirin 81 MG chewable tablet Chew 1 tablet (81 mg total) by mouth daily.    [name]. atorvastatin (LIPITOR) 40 MG tablet Take 1 tablet (40 mg total) by mouth daily. 30 tablet 11  . carvedilol (COREG) 3.125 MG tablet Take 1 tablet (3.125 mg total) by mouth 2 (two) times daily with a meal. 60 tablet 6  . lisinopril (PRINIVIL,ZESTRIL) 2.5 MG tablet Take 1 tablet (2.5 mg total) by mouth daily. 30 tablet 11  . nitroGLYCERIN (NITROSTAT) 0.4 MG SL tablet Place 1 tablet (0.4 mg total) under the tongue every 5 (five) minutes as needed for chest pain. 25 tablet 3  . prasugrel (EFFIENT) 10 MG TABS tablet Take 1 tablet (10 mg total) by mouth daily. 30 tablet 11   No current facility-administered medications for this visit.    Allergies:   Penicillins    Social History:  The patient  reports that he has been smoking Cigarettes.  He has a 25 pack-year smoking history. He has never used smokeless tobacco. He reports that he does not drink alcohol or use illicit drugs.   Family History:  The patient's family history includes Cancer in his father; Hypertension in an other family member. There is no history of Heart attack or Stroke.    ROS:   Please see the history of present illness.   Review of Systems  Neurological: Positive for dizziness.  All other systems reviewed and are negative.    PHYSICAL EXAM: VS:  BP 120/70 mmHg  Pulse 63  Ht 5\' 11"  (1.803 m)  Wt 199 lb (90.266 kg)  BMI 27.77 kg/m2    Wt Readings from Last 3 Encounters:  03/01/15 199 lb (90.266 kg)  01/26/15 210 lb 1.9 oz (95.31  kg)  12/28/14 210 lb (95.255 kg)     GEN: Well nourished, well developed, in no acute distress HEENT: normal Neck: no JVD, no masses Cardiac:  Normal S1/S2, RRR; no murmur ,  no rubs or gallops, no edema  Respiratory:   Decreased breath sounds bilaterally, no wheezing, rhonchi or rales. GI: soft, nontender, nondistended, + BS MS: no deformity or atrophy Skin: warm and dry  Neuro:  CNs II-XII intact, Strength and sensation are intact Psych: Normal affect   EKG:  EKG is ordered today.  It demonstrates:   NSR, HR 63, LAD, inferolateral T-wave inversions, no change from prior tracing   Recent Labs: 12/22/2014: B Natriuretic Peptide 91.6; TSH 2.111 12/28/2014: Hemoglobin 13.6; Platelets 231 01/26/2015: ALT 16; BUN 10; Creatinine 1.07; Potassium 3.8; Sodium 137    Lipid Panel    Component Value Date/Time   CHOL 122 01/26/2015 1243  TRIG 132.0 01/26/2015 1243   HDL 31.50* 01/26/2015 1243   CHOLHDL 4 01/26/2015 1243   VLDL 26.4 01/26/2015 1243   LDLCALC 64 01/26/2015 1243      ASSESSMENT AND PLAN:  Coronary artery disease involving native coronary artery of native heart without angina pectoris He remains stable without significant symptoms of angina. He does have significant residual CAD that was felt to benefit from CABG. He sees Dr. Laneta Simmers later this week. I have recommended that he keep this appointment. He remains on aspirin and Effient. For now, I would recommend he remain on these. Also, continue ACE inhibitor, statin. I will change him to a different beta blocker as outlined below.  Cardiomyopathy, ischemic He had some issues with carvedilol. Continue lisinopril 2.5 mg daily. Start Toprol-XL 12.5 mg daily.  Hyperlipidemia LDL goal <70 Continue statin. Recent LDL optimal.  Tobacco abuse  He continues to try to quit.   Current medicines are reviewed at length with the patient today.  The patient has concerns regarding medicines.  The following changes have been made:     DC carvedilol  Start Toprol-XL 12.5 mg daily   Labs/ tests ordered today include:   Orders Placed This Encounter  Procedures  . EKG 12-Lead    Disposition:   FU with Dr. Peter Swaziland 4 weeks.  Given his difficulties with transportation, I did offer him follow-up here in this office with Dr. Eldridge Dace, who did his cardiac catheterization and PCI. However, he prefers to try to get back to see Dr. Swaziland in the [facility] office.   Signed, Brynda Rim, MHS 03/01/2015 12:00 PM    [facility] [address] [place], [place], [place]  [address] Phone: [phone]; Fax: [phone]

## 2015-03-01 NOTE — Patient Instructions (Addendum)
FOLLOW UP WITH DR. SwazilandJORDAN IN 1 MONTH  STOP COREG  START TOPROL XL 25 MG TABLET; YOUR DIRECTIONS WILL BE TO TAKE 1/2 TAB DAILY = 12.5 MG DAILY' RX SENT IN

## 2015-03-02 NOTE — Telephone Encounter (Signed)
Returning your call. °

## 2015-03-02 NOTE — Telephone Encounter (Signed)
Returned call to patient no answer.LMTC. 

## 2015-03-02 NOTE — Telephone Encounter (Signed)
Received a call from patient he stated he he [facility].Stated she wanted to know why he cannot walk up a hill.Stated he wanted me to call her and explain.[facility] at # [phone] no answer.Left message on her personal voice mail patient cannot walk up hill he gets sob and has pain in both hips and knees.Advised to call me back if she has any more questions.

## 2015-03-03 ENCOUNTER — Encounter: Payer: Self-pay | Admitting: Surgery

## 2015-03-03 ENCOUNTER — Ambulatory Visit (INDEPENDENT_AMBULATORY_CARE_PROVIDER_SITE_OTHER): Payer: Medicare Other | Admitting: Surgery

## 2015-03-03 VITALS — BP 116/68 | HR 67 | Resp 20 | Ht 71.0 in | Wt 205.0 lb

## 2015-03-03 DIAGNOSIS — I255 Ischemic cardiomyopathy: Secondary | ICD-10-CM | POA: Diagnosis not present

## 2015-03-03 DIAGNOSIS — I25119 Atherosclerotic heart disease of native coronary artery with unspecified angina pectoris: Secondary | ICD-10-CM | POA: Diagnosis not present

## 2015-03-03 NOTE — Progress Notes (Signed)
     HPI:  The patient returns today for reevaluation for CABG after undergoing PCI of the RCA with a BMS on 12/23/2014. He was sent home on Eliquis and the plan was to do CABG after one month due to the presence of severe 3 vessel CAD with severe LV dysfunction. He was suppose to return to see me sooner but has transportation problems that have prevented that. He says that he has been watching his diet closely and has lost 20 lbs. He has been walking daily and says he feels great with no chest pain or shortness of breath and no fatigue. He does not want to proceed with CABG at this time since he feels so good.  Current Outpatient Prescriptions  Medication Sig Dispense Refill  . aspirin 81 MG chewable tablet Chew 1 tablet (81 mg total) by mouth daily.    [name]. atorvastatin (LIPITOR) 40 MG tablet Take 1 tablet (40 mg total) by mouth daily. 30 tablet 11  . lisinopril (PRINIVIL,ZESTRIL) 2.5 MG tablet Take 1 tablet (2.5 mg total) by mouth daily. 30 tablet 11  . metoprolol succinate (TOPROL-XL) 25 MG 24 hr tablet Take 0.5 tablets (12.5 mg total) by mouth daily. 30 tablet 11  . nitroGLYCERIN (NITROSTAT) 0.4 MG SL tablet Place 1 tablet (0.4 mg total) under the tongue every 5 (five) minutes as needed for chest pain. 25 tablet 3  . prasugrel (EFFIENT) 10 MG TABS tablet Take 1 tablet (10 mg total) by mouth daily. 30 tablet 11   No current facility-administered medications for this visit.     Physical Exam: BP 116/68 mmHg  Pulse 67  Resp 20  Ht 5\' 11"  (1.803 m)  Wt 205 lb (92.987 kg)  BMI 28.60 kg/m2  SpO2 98% He looks well Cardiac exam shows a regular rate and rhythm with normal heart sounds  Lung exam is clear There is no peripheral edema  Diagnostic Tests:  none  Impression:  He seems to be doing well clinically since his RCA PCI. He still has an occluded LAD that has faint collaterals from the left, an OM2 with a 90% stenosis and 70% stenosis distally in the RCA at the bifurcation of the  PDA and PL as well as in the PL itself. He would like to continue medical therapy for now and is going to follow up with Dr. SwazilandJordan in June. He understands the importance of continuing the Eliquis and ASA.  Plan:  He will follow up with Dr. SwazilandJordan in June. I will be happy to see him back to schedule CABG if he changes his mind or develops recurrent symptoms.   Alleen BorneBryan K Israa Caban, MD [facility] [phone]

## 2015-03-08 ENCOUNTER — Ambulatory Visit: Payer: Medicare Other | Admitting: Cardiology

## 2015-03-18 ENCOUNTER — Telehealth: Payer: Self-pay | Admitting: Cardiology

## 2015-03-18 NOTE — Telephone Encounter (Signed)
Returned call to patient he stated he continues to have episodes of dizziness,light headed,dull chest pain.Stated he notices these episodes couple of hours after he eats.Stated he thought coreg was causing and coreg was changed to toprol 12.5 mg daily.Stated he continues to have episodes.Stated he would like to see Dr.Jordan.Stated he is thinking he wants to go ahead and have heart surgery.Appointment scheduled with Dr.Jordan 03/24/15 at 11:45 am.Advised to go to ER if needed.

## 2015-03-18 NOTE — Telephone Encounter (Signed)
Please call,pt thinks he is having a reaction to some of his medicine.

## 2015-03-24 ENCOUNTER — Ambulatory Visit: Payer: Medicare Other | Admitting: Cardiology

## 2015-04-01 ENCOUNTER — Telehealth: Payer: Self-pay | Admitting: Cardiology

## 2015-04-01 MED ORDER — OMEPRAZOLE 40 MG PO CPDR: 40.0000 mg | DELAYED_RELEASE_CAPSULE | Freq: Every day | ORAL | Status: DC

## 2015-04-01 NOTE — Telephone Encounter (Signed)
Re: Med reaction?  Pt concerned, stating "sick on stomach", unable to eat, poor energy, continues to have dizzy spells.  This has been ongoing ~ 1 month - He thinks the atorvastatin is to blame. 2 weeks ago he was switched from coreg to toprol, states he continued to have symptoms. Compliant w/ metoprolol, effient, all current meds.  Pt cites concern for weight loss, recent home scale weight 185. He was ~200lbs  1 month ago. He reports able to tolerate eating about 1 meal a day, is doing his best to adhere to heart healthy/low salt diet.  Pt aware of appt w Vernona RiegerLaura later in the month. Would like any recommendations that would be helpful prior to appt. Informed him I would defer to Dr. Swazil[place]Jordan - unsure if it would be OK to discontinue atorvastatin w/ recent MI.

## 2015-04-01 NOTE — Telephone Encounter (Signed)
Called pt w/ instructions. Changes made to med list, prilosec rx sent to preferred pharm. Pt voiced understanding w/ given instructions.

## 2015-04-01 NOTE — Telephone Encounter (Signed)
Recommend prilosec 40 mg daily. Can reduce lipitor to 20 mg for now.  Jisel Fleet [place]Jordan MD, [facility]

## 2015-04-01 NOTE — Telephone Encounter (Signed)
Pt says he is having a reaction to Atorvastatin. He would to change to something else please.

## 2015-04-01 NOTE — Addendum Note (Signed)
Addended by: Salley SlaughterOSE, Kiyon Fidalgo R on: 04/01/2015 05:00 PM   Modules accepted: Orders, Medications

## 2015-04-05 ENCOUNTER — Telehealth: Payer: Self-pay | Admitting: Cardiology

## 2015-04-05 NOTE — Telephone Encounter (Signed)
It would be quite unusual for lipitor to be causing these symptoms. It sounds as if he may be having issues with his BP or rhythm which lipitor would not cause. He will need to be seen to evaluate. ? What happened to appt. Scheduled for 4/27. He may hold lipitor for now but will need follow up soon.  Egbert Seidel [place]Jordan MD, [facility]

## 2015-04-05 NOTE — Telephone Encounter (Signed)
Mr.Stader is calling because he is still having a reaction to the medication( Atorvastatin 40mg ) even after taking a half a pill he is still having the same reaction . Please call   Thanks

## 2015-04-05 NOTE — Telephone Encounter (Signed)
Returned call to patient he stated is unable to take Atorvastatin 20 mg.Stated makes him feel crazy in his head,dizziness.Message sent to Dr.Jordan for advice.

## 2015-04-06 ENCOUNTER — Telehealth: Payer: Self-pay | Admitting: Cardiology

## 2015-04-06 NOTE — Telephone Encounter (Signed)
Returned call to patient no answer.LMTC. 

## 2015-04-06 NOTE — Telephone Encounter (Signed)
See previous 04/06/15 note.

## 2015-04-06 NOTE — Telephone Encounter (Signed)
Returned call to patient he stated he did not take atorvastatin last night and feels better.Dr.Jordan advised unusual for atorvastatin to cause these symptoms.Advised ok to continue to hold.Advised keep appointment Nada BoozerLaura Ingold NP 04/21/15 at 3:00 pm.

## 2015-04-06 NOTE — Telephone Encounter (Signed)
Returning your call. °

## 2015-04-08 ENCOUNTER — Telehealth: Payer: Self-pay | Admitting: Cardiology

## 2015-04-08 ENCOUNTER — Ambulatory Visit: Payer: Medicare Other | Admitting: Cardiology

## 2015-04-08 NOTE — Telephone Encounter (Signed)
Please call,still having problem with his Atorvastatin.

## 2015-04-08 NOTE — Telephone Encounter (Signed)
Pt states he had taken lipitor last night. Advised/reminded him per instructions, OK to hold for now until further eval on the 25th - may take several days to feel improvement of his symptoms if the lipitor is the cause of problems. Pt verbalized understanding.

## 2015-04-12 ENCOUNTER — Telehealth: Payer: Self-pay | Admitting: Cardiology

## 2015-04-12 NOTE — Telephone Encounter (Signed)
Returned call to patient no answer.LMTC. 

## 2015-04-12 NOTE — Telephone Encounter (Signed)
Returning your call. °

## 2015-04-12 NOTE — Telephone Encounter (Signed)
Pt called in wanting to speak with Elnita Maxwellheryl about Effient. He says that he thinks he has a reaction to the potassium in this medication. Please call  Thanks

## 2015-04-12 NOTE — Telephone Encounter (Signed)
Follow Up       Pt calling to f/u w/ Elnita Maxwellheryl about previously documented medication issue. Please call back and advise.

## 2015-04-13 ENCOUNTER — Emergency Department (HOSPITAL_COMMUNITY)
Admission: EM | Admit: 2015-04-13 | Discharge: 2015-04-14 | Disposition: A | Discharge status: Home / Self Care | Payer: Medicare Other | Attending: Emergency Medicine | Admitting: Emergency Medicine

## 2015-04-13 ENCOUNTER — Emergency Department (HOSPITAL_COMMUNITY): Payer: Medicare Other

## 2015-04-13 ENCOUNTER — Encounter (HOSPITAL_COMMUNITY): Payer: Self-pay

## 2015-04-13 DIAGNOSIS — Z79899 Other long term (current) drug therapy: Secondary | ICD-10-CM | POA: Insufficient documentation

## 2015-04-13 DIAGNOSIS — E785 Hyperlipidemia, unspecified: Secondary | ICD-10-CM | POA: Diagnosis not present

## 2015-04-13 DIAGNOSIS — M1990 Unspecified osteoarthritis, unspecified site: Secondary | ICD-10-CM | POA: Insufficient documentation

## 2015-04-13 DIAGNOSIS — I2510 Atherosclerotic heart disease of native coronary artery without angina pectoris: Secondary | ICD-10-CM | POA: Insufficient documentation

## 2015-04-13 DIAGNOSIS — Z9889 Other specified postprocedural states: Secondary | ICD-10-CM | POA: Insufficient documentation

## 2015-04-13 DIAGNOSIS — Z880 Allergy status to penicillin: Secondary | ICD-10-CM | POA: Diagnosis not present

## 2015-04-13 DIAGNOSIS — I252 Old myocardial infarction: Secondary | ICD-10-CM | POA: Diagnosis not present

## 2015-04-13 DIAGNOSIS — Z720 Tobacco use: Secondary | ICD-10-CM | POA: Insufficient documentation

## 2015-04-13 DIAGNOSIS — Z7902 Long term (current) use of antithrombotics/antiplatelets: Secondary | ICD-10-CM | POA: Diagnosis not present

## 2015-04-13 DIAGNOSIS — H9190 Unspecified hearing loss, unspecified ear: Secondary | ICD-10-CM | POA: Diagnosis not present

## 2015-04-13 DIAGNOSIS — R079 Chest pain, unspecified: Secondary | ICD-10-CM

## 2015-04-13 DIAGNOSIS — Z7982 Long term (current) use of aspirin: Secondary | ICD-10-CM | POA: Diagnosis not present

## 2015-04-13 LAB — CBC
HCT: 44.4 % (ref 39.0–52.0)
Hemoglobin: 15.9 g/dL (ref 13.0–17.0)
MCH: 34.6 pg — AB (ref 26.0–34.0)
MCHC: 35.8 g/dL (ref 30.0–36.0)
MCV: 96.7 fL (ref 78.0–100.0)
PLATELETS: 234 10*3/uL (ref 150–400)
RBC: 4.59 MIL/uL (ref 4.22–5.81)
RDW: 13.8 % (ref 11.5–15.5)
WBC: 7.5 10*3/uL (ref 4.0–10.5)

## 2015-04-13 LAB — I-STAT TROPONIN, ED
TROPONIN I, POC: 0 ng/mL (ref 0.00–0.08)
Troponin i, poc: 0.01 ng/mL (ref 0.00–0.08)

## 2015-04-13 LAB — BASIC METABOLIC PANEL
Anion gap: 13 (ref 5–15)
BUN: 9 mg/dL (ref 6–20)
CO2: 25 mmol/L (ref 22–32)
Calcium: 9.4 mg/dL (ref 8.9–10.3)
Chloride: 98 mmol/L — ABNORMAL LOW (ref 101–111)
Creatinine, Ser: 1.13 mg/dL (ref 0.61–1.24)
GFR calc Af Amer: >60 mL/min (ref >60)
Glucose, Bld: 108 mg/dL — ABNORMAL HIGH (ref 65–99)
Potassium: 4.5 mmol/L (ref 3.5–5.1)
SODIUM: 136 mmol/L (ref 135–145)

## 2015-04-13 NOTE — ED Provider Notes (Signed)
CSN: [id number]     Arrival date & time 04/13/15  1836 History   First MD Initiated [id number] with Patient 04/13/15 2145     Chief Complaint  Patient presents with  . Chest Pain     (Consider location/radiation/quality/duration/timing/severity/associated sxs/prior Treatment) HPI   Casey Jackson is a 64 y.o. male who presents for evaluation of chest discomfort which started spontaneously about 4 hours prior to coming here. The pain resolved spontaneously after he arrived here. There was no associated nausea, vomiting, diaphoresis, weakness or dizziness. He stopped his  atorvastatin, about 4 days ago athe advice of his cardiologist, because it was causing problems with eating. One month ago. He stopped taking his metoprolol (on his own), one month ago because it was causing "chest pain". He has been discussing the possibility of getting a coronary artery bypass, with his cardiologist. He continues to smoke cigarettes but has cut down to 2 each day. He has not drink alcohol currently. He is taking his Effient, and lisinopril, as directed. He denies cough, shortness of breath, weakness or dizziness. There are no other known modifying factors.    Past Medical History  Diagnosis Date  . Osteoarthritis   . Coronary artery disease     a. Big NSTEMI 11/2014: cath with surgical disease but patient initially refused CABG. Compromise decision was made to do intervention on the RCA with a bare metal stent with medical therapy for other disease and follow-up as an outpatient.  . Myocardial infarction     NSTEMI  . HOH (hard of hearing)   . Tobacco abuse   . Ischemic cardiomyopathy     a. a. Cath 12/22/14: EF 25%. b. 2D Echo 12/23/14: EF 25-30%, diffuse hypokinesis, akinesis of the entireinferolateral and inferior myocardium, mild MR.  [name]. Hyperlipidemia   . Transaminitis    Past Surgical History  Procedure Laterality Date  . Left heart catheterization with coronary angiogram N/A 12/22/2014    Procedure:  LEFT HEART CATHETERIZATION WITH CORONARY ANGIOGRAM;  Surgeon: Corky CraftsJayadeep S Varanasi, MD; LAD 100%, CFX 90%, RI mild dz, RCA 95%/70%, EF 25%, CABG recommended  . Percutaneous coronary stent intervention (pci-s) N/A 12/23/2014    Procedure: PERCUTANEOUS CORONARY STENT INTERVENTION (PCI-S);  Surgeon: Corky CraftsJayadeep S Varanasi, MD; 3.0 x 24 rebel BMS to the RCA    Family History  Problem Relation Age of Onset  . Hypertension    . Heart attack Neg Hx   . Stroke Neg Hx   . Cancer Father    History  Substance Use Topics  . Smoking status: Current Every Day Smoker -- 0.50 packs/day for 50 years    Types: Cigarettes  . Smokeless tobacco: Never Used  . Alcohol Use: No    Review of Systems  All other systems reviewed and are negative.     Allergies  Penicillins  Home Medications   Prior to Admission medications   Medication Sig Start Date End Date Taking? Authorizing Provider  aspirin 81 MG chewable tablet Chew 1 tablet (81 mg total) by mouth daily. 12/23/14  Yes Azalee CourseHao Meng, PA  lisinopril (PRINIVIL,ZESTRIL) 2.5 MG tablet Take 1 tablet (2.5 mg total) by mouth daily. 12/24/14  Yes Rhonda G Barrett, PA-C  nitroGLYCERIN (NITROSTAT) 0.4 MG SL tablet Place 1 tablet (0.4 mg total) under the tongue every 5 (five) minutes as needed for chest pain. 12/24/14  Yes Rhonda G Barrett, PA-C  prasugrel (EFFIENT) 10 MG TABS tablet Take 1 tablet (10 mg total) by mouth daily. 12/24/14  Yes Bjorn Loserhonda  G Barrett, PA-C  atorvastatin (LIPITOR) 40 MG tablet Take 1 tablet (40 mg total) by mouth daily. Patient taking differently: Take 20 mg by mouth daily.  12/24/14   Rhonda G Barrett, PA-C  metoprolol succinate (TOPROL-XL) 25 MG 24 hr tablet Take 0.5 tablets (12.5 mg total) by mouth daily. 03/01/15   Beatrice Lecher, PA-C  omeprazole (PRILOSEC) 40 MG capsule Take 1 capsule (40 mg total) by mouth daily. 04/01/15   Peter M [place], MD   BP 110/72 mmHg  Pulse 66  Temp(Src) 98.3 F (36.8 C) (Oral)  Resp 26  Ht  (1.803 m)  Wt 188  lb (85.276 kg)  BMI 26.23 kg/m2  SpO2 97% Physical Exam  Constitutional: He is oriented to person, place, and time. He appears well-developed and well-nourished. No distress.  HENT:  Head: Normocephalic and atraumatic.  Right Ear: External ear normal.  Left Ear: External ear normal.  Eyes: Conjunctivae and EOM are normal. Pupils are equal, round, and reactive to light.  Neck: Normal range of motion and phonation normal. Neck supple.  Cardiovascular: Normal rate, regular rhythm and normal heart sounds.   Pulmonary/Chest: Effort normal and breath sounds normal. No respiratory distress. He has no rales. He exhibits no bony tenderness.  Abdominal: Soft. There is no tenderness.  Musculoskeletal: Normal range of motion.  Neurological: He is alert and oriented to person, place, and time. No cranial nerve deficit or sensory deficit. He exhibits normal muscle tone. Coordination normal.  Skin: Skin is warm, dry and intact.  Psychiatric: He has a normal mood and affect. His behavior is normal. Judgment and thought content normal.  Nursing note and vitals reviewed.   ED Course  Procedures (including critical care time)  Medications - No data to display  Patient Vitals for the past 24 hrs:  BP Temp Temp src Pulse Resp SpO2 Height Weight  04/13/15 2315 110/72 mmHg - - 66 26 97 % - -  04/13/15 2300 (!) 115/54 mmHg - - 71 21 98 % - -  04/13/15 2245 106/70 mmHg - - 68 21 97 % - -  04/13/15 2237 100/65 mmHg - - 66 20 96 % - -  04/13/15 2230 100/65 mmHg - - 66 16 97 % - -  04/13/15 2145 134/82 mmHg - - - 18 - - -  04/13/15 1852 131/62 mmHg 98.3 F (36.8 C) Oral 82 18 100 %  (1.803 m) 188 lb (85.276 kg)    12:02 AM Reevaluation with update and discussion. After initial assessment and treatment, an updated evaluation reveals he remains comfortable. No return of chest pain. Findings discussed with patient, all questions answered.Mancel Bale L    Labs Review Labs Reviewed  CBC -  Abnormal; Notable for the following:    MCH 34.6 (*)    All other components within normal limits  BASIC METABOLIC PANEL - Abnormal; Notable for the following:    Chloride 98 (*)    Glucose, Bld 108 (*)    All other components within normal limits  I-STAT TROPOININ, ED  Rosezena Sensor, ED    Imaging Review Dg Chest 2 View  04/13/2015   CLINICAL DATA:  Acute onset of generalized chest pain. Initial encounter.  EXAM: CHEST  2 VIEW  COMPARISON:  Chest radiograph performed 12/22/2014  FINDINGS: The lungs are well-aerated. A 1.6 cm nodular density is noted at the left lung base. Would recommend repeat chest radiograph with nipple markers. Mild peribronchial thickening is noted. There is no evidence  of pleural effusion or pneumothorax.  The heart is normal in size; the mediastinal contour is within normal limits. No acute osseous abnormalities are seen.  IMPRESSION: 1. 1.6 cm nodular density at the left lung base. Would recommend repeat chest radiograph with nipple markers. 2. Mild peribronchial thickening noted.  Lungs otherwise clear.   Electronically Signed   By: Roanna RaiderJeffery  Chang M.D.   On: 04/13/2015 19:39     EKG Interpretation   Date/Time:  Tuesday Apr 13 2015 18:54:32 EDT Ventricular Rate:  84 PR Interval:  132 QRS Duration: 114 QT Interval:  402 QTC Calculation: 475 R Axis:   -57 Text Interpretation:  Normal sinus rhythm Pulmonary disease pattern Left  anterior fascicular block Septal infarct , age undetermined ST \\T \ T wave  abnormality, consider lateral ischemia Abnormal ECG since last tracing no  significant change Confirmed by Effie ShyWENTZ  MD, Casmer Yepiz [id number] on 04/13/2015  10:48:28 PM      MDM   Final diagnoses:  Nonspecific chest pain    Non-specific chest pain, with negative evaluation for cardiac ischemia, or infarct. Doubt ACS, PE, pneumonia, or metabolic instability.   Nursing Notes Reviewed/ Care Coordinated Applicable Imaging Reviewed Interpretation of Laboratory  Data incorporated into ED treatment  The patient appears reasonably screened and/or stabilized for discharge and I doubt any other medical condition or other [facility] requiring further screening, evaluation, or treatment in the ED at this time prior to discharge.  Plan: Home Medications- usual; Home Treatments- rest; return here if the recommended treatment, does not improve the symptoms; Recommended follow up- PCP prn    Mancel BaleElliott Harland Aguiniga, MD 04/14/15 0004

## 2015-04-13 NOTE — ED Notes (Addendum)
Pt from home for eval of substernal cp that started today at 12 noon, pt states he was eating when cp developed, reports radiation to both shoulders and reports cp has been intermittent since initial episode. Denies any other symptoms, pt states was admitted in January and had stent placed and also was suppose to have bypass surgery if chest pains returned. Pt denies any pain at this time. nad noted.

## 2015-04-13 NOTE — ED Notes (Signed)
Pt reports around noon mid chest pain, lasts 5 min, 7-8 times an hour.

## 2015-04-13 NOTE — Telephone Encounter (Signed)
Returned call to patient he stated he has been having a dull ache in chest since noon today.Stated he is also having pain in middle of back and across shoulder blades.Stated he is [facility].Advised to call 911.

## 2015-04-14 NOTE — Discharge Instructions (Signed)

## 2015-04-15 ENCOUNTER — Telehealth: Payer: Self-pay

## 2015-04-15 ENCOUNTER — Ambulatory Visit: Payer: Medicare Other | Admitting: Cardiology

## 2015-04-15 NOTE — Telephone Encounter (Signed)
Spoke to patient he stated he went to ER 04/13/15.Stated lab work and ekg ok.Stated he still wanted to talk to Dr.Jordan first before he goes back to see heart surgeon.Advised to keep appointment with Nada BoozerLaura Ingold NP 04/21/15 at 3:00 pm and Dr.Jordan 05/03/15 9:15 am.

## 2015-04-17 ENCOUNTER — Emergency Department (HOSPITAL_COMMUNITY)
Admission: EM | Admit: 2015-04-17 | Discharge: 2015-04-17 | Disposition: A | Discharge status: Home / Self Care | Payer: Medicare Other | Attending: Emergency Medicine | Admitting: Emergency Medicine

## 2015-04-17 ENCOUNTER — Encounter (HOSPITAL_COMMUNITY): Payer: Self-pay | Admitting: *Deleted

## 2015-04-17 DIAGNOSIS — Z720 Tobacco use: Secondary | ICD-10-CM | POA: Insufficient documentation

## 2015-04-17 DIAGNOSIS — Z9889 Other specified postprocedural states: Secondary | ICD-10-CM | POA: Insufficient documentation

## 2015-04-17 DIAGNOSIS — Z7902 Long term (current) use of antithrombotics/antiplatelets: Secondary | ICD-10-CM | POA: Diagnosis not present

## 2015-04-17 DIAGNOSIS — I252 Old myocardial infarction: Secondary | ICD-10-CM | POA: Insufficient documentation

## 2015-04-17 DIAGNOSIS — H9190 Unspecified hearing loss, unspecified ear: Secondary | ICD-10-CM | POA: Diagnosis not present

## 2015-04-17 DIAGNOSIS — R002 Palpitations: Secondary | ICD-10-CM | POA: Diagnosis not present

## 2015-04-17 DIAGNOSIS — Z8739 Personal history of other diseases of the musculoskeletal system and connective tissue: Secondary | ICD-10-CM | POA: Insufficient documentation

## 2015-04-17 DIAGNOSIS — T45525A Adverse effect of antithrombotic drugs, initial encounter: Secondary | ICD-10-CM | POA: Diagnosis not present

## 2015-04-17 DIAGNOSIS — Z880 Allergy status to penicillin: Secondary | ICD-10-CM | POA: Diagnosis not present

## 2015-04-17 DIAGNOSIS — Z79899 Other long term (current) drug therapy: Secondary | ICD-10-CM | POA: Insufficient documentation

## 2015-04-17 DIAGNOSIS — I2510 Atherosclerotic heart disease of native coronary artery without angina pectoris: Secondary | ICD-10-CM | POA: Diagnosis not present

## 2015-04-17 DIAGNOSIS — Z8639 Personal history of other endocrine, nutritional and metabolic disease: Secondary | ICD-10-CM | POA: Diagnosis not present

## 2015-04-17 LAB — I-STAT TROPONIN, ED: Troponin i, poc: 0.02 ng/mL (ref 0.00–0.08)

## 2015-04-17 LAB — I-STAT CHEM 8, ED
BUN: 9 mg/dL (ref 6–20)
CALCIUM ION: 1.11 mmol/L — AB (ref 1.13–1.30)
CHLORIDE: 99 mmol/L — AB (ref 101–111)
Creatinine, Ser: 0.9 mg/dL (ref 0.61–1.24)
Glucose, Bld: 89 mg/dL (ref 65–99)
HCT: 46 % (ref 39.0–52.0)
Hemoglobin: 15.6 g/dL (ref 13.0–17.0)
Potassium: 3.9 mmol/L (ref 3.5–5.1)
Sodium: 136 mmol/L (ref 135–145)
TCO2: 22 mmol/L (ref 0–100)

## 2015-04-17 NOTE — ED Notes (Signed)
Pt reports having palpitations for extended amount of time, more severe after eating foods high in potassium. Pt thinks its a reaction to his bp medication. HR 87 at triage.

## 2015-04-17 NOTE — ED Provider Notes (Signed)
CSN: [id number]     Arrival date & time 04/17/15  1318 History   First MD Initiated Contact with Patient 04/17/15 1331     Chief Complaint  Patient presents with  . Palpitations     HPI Patient presents with palpitations after he takes his blood pressure medicine.  He always takes his medicine in the morning and any develops heart palpitations.  Denies any chest pain.  No history of atrial fibrillation.  He does take blood thinners because he has a stent. Past Medical History  Diagnosis Date  . Osteoarthritis   . Coronary artery disease     a. Big NSTEMI 11/2014: cath with surgical disease but patient initially refused CABG. Compromise decision was made to do intervention on the RCA with a bare metal stent with medical therapy for other disease and follow-up as an outpatient.  . Myocardial infarction     NSTEMI  . HOH (hard of hearing)   . Tobacco abuse   . Ischemic cardiomyopathy     a. a. Cath 12/22/14: EF 25%. b. 2D Echo 12/23/14: EF 25-30%, diffuse hypokinesis, akinesis of the entireinferolateral and inferior myocardium, mild MR.  [name]. Hyperlipidemia   . Transaminitis    Past Surgical History  Procedure Laterality Date  . Left heart catheterization with coronary angiogram N/A 12/22/2014    Procedure: LEFT HEART CATHETERIZATION WITH CORONARY ANGIOGRAM;  Surgeon: Corky CraftsJayadeep S Varanasi, MD; LAD 100%, CFX 90%, RI mild dz, RCA 95%/70%, EF 25%, CABG recommended  . Percutaneous coronary stent intervention (pci-s) N/A 12/23/2014    Procedure: PERCUTANEOUS CORONARY STENT INTERVENTION (PCI-S);  Surgeon: Corky CraftsJayadeep S Varanasi, MD; 3.0 x 24 rebel BMS to the RCA    Family History  Problem Relation Age of Onset  . Hypertension    . Heart attack Neg Hx   . Stroke Neg Hx   . Cancer Father    History  Substance Use Topics  . Smoking status: Current Every Day Smoker -- 0.50 packs/day for 50 years    Types: Cigarettes  . Smokeless tobacco: Never Used  . Alcohol Use: No    Review of Systems All  other systems reviewed and are negative   Allergies  Penicillins  Home Medications   Prior to Admission medications   Medication Sig Start Date End Date Taking? Authorizing Provider  lisinopril (PRINIVIL,ZESTRIL) 2.5 MG tablet Take 1 tablet (2.5 mg total) by mouth daily. 12/24/14  Yes Rhonda G Barrett, PA-C  nitroGLYCERIN (NITROSTAT) 0.4 MG SL tablet Place 1 tablet (0.4 mg total) under the tongue every 5 (five) minutes as needed for chest pain. 12/24/14  Yes Rhonda G Barrett, PA-C  prasugrel (EFFIENT) 10 MG TABS tablet Take 1 tablet (10 mg total) by mouth daily. 12/24/14  Yes Rhonda G Barrett, PA-C  omeprazole (PRILOSEC) 40 MG capsule Take 1 capsule (40 mg total) by mouth daily. Patient not taking: Reported on 04/17/2015 04/01/15   Peter M Swazil[place]Jordan, MD   BP 102/60 mmHg  Pulse 68  Temp(Src) 97.6 F (36.4 C) (Oral)  Resp 24  SpO2 99% Physical Exam Physical Exam  Nursing note and vitals reviewed. Constitutional: He is oriented to person, place, and time. He appears well-developed and well-nourished. No distress.  HENT:  Head: Normocephalic and atraumatic.  Eyes: Pupils are equal, round, and reactive to light.  Neck: Normal range of motion.  Cardiovascular: Normal rate and intact distal pulses.   Pulmonary/Chest: No respiratory distress.  Abdominal: Normal appearance. He exhibits no distension.  Musculoskeletal: Normal range of motion.  Neurological: He is alert and oriented to person, place, and time. No cranial nerve deficit.  Skin: Skin is warm and dry. No rash noted.  Psychiatric: He has a normal mood and affect. His behavior is normal.   ED Course  Procedures (including critical care time) Labs Review Labs Reviewed  I-STAT CHEM 8, ED - Abnormal; Notable for the following:    Chloride 99 (*)    Calcium, Ion 1.11 (*)    All other components within normal limits  I-STAT TROPOININ, ED    Imaging Review No results found.   EKG Interpretation   Date/Time:  Saturday Apr 17 2015 13:24:26 EDT Ventricular Rate:  91 PR Interval:  124 QRS Duration: 110 QT Interval:  402 QTC Calculation: 494 R Axis:   -59 Text Interpretation:  Normal sinus rhythm Left axis deviation Pulmonary  disease pattern Septal infarct , age undetermined Abnormal ECG No  significant change since last tracing Confirmed by Kileigh Ortmann  MD, Maximilliano Kersh  ([id number]) on 04/17/2015 1:29:19 PM     EKG is unremarkable.  Troponin negative.  Labs are normal.  Plan at this time is to switch his medication to right before bedtime which hopefully will resolve his feeling of palpitations.  If this does not solve the problem he needs a Holter monitor which he can get as cardiologist's office. MDM   Final diagnoses:  Palpitations  Adverse reaction to antithrombotic medication, initial encounter        Nelva Nay, MD 04/17/15 1435

## 2015-04-17 NOTE — Discharge Instructions (Signed)
Take your blood pressure medicine right before you go to bed rather than in the morning.

## 2015-04-17 NOTE — ED Notes (Signed)
Patient transported to X-ray 

## 2015-04-21 ENCOUNTER — Ambulatory Visit (INDEPENDENT_AMBULATORY_CARE_PROVIDER_SITE_OTHER): Payer: Medicare Other

## 2015-04-21 ENCOUNTER — Ambulatory Visit (INDEPENDENT_AMBULATORY_CARE_PROVIDER_SITE_OTHER): Payer: Medicare Other | Admitting: Cardiology

## 2015-04-21 ENCOUNTER — Encounter: Payer: Self-pay | Admitting: Cardiology

## 2015-04-21 VITALS — BP 122/66 | HR 84 | Ht 71.0 in | Wt 187.5 lb

## 2015-04-21 DIAGNOSIS — I2510 Atherosclerotic heart disease of native coronary artery without angina pectoris: Secondary | ICD-10-CM | POA: Diagnosis not present

## 2015-04-21 DIAGNOSIS — R000 Tachycardia, unspecified: Secondary | ICD-10-CM

## 2015-04-21 DIAGNOSIS — Z9861 Coronary angioplasty status: Secondary | ICD-10-CM

## 2015-04-21 DIAGNOSIS — I255 Ischemic cardiomyopathy: Secondary | ICD-10-CM | POA: Diagnosis not present

## 2015-04-21 DIAGNOSIS — E785 Hyperlipidemia, unspecified: Secondary | ICD-10-CM

## 2015-04-21 DIAGNOSIS — Z720 Tobacco use: Secondary | ICD-10-CM

## 2015-04-21 NOTE — Progress Notes (Signed)
Cardiology Office Note   Date:  04/21/2015   ID:  Casey Jackson, DOB 1951-03-01, MRN [id number]  PCP:  Dorrene GermanAVBUERE,EDWIN A, MD  Cardiologist:  Dr. SwazilandJordan   Chief Complaint  Patient presents with  . Hospitalization Follow-up    pt stated when taking Metoprolol it makes his chest hurt, and lisinopril make his heart race      History of Present Illness: Casey Jackson is a 64 y.o. male who presents for eval of his CAD.   He has recent hx of CAD, ischemic cardiomyopathy, hyperlipidemia, tobacco abuse. He was admitted in 11/2014 with a non-STEMI. Cardiac catheterization demonstrated 3 vessel CAD. CABG was recommended. However, the patient refused. He was taken back to the Cath Lab and underwent PCI of the RCA with a bare metal stent. He was to follow-up with cardiothoracic surgery for ultimate CABG. Echocardiogram demonstrated reduced EF at 25-30%. LifeVest was recommended. The patient declined. He was seen in the office by Ronie Spiesayna Dunn, PA-C 01/26/15. He was maintained on aspirin and Effient. He remained on ACE inhibitor. Beta blocker was added to his medical regimen. He did not tolerate the coreg and on next visit it was changed to toprol XL he states this causes chest pain so it was stopped.    Saw Dr. Laneta SimmersBartle and pt has decided to have CABG once he talks with Dr SwazilandJordan and has his questions answered.  He has been to ER twice with chest pain X 1 and palpitations this month. His troponin was negative.  The palpitations are described as rapid HR that causes him to be "drunk" when clarified he is dizzy so dizzy he may fall down.  He relates the fast HR to eating foods with K+ while he is on Lisinopril. (in pt information sheet there is note to be careful with K+ rich foods.   We discussed that it may be the heart disease causing the fast HR and being off BB, but he is adamant that it is when he eats foods with potassium.  In ER on EKG his HR was 91 ST. But he tells me he waited several hours to go to ER  waiting for a ride.  Pt is very frustrated with not being able to eat certain foods.  With low EF concerned he may have more dangerous arrhthymias.  No further chest pain.  He has cut tobacco to 1 cigarette a day.  He was congratulated.   Past Medical History  Diagnosis Date  . Osteoarthritis   . Coronary artery disease     a. Big NSTEMI 11/2014: cath with surgical disease but patient initially refused CABG. Compromise decision was made to do intervention on the RCA with a bare metal stent with medical therapy for other disease and follow-up as an outpatient.  . Myocardial infarction     NSTEMI  . HOH (hard of hearing)   . Tobacco abuse   . Ischemic cardiomyopathy     a. a. Cath 12/22/14: EF 25%. b. 2D Echo 12/23/14: EF 25-30%, diffuse hypokinesis, akinesis of the entireinferolateral and inferior myocardium, mild MR.  [name]. Hyperlipidemia   . Transaminitis     Past Surgical History  Procedure Laterality Date  . Left heart catheterization with coronary angiogram N/A 12/22/2014    Procedure: LEFT HEART CATHETERIZATION WITH CORONARY ANGIOGRAM;  Surgeon: Corky CraftsJayadeep S Varanasi, MD; LAD 100%, CFX 90%, RI mild dz, RCA 95%/70%, EF 25%, CABG recommended  . Percutaneous coronary stent intervention (pci-s) N/A 12/23/2014    Procedure: PERCUTANEOUS  CORONARY STENT INTERVENTION (PCI-S);  Surgeon: Corky Crafts, MD; 3.0 x 24 rebel BMS to the RCA      Current Outpatient Prescriptions  Medication Sig Dispense Refill  . atorvastatin (LIPITOR) 40 MG tablet Take 1 tablet by mouth daily.  11  . lisinopril (PRINIVIL,ZESTRIL) 2.5 MG tablet Take 1 tablet (2.5 mg total) by mouth daily. 30 tablet 11  . nitroGLYCERIN (NITROSTAT) 0.4 MG SL tablet Place 1 tablet (0.4 mg total) under the tongue every 5 (five) minutes as needed for chest pain. 25 tablet 3  . prasugrel (EFFIENT) 10 MG TABS tablet Take 1 tablet (10 mg total) by mouth daily. 30 tablet 11   No current facility-administered medications for this visit.     Allergies:   Penicillins    Social History:  The patient  reports that he has been smoking Cigarettes.  He has a 25 pack-year smoking history. He has never used smokeless tobacco. He reports that he does not drink alcohol or use illicit drugs.   Family History:  The patient's family history includes Cancer in his father; Hypertension in an other family member. There is no history of Heart attack or Stroke.    ROS:  General:no colds or fevers, + weight loss 18 lbs Skin:no rashes or ulcers HEENT:no blurred vision, no congestion CV:see HPI PUL:see HPI GI:no diarrhea constipation or melena, no indigestion GU:no hematuria, no dysuria MS:no joint pain, no claudication Neuro:no syncope, + lightheadedness with foods with K+ Endo:no diabetes, no thyroid disease  Wt Readings from Last 3 Encounters:  04/21/15 187 lb 8 oz (85.049 kg)  04/13/15 188 lb (85.276 kg)  03/03/15 205 lb (92.987 kg)     PHYSICAL EXAM: VS:  BP 122/66 mmHg  Pulse 84  Ht  (1.803 m)  Wt 187 lb 8 oz (85.049 kg)  BMI 26.16 kg/m2 , BMI Body mass index is 26.16 kg/(m^2). General:Pleasant affect, NAD Skin:Warm and dry, brisk capillary refill HEENT:normocephalic, sclera clear, mucus membranes moist Neck:supple, no JVD, no bruits  Heart:S1S2 RRR without murmur, gallup, rub or click Lungs:clear without rales, rhonchi, or wheezes JYN:WGNF, non tender, + BS, do not palpate liver spleen or masses Ext:no lower ext edema, 2+ pedal pulses, 2+ radial pulses Neuro:alert and oriented, MAE, follows commands, + facial symmetry    EKG:  EKG is NOT ordered today. The ekg from ER without acute changes   Recent Labs: 12/22/2014: B Natriuretic Peptide 91.6; TSH 2.111 01/26/2015: ALT 16 04/13/2015: Platelets 234 04/17/2015: BUN 9; Creatinine 0.90; Hemoglobin 15.6; Potassium 3.9; Sodium 136    Lipid Panel    Component Value Date/Time   CHOL 122 01/26/2015 1243   TRIG 132.0 01/26/2015 1243   HDL 31.50* 01/26/2015 1243    CHOLHDL 4 01/26/2015 1243   VLDL 26.4 01/26/2015 1243   LDLCALC 64 01/26/2015 1243       Other studies Reviewed: Additional studies/ records that were reviewed today include:ER notes, OV notes of cards and TCTS.[name]   ASSESSMENT AND PLAN: Coronary artery disease involving native coronary artery of native heart without angina pectoris He remains stable without significant symptoms of angina. He does have significant residual CAD that was felt to benefit from CABG. He has seen Dr. Laneta Simmers and pt believes he will have CABG once he discusses with Dr. [place].  He remains on aspirin and Effient. For now, I would recommend he remain on these. Also, continue ACE inhibitor- though he wan not happy with the ACE- ARBs have similar statements in their pt  information.  Continue statin. I did add bystolic 2.5 mg daily samples were given.  Not sure pt will take. I discussed the pt with Dr. Erlene Quan and he felt pt needed BB.  Tachycardia, symptomatic-  concern for ventricular arrhthymias with decreased EF.  he has refused lifevest.  After discussing with Dr. Erlene Quan I added event monitor for 2 weeks.      Cardiomyopathy, ischemic He had some issues with carvedilol and toprol, both have been stopped. Continue lisinopril 2.5 mg daily. Begin bystolic 2.5 mg daily.  Hyperlipidemia LDL goal <70 Continue statin. Recent LDL optimal.  Tobacco abuse  He continues to try to quit. Sown to 1 cigarette a day.      Current medicines are reviewed with the patient today.  The patient Has no concerns regarding medicines.  The following changes have been made:  See above Labs/ tests ordered today include:see above  Disposition:   FU:  see above  Nyoka Lint, NP  04/21/2015 3:18 PM    [facility] [address] [place], [place], [place]  [address]/ [address] [place], [place] Phone: [phone]; Fax: [phone]  [phone]

## 2015-04-21 NOTE — Patient Instructions (Addendum)
Your physician recommends that you schedule a follow-up appointment in: Keep appointment with Dr SwazilandJordan June 6th @ 9:15 am   Your physician has recommended that you wear an event monitor. Event monitors are medical devices that record the heart's electrical activity. Doctors most often us these monitors to diagnose arrhythmias. Arrhythmias are problems with the speed or rhythm of the heartbeat. The monitor is a small, portable device. You can wear one while you do your normal daily activities. This is usually used to diagnose what is causing palpitations/syncope (passing out).  Your physician has recommended you make the following change in your medication: Start Bystolic 2.5 mg daily

## 2015-04-27 ENCOUNTER — Telehealth: Payer: Self-pay

## 2015-04-27 ENCOUNTER — Encounter: Payer: Self-pay | Admitting: Cardiology

## 2015-04-27 ENCOUNTER — Ambulatory Visit (INDEPENDENT_AMBULATORY_CARE_PROVIDER_SITE_OTHER): Payer: Medicare Other | Admitting: Cardiology

## 2015-04-27 VITALS — BP 92/60 | HR 72 | Ht 71.0 in | Wt 185.6 lb

## 2015-04-27 DIAGNOSIS — I255 Ischemic cardiomyopathy: Secondary | ICD-10-CM

## 2015-04-27 DIAGNOSIS — I5022 Chronic systolic (congestive) heart failure: Secondary | ICD-10-CM

## 2015-04-27 DIAGNOSIS — I25119 Atherosclerotic heart disease of native coronary artery with unspecified angina pectoris: Secondary | ICD-10-CM | POA: Diagnosis not present

## 2015-04-27 DIAGNOSIS — E785 Hyperlipidemia, unspecified: Secondary | ICD-10-CM

## 2015-04-27 NOTE — Patient Instructions (Addendum)
Stop bystolic and lisinopril  Continue your other therapy -  We will arrange a follow up visit with Dr. Laneta SimmersBartle.

## 2015-04-27 NOTE — Telephone Encounter (Signed)
Received a call from patient stating he has been up all night with fast heart beat,dizzy.Stated he ate something yesterday with potassium and he cannot eat any food with potassium.Stated fast heart beat better at present.No chest pain. Stated he took Bystolic 5 mg.Stated something has got to be done.Advised I will work him in this afternoon to see Dr.Jordan.Stated he has to call [employer] and they require 1 week notice.He will call and see if he can get a ride and will call me back.

## 2015-04-27 NOTE — Telephone Encounter (Signed)
Received a call back from patient he stated he will see Dr.Jordan this afternoon at 4:30 pm he has transportation.

## 2015-04-27 NOTE — Progress Notes (Signed)
Cardiology Office Note   Date:  04/27/2015   ID:  Casey Jackson Langenbach, DOB [birth year], MRN [id number]  PCP:  Dorrene GermanAVBUERE,EDWIN A, MD  Cardiologist:  Dr. [place]Jordan   Chief Complaint  Patient presents with  . Follow-up    per pt had some chest pain, no shortness of breath, no leg pain, no cramps in legs, has some dizziness, has some lightheadednss  . Hypertension    pt wants to discuss med side effects       History of Present Illness: Casey Jackson Yearsley is a 64 y.o. male who presents for eval of his CAD.   He was admitted in January with NSTEMI, ischemic cardiomyopathy, hyperlipidemia, tobacco abuse. Cardiac catheterization demonstrated 3 vessel CAD. CABG was recommended. However, the patient refused due to social issues. He was taken back to the Cath Lab and underwent PCI of the RCA with a bare metal stent. This was in order for him to get his social issues in order.   Echocardiogram demonstrated reduced EF at 25-30%. LifeVest was recommended. The patient declined.  He was maintained on aspirin and Effient. He remained on ACE inhibitor. He has been tried on several beta blockers including Bystolic, Coreg, and Toprol but has been unable to tolerate with symptoms of chest pain, dizziness, and feeling drunk. He does have a nonproductive cough. He thought atorvastatin was causing some of these symptoms but stopping it did not help and he went back to taking it. He was seen in the ED 2 weeks ago with chest pain and sent home.    He has cut tobacco to 1 cigarette a day.  He was congratulated.   He tells me today that he wants to proceed with CABG because he is having too many problems. d  Past Medical History  Diagnosis Date  . Osteoarthritis   . Coronary artery disease     a. Big NSTEMI 11/2014: cath with surgical disease but patient initially refused CABG. Compromise decision was made to do intervention on the RCA with a bare metal stent with medical therapy for other disease and follow-up as an  outpatient.  . Myocardial infarction     NSTEMI  . HOH (hard of hearing)   . Tobacco abuse   . Ischemic cardiomyopathy     a. a. Cath 12/22/14: EF 25%. b. 2D Echo 12/23/14: EF 25-30%, diffuse hypokinesis, akinesis of the entireinferolateral and inferior myocardium, mild MR.  [name]. Hyperlipidemia   . Transaminitis     Past Surgical History  Procedure Laterality Date  . Left heart catheterization with coronary angiogram N/A 12/22/2014    Procedure: LEFT HEART CATHETERIZATION WITH CORONARY ANGIOGRAM;  Surgeon: Corky CraftsJayadeep S Varanasi, MD; LAD 100%, CFX 90%, RI mild dz, RCA 95%/70%, EF 25%, CABG recommended  . Percutaneous coronary stent intervention (pci-s) N/A 12/23/2014    Procedure: PERCUTANEOUS CORONARY STENT INTERVENTION (PCI-S);  Surgeon: Corky CraftsJayadeep S Varanasi, MD; 3.0 x 24 rebel BMS to the RCA      Current Outpatient Prescriptions  Medication Sig Dispense Refill  . aspirin 81 MG tablet Take 81 mg by mouth daily.    [name]. atorvastatin (LIPITOR) 40 MG tablet Take 1 tablet by mouth daily.  11  . lisinopril (PRINIVIL,ZESTRIL) 2.5 MG tablet Take 1 tablet (2.5 mg total) by mouth daily. 30 tablet 11  . nitroGLYCERIN (NITROSTAT) 0.4 MG SL tablet Place 1 tablet (0.4 mg total) under the tongue every 5 (five) minutes as needed for chest pain. 25 tablet 3  . prasugrel (EFFIENT) 10 MG TABS  tablet Take 1 tablet (10 mg total) by mouth daily. 30 tablet 11   No current facility-administered medications for this visit.    Allergies:   Penicillins    Social History:  The patient  reports that he has been smoking Cigarettes.  He has a 25 pack-year smoking history. He has never used smokeless tobacco. He reports that he does not drink alcohol or use illicit drugs.   Family History:  The patient's family history includes Cancer in his father; Hypertension in an other family member. There is no history of Heart attack or Stroke.    ROS:  General:no colds or fevers, + weight loss 18 lbs Skin:no rashes or  ulcers HEENT:no blurred vision, no congestion CV:see HPI PUL:see HPI GI:no diarrhea constipation or melena, no indigestion GU:no hematuria, no dysuria MS:no joint pain, no claudication Neuro:no syncope, + lightheadedness  Endo:no diabetes, no thyroid disease  Wt Readings from Last 3 Encounters:  04/27/15 84.188 kg (185 lb 9.6 oz)  04/21/15 85.049 kg (187 lb 8 oz)  04/13/15 85.276 kg (188 lb)     PHYSICAL EXAM: VS:  BP 92/60 mmHg  Pulse 72  Ht  (1.803 m)  Wt 84.188 kg (185 lb 9.6 oz)  BMI 25.90 kg/m2 , BMI Body mass index is 25.9 kg/(m^2). General:Pleasant affect, NAD Skin:Warm and dry, brisk capillary refill HEENT:normocephalic, sclera clear, mucus membranes moist Neck:supple, no JVD, no bruits  Heart:S1S2 RRR without murmur, gallup, rub or click Lungs:clear without rales, rhonchi, or wheezes ZOX:WRUE, non tender, + BS, do not palpate liver spleen or masses Ext:no lower ext edema, 2+ pedal pulses, 2+ radial pulses Neuro:alert and oriented, MAE, follows commands, + facial symmetry    EKG:  EKG is NOT ordered today. The ekg from ER without acute changes   Recent Labs: 12/22/2014: B Natriuretic Peptide 91.6; TSH 2.111 01/26/2015: ALT 16 04/13/2015: Platelets 234 04/17/2015: BUN 9; Creatinine 0.90; Hemoglobin 15.6; Potassium 3.9; Sodium 136    Lipid Panel    Component Value Date/Time   CHOL 122 01/26/2015 1243   TRIG 132.0 01/26/2015 1243   HDL 31.50* 01/26/2015 1243   CHOLHDL 4 01/26/2015 1243   VLDL 26.4 01/26/2015 1243   LDLCALC 64 01/26/2015 1243       Other studies Reviewed: Additional studies/ records that were reviewed today include:ER notes, OV notes of cards and TCTS.[name]   ASSESSMENT AND PLAN: Coronary artery disease involving native coronary artery of native heart without angina pectoris I reviewed his cath findings from January. He has CTO of the LAD with collaterals. There is severe disease in the LCX which supplies 2 marginal branches. A  critical lesion in the mid RCA was stented successfully but there was a severe ulcerated stenosis in the distal RCA prior to the bifurcation.   He remains on aspirin and Effient. For now we will hold ACE and beta blocker therapy since he is hypotensive and this is probably contributing to his side effects.   Tachycardia, symptomatic-  concern for ventricular arrhthymias with decreased EF.  So far Cardionet event monitor without arrhythmia.   Cardiomyopathy, ischemic He had some issues with carvedilol and toprol, both have been stopped. discontinue lisinopril 2.5 mg daily.  I think his LV EF is not likely to improve without revascularization. He has critical 3 vessel disease and is at high risk going forward.   Hyperlipidemia LDL goal <70 Continue statin. Recent LDL optimal.  Tobacco abuse  He continues to try to quit. Sown to 1 cigarette a  day.   I would encourage patient to pursue CABG. He states he is ready and would like to have this done in early July. We will schedule him a follow up appt. With Dr. Laneta Simmers.   Signed, Charels Stambaugh [place], MD  04/27/2015 5:12 PM    [facility] [address] [place], [place], [place]  [address]/ [address] [place], [place] Phone: [phone]; Fax: [phone]  [phone]

## 2015-04-29 ENCOUNTER — Encounter (HOSPITAL_COMMUNITY): Payer: Self-pay | Admitting: *Deleted

## 2015-04-29 ENCOUNTER — Emergency Department (HOSPITAL_COMMUNITY)
Admission: EM | Admit: 2015-04-29 | Discharge: 2015-04-30 | Discharge status: Against Medical Advice | Payer: Medicare Other | Source: Home / Self Care | Attending: Emergency Medicine | Admitting: Emergency Medicine

## 2015-04-29 ENCOUNTER — Emergency Department (HOSPITAL_COMMUNITY): Payer: Medicare Other

## 2015-04-29 ENCOUNTER — Other Ambulatory Visit: Payer: Self-pay

## 2015-04-29 DIAGNOSIS — I2510 Atherosclerotic heart disease of native coronary artery without angina pectoris: Secondary | ICD-10-CM

## 2015-04-29 DIAGNOSIS — E785 Hyperlipidemia, unspecified: Secondary | ICD-10-CM | POA: Diagnosis present

## 2015-04-29 DIAGNOSIS — M1990 Unspecified osteoarthritis, unspecified site: Secondary | ICD-10-CM | POA: Diagnosis present

## 2015-04-29 DIAGNOSIS — I252 Old myocardial infarction: Secondary | ICD-10-CM

## 2015-04-29 DIAGNOSIS — I255 Ischemic cardiomyopathy: Secondary | ICD-10-CM | POA: Diagnosis present

## 2015-04-29 DIAGNOSIS — I249 Acute ischemic heart disease, unspecified: Principal | ICD-10-CM | POA: Diagnosis present

## 2015-04-29 DIAGNOSIS — Z79899 Other long term (current) drug therapy: Secondary | ICD-10-CM

## 2015-04-29 DIAGNOSIS — Z7982 Long term (current) use of aspirin: Secondary | ICD-10-CM

## 2015-04-29 DIAGNOSIS — H9190 Unspecified hearing loss, unspecified ear: Secondary | ICD-10-CM | POA: Diagnosis present

## 2015-04-29 DIAGNOSIS — R079 Chest pain, unspecified: Secondary | ICD-10-CM

## 2015-04-29 DIAGNOSIS — I5022 Chronic systolic (congestive) heart failure: Secondary | ICD-10-CM

## 2015-04-29 DIAGNOSIS — Z720 Tobacco use: Secondary | ICD-10-CM | POA: Insufficient documentation

## 2015-04-29 DIAGNOSIS — F17210 Nicotine dependence, cigarettes, uncomplicated: Secondary | ICD-10-CM | POA: Diagnosis present

## 2015-04-29 DIAGNOSIS — Z955 Presence of coronary angioplasty implant and graft: Secondary | ICD-10-CM

## 2015-04-29 DIAGNOSIS — Z880 Allergy status to penicillin: Secondary | ICD-10-CM | POA: Insufficient documentation

## 2015-04-29 MED ORDER — ASPIRIN 81 MG PO CHEW
243.0000 mg | CHEWABLE_TABLET | Freq: Once | ORAL | Status: AC
  Administered 2015-04-29: 243 mg via ORAL
  Filled 2015-04-29: qty 3

## 2015-04-29 NOTE — ED Notes (Addendum)
Pt states that he woke up an hr ago with left sided chest pain and weakness in the left arm. States that his left arm also feels somewhat numb. Does not recall a hx of the same with arm weakness.  Dr Gwendolyn GrantWalden to Triage to evaluate pt.

## 2015-04-29 NOTE — ED Provider Notes (Signed)
CSN: [id number]     Arrival date & time 04/29/15  2247 History   First MD Initiated Contact with Patient 04/29/15 2301     This chart was scribed for Casey Severinlga Carles Florea, MD by Arlan OrganAshley Leger, ED Scribe. This patient was seen in room B14C/B14C and the patient's care was started 11:15 PM.   No chief complaint on file.  The history is provided by the patient. No language interpreter was used.    HPI Comments: Casey Jackson is a 64 y.o. male with a PMHx of CAD, MI, ischemic cardiomyopathy, and hyperlipidemia who presents to the Emergency Department complaining of constant, ongoing, improving L sided chest pain that radiates into the L shoulder onset 10 PM this evening that woke him from sleep. Currently chest pain is rated 2/10. Pt reports similar episode of discomfort earlier this afternoon that resolved after a short period of time without treatment.  Pt also reports generalized weakness and mild numbness to the the L arm. One dose of baby ASA taken prior to arrival. No Nitro attempted after onset of symptoms. No fever, chills, leg swelling, cough. Pt was recently evaluated by Cardiology on 5/31. At time of visit, pt was taken off of Lisinopril and Bystolic. PSHx includes percutaneous coronary stent intervention- January 2016. He is also due to have CABG in the near future as he previously refused surgery. Pt with known allergy to Penicillins.  Past Medical History  Diagnosis Date  . Osteoarthritis   . Coronary artery disease     a. Big NSTEMI 11/2014: cath with surgical disease but patient initially refused CABG. Compromise decision was made to do intervention on the RCA with a bare metal stent with medical therapy for other disease and follow-up as an outpatient.  . Myocardial infarction     NSTEMI  . HOH (hard of hearing)   . Tobacco abuse   . Ischemic cardiomyopathy     a. a. Cath 12/22/14: EF 25%. b. 2D Echo 12/23/14: EF 25-30%, diffuse hypokinesis, akinesis of the entireinferolateral and inferior  myocardium, mild MR.  [name]. Hyperlipidemia   . Transaminitis    Past Surgical History  Procedure Laterality Date  . Left heart catheterization with coronary angiogram N/A 12/22/2014    Procedure: LEFT HEART CATHETERIZATION WITH CORONARY ANGIOGRAM;  Surgeon: Corky CraftsJayadeep S Varanasi, MD; LAD 100%, CFX 90%, RI mild dz, RCA 95%/70%, EF 25%, CABG recommended  . Percutaneous coronary stent intervention (pci-s) N/A 12/23/2014    Procedure: PERCUTANEOUS CORONARY STENT INTERVENTION (PCI-S);  Surgeon: Corky CraftsJayadeep S Varanasi, MD; 3.0 x 24 rebel BMS to the RCA    Family History  Problem Relation Age of Onset  . Hypertension    . Heart attack Neg Hx   . Stroke Neg Hx   . Cancer Father    History  Substance Use Topics  . Smoking status: Current Every Day Smoker -- 0.50 packs/day for 50 years    Types: Cigarettes  . Smokeless tobacco: Never Used  . Alcohol Use: No    Review of Systems  Constitutional: Negative for fever and chills.  Respiratory: Negative for cough.   Cardiovascular: Positive for chest pain. Negative for leg swelling.  Gastrointestinal: Negative for nausea and vomiting.  Musculoskeletal: Positive for arthralgias.  Skin: Negative for rash.  Neurological: Positive for weakness.  Psychiatric/Behavioral: Negative for confusion.      Allergies  Penicillins  Home Medications   Prior to Admission medications   Medication Sig Start Date End Date Taking? Authorizing Provider  aspirin 81 MG tablet  Take 81 mg by mouth daily.    Historical Provider, MD  atorvastatin (LIPITOR) 40 MG tablet Take 1 tablet by mouth daily. 03/21/15   Historical Provider, MD  lisinopril (PRINIVIL,ZESTRIL) 2.5 MG tablet Take 1 tablet (2.5 mg total) by mouth daily. 12/24/14   Rhonda G Barrett, PA-C  nitroGLYCERIN (NITROSTAT) 0.4 MG SL tablet Place 1 tablet (0.4 mg total) under the tongue every 5 (five) minutes as needed for chest pain. 12/24/14   Rhonda G Barrett, PA-C  prasugrel (EFFIENT) 10 MG TABS tablet Take 1  tablet (10 mg total) by mouth daily. 12/24/14   Joline Salt Barrett, PA-C   Triage Vitals: BP 118/66 mmHg  Pulse 66  Temp(Src) 98 F (36.7 C) (Oral)  Resp 24  SpO2 99%    Physical Exam  Constitutional: He is oriented to person, place, and time. He appears well-developed and well-nourished.  HENT:  Head: Normocephalic and atraumatic.  Nose: Nose normal.  Mouth/Throat: Oropharynx is clear and moist.  Eyes: Conjunctivae and EOM are normal. Pupils are equal, round, and reactive to light.  Neck: Normal range of motion. Neck supple. No JVD present. No tracheal deviation present. No thyromegaly present.  Cardiovascular: Normal rate, regular rhythm, normal heart sounds and intact distal pulses.  Exam reveals no gallop and no friction rub.   No murmur heard. Pulmonary/Chest: Effort normal and breath sounds normal. No stridor. No respiratory distress. He has no wheezes. He has no rales. He exhibits no tenderness.  Abdominal: Soft. Bowel sounds are normal. He exhibits no distension and no mass. There is no tenderness. There is no rebound and no guarding.  Musculoskeletal: Normal range of motion. He exhibits no edema or tenderness.  Lymphadenopathy:    He has no cervical adenopathy.  Neurological: He is alert and oriented to person, place, and time. He displays normal reflexes. No cranial nerve deficit. He exhibits normal muscle tone. Coordination normal.  Skin: Skin is warm and dry. No rash noted. No erythema. No pallor.  Psychiatric: He has a normal mood and affect. His behavior is normal. Judgment and thought content normal.  Nursing note and vitals reviewed.   ED Course  Procedures (including critical care time)  DIAGNOSTIC STUDIES: Oxygen Saturation is 99% on RA, Normal by my interpretation.    COORDINATION OF CARE: 11:15 PM- Will give ASA chewable. Will order CXR, BNP, troponin I, BMP, and CBC. Discussed treatment plan with pt at bedside and pt agreed to plan.     Labs Review Labs  Reviewed  BASIC METABOLIC PANEL - Abnormal; Notable for the following:    Sodium 133 (*)    Chloride 98 (*)    All other components within normal limits  CBC WITH DIFFERENTIAL/PLATELET - Abnormal; Notable for the following:    MCH 34.6 (*)    MCHC 36.3 (*)    All other components within normal limits  TROPONIN I  BRAIN NATRIURETIC PEPTIDE    Imaging Review Dg Chest 2 View  04/30/2015   CLINICAL DATA:  Acute onset of generalized chest pain. Initial encounter.  EXAM: CHEST  2 VIEW  COMPARISON:  Chest radiograph performed 04/13/2015  FINDINGS: The lungs are well-aerated. Mild peribronchial thickening is noted. A 1.2 cm nodule is noted at the left lung base. There is no evidence of pleural effusion or pneumothorax.  The heart is normal in size; the mediastinal contour is within normal limits. No acute osseous abnormalities are seen. Degenerative change is noted at the acromioclavicular joints bilaterally.  IMPRESSION: 1. Mild  peribronchial thickening noted; lungs otherwise clear. 2. 1.2 cm nodular density at the left lung base. As previously suggested, repeat chest radiograph with nipple markers would be helpful for further evaluation.   Electronically Signed   By: Roanna Raider M.D.   On: 04/30/2015 00:34     EKG Interpretation  Date/Time:  Thursday April 29 2015 23:54:12 EDT Ventricular Rate:  61 PR Interval:  128 QRS Duration: 125 QT Interval:  449 QTC Calculation: 452 R Axis:   -61 Text Interpretation:  Sinus rhythm Nonspecific IVCD with LAD Nonspecific T abnormalities, inferior leads Baseline wander in lead(s) V1 new t wave inversions inferiorly/laterally Confirmed by Cadi Rhinehart  MD, Leshawn Straka ([id number]) on 04/30/2015 12:01:04 AM        MDM   Final diagnoses:  Chest pain, unspecified chest pain type  Chronic systolic CHF (congestive heart failure)  CAD, multiple vessel    64 yo male with CAD, awaiting re-eval from CT surgery.  Chest pain this afternoon, again tonight.  Pt attributes sxs to  eating foods with potassium.  Having trouble with BP meds.  Will get labs, d/w cardiology.  I personally performed the services described in this documentation, which was scribed in my presence. The recorded information has been reviewed and is accurate.  Cardiology and hospitalist consultation for admission for this patient.  Patient has decided that despite significant risks for serious repercussions from his chest pain to include cardiac arrest.  He does not wish to be admitted to the hospital.  Cardiology and myself have discussed risks of leaving AGAINST MEDICAL ADVICE with the patient.  He does have follow-up scheduled with his cardiologist and cardiothoracic surgeon.  Patient is able to repeat back risks of leaving AGAINST MEDICAL ADVICE.  Casey Severin, MD 04/30/15 [phone]

## 2015-04-30 ENCOUNTER — Emergency Department (HOSPITAL_COMMUNITY): Payer: Medicare Other

## 2015-04-30 ENCOUNTER — Encounter (HOSPITAL_COMMUNITY): Payer: Self-pay | Admitting: Emergency Medicine

## 2015-04-30 ENCOUNTER — Inpatient Hospital Stay (HOSPITAL_COMMUNITY)
Admission: EM | Admit: 2015-04-30 | Discharge: 2015-05-01 | DRG: 311 | Disposition: A | Discharge status: Home / Self Care | Payer: Medicare Other | Attending: Internal Medicine | Admitting: Internal Medicine

## 2015-04-30 DIAGNOSIS — H9190 Unspecified hearing loss, unspecified ear: Secondary | ICD-10-CM | POA: Diagnosis present

## 2015-04-30 DIAGNOSIS — R0789 Other chest pain: Secondary | ICD-10-CM | POA: Diagnosis not present

## 2015-04-30 DIAGNOSIS — E785 Hyperlipidemia, unspecified: Secondary | ICD-10-CM | POA: Diagnosis present

## 2015-04-30 DIAGNOSIS — M1990 Unspecified osteoarthritis, unspecified site: Secondary | ICD-10-CM | POA: Diagnosis present

## 2015-04-30 DIAGNOSIS — I2510 Atherosclerotic heart disease of native coronary artery without angina pectoris: Secondary | ICD-10-CM | POA: Diagnosis present

## 2015-04-30 DIAGNOSIS — Z880 Allergy status to penicillin: Secondary | ICD-10-CM | POA: Diagnosis not present

## 2015-04-30 DIAGNOSIS — Z955 Presence of coronary angioplasty implant and graft: Secondary | ICD-10-CM | POA: Diagnosis not present

## 2015-04-30 DIAGNOSIS — I959 Hypotension, unspecified: Secondary | ICD-10-CM

## 2015-04-30 DIAGNOSIS — I249 Acute ischemic heart disease, unspecified: Secondary | ICD-10-CM | POA: Diagnosis present

## 2015-04-30 DIAGNOSIS — R079 Chest pain, unspecified: Secondary | ICD-10-CM

## 2015-04-30 DIAGNOSIS — Z720 Tobacco use: Secondary | ICD-10-CM

## 2015-04-30 DIAGNOSIS — I5022 Chronic systolic (congestive) heart failure: Secondary | ICD-10-CM | POA: Diagnosis present

## 2015-04-30 DIAGNOSIS — Z7982 Long term (current) use of aspirin: Secondary | ICD-10-CM | POA: Diagnosis not present

## 2015-04-30 DIAGNOSIS — I200 Unstable angina: Secondary | ICD-10-CM

## 2015-04-30 DIAGNOSIS — I255 Ischemic cardiomyopathy: Secondary | ICD-10-CM | POA: Diagnosis present

## 2015-04-30 DIAGNOSIS — I252 Old myocardial infarction: Secondary | ICD-10-CM | POA: Diagnosis not present

## 2015-04-30 DIAGNOSIS — Z79899 Other long term (current) drug therapy: Secondary | ICD-10-CM | POA: Diagnosis not present

## 2015-04-30 DIAGNOSIS — F17210 Nicotine dependence, cigarettes, uncomplicated: Secondary | ICD-10-CM | POA: Diagnosis present

## 2015-04-30 LAB — CBC WITH DIFFERENTIAL/PLATELET
Basophils Absolute: 0 10*3/uL (ref 0.0–0.1)
Basophils Relative: 0 % (ref 0–1)
EOS ABS: 0.2 10*3/uL (ref 0.0–0.7)
EOS PCT: 3 % (ref 0–5)
HCT: 42.2 % (ref 39.0–52.0)
HEMOGLOBIN: 15.3 g/dL (ref 13.0–17.0)
Lymphocytes Relative: 31 % (ref 12–46)
Lymphs Abs: 2.5 10*3/uL (ref 0.7–4.0)
MCH: 34.6 pg — ABNORMAL HIGH (ref 26.0–34.0)
MCHC: 36.3 g/dL — ABNORMAL HIGH (ref 30.0–36.0)
MCV: 95.5 fL (ref 78.0–100.0)
MONO ABS: 0.5 10*3/uL (ref 0.1–1.0)
Monocytes Relative: 6 % (ref 3–12)
NEUTROS ABS: 4.7 10*3/uL (ref 1.7–7.7)
NEUTROS PCT: 60 % (ref 43–77)
Platelets: 221 10*3/uL (ref 150–400)
RBC: 4.42 MIL/uL (ref 4.22–5.81)
RDW: 13.3 % (ref 11.5–15.5)
WBC: 8 10*3/uL (ref 4.0–10.5)

## 2015-04-30 LAB — BASIC METABOLIC PANEL
ANION GAP: 9 (ref 5–15)
Anion gap: 8 (ref 5–15)
BUN: 6 mg/dL (ref 6–20)
BUN: 6 mg/dL (ref 6–20)
CALCIUM: 9.2 mg/dL (ref 8.9–10.3)
CALCIUM: 9 mg/dL (ref 8.9–10.3)
CHLORIDE: 98 mmol/L — AB (ref 101–111)
CHLORIDE: 102 mmol/L (ref 101–111)
CO2: 26 mmol/L (ref 22–32)
CO2: 26 mmol/L (ref 22–32)
Creatinine, Ser: 1 mg/dL (ref 0.61–1.24)
Creatinine, Ser: 0.89 mg/dL (ref 0.61–1.24)
GFR calc Af Amer: >60 mL/min (ref >60)
GFR calc Af Amer: >60 mL/min (ref >60)
GFR calc non Af Amer: >60 mL/min (ref >60)
GLUCOSE: 102 mg/dL — AB (ref 65–99)
Glucose, Bld: 93 mg/dL (ref 65–99)
Potassium: 3.6 mmol/L (ref 3.5–5.1)
Potassium: 3.5 mmol/L (ref 3.5–5.1)
Sodium: 133 mmol/L — ABNORMAL LOW (ref 135–145)
Sodium: 136 mmol/L (ref 135–145)

## 2015-04-30 LAB — PROTIME-INR
INR: 1.1 (ref 0.00–1.49)
Prothrombin Time: 14.4 seconds (ref 11.6–15.2)

## 2015-04-30 LAB — CBC
HEMATOCRIT: 40.4 % (ref 39.0–52.0)
HEMOGLOBIN: 14 g/dL (ref 13.0–17.0)
MCH: 33.3 pg (ref 26.0–34.0)
MCHC: 34.7 g/dL (ref 30.0–36.0)
MCV: 96 fL (ref 78.0–100.0)
Platelets: 214 10*3/uL (ref 150–400)
RBC: 4.21 MIL/uL — AB (ref 4.22–5.81)
RDW: 13.5 % (ref 11.5–15.5)
WBC: 6.5 10*3/uL (ref 4.0–10.5)

## 2015-04-30 LAB — I-STAT TROPONIN, ED: Troponin i, poc: 0.03 ng/mL (ref 0.00–0.08)

## 2015-04-30 LAB — BRAIN NATRIURETIC PEPTIDE: B Natriuretic Peptide: 44.5 pg/mL (ref 0.0–100.0)

## 2015-04-30 LAB — TROPONIN I: Troponin I: <0.03 ng/mL (ref <0.031)

## 2015-04-30 LAB — APTT: aPTT: 33 seconds (ref 24–37)

## 2015-04-30 MED ORDER — SODIUM CHLORIDE 0.9 % IV SOLN
1000.0000 mL | INTRAVENOUS | Status: DC
  Administered 2015-04-30: 1000 mL via INTRAVENOUS

## 2015-04-30 MED ORDER — ASPIRIN 81 MG PO CHEW
324.0000 mg | CHEWABLE_TABLET | Freq: Once | ORAL | Status: DC
  Filled 2015-04-30: qty 4

## 2015-04-30 MED ORDER — ENOXAPARIN SODIUM 80 MG/0.8ML ~~LOC~~ SOLN
80.0000 mg | Freq: Two times a day (BID) | SUBCUTANEOUS | Status: DC
  Filled 2015-04-30: qty 0.8

## 2015-04-30 MED ORDER — NITROGLYCERIN 2 % TD OINT
1.0000 [in_us] | TOPICAL_OINTMENT | Freq: Once | TRANSDERMAL | Status: AC
  Administered 2015-04-30: 1 [in_us] via TOPICAL
  Filled 2015-04-30: qty 1

## 2015-04-30 NOTE — ED Notes (Signed)
MD at bedside. Cardiology 

## 2015-04-30 NOTE — ED Notes (Signed)
Patient states. " I do not want to stay I want you take all these things( EKG leads) off of me so that I can go home." Patient made aware of risk. MD Norlene Campbelltter made aware. Cardiology MD saw patient, patient advised cardiology he is not going to stay he is going home.

## 2015-04-30 NOTE — Progress Notes (Signed)
ANTICOAGULATION CONSULT NOTE - Initial Consult  Pharmacy Consult for lovenox Indication: chest pain/ACS  Allergies  Allergen Reactions  . Penicillins Swelling    Patient Measurements: Height: 5\' 11"  (180.3 cm) Weight: 185 lb (83.915 kg) IBW/kg (Calculated) : 75.3 Heparin Dosing Weight:   Vital Signs: Temp: 98 F (36.7 C) (06/03 1943) Temp Source: Oral (06/03 1943) BP: 94/63 mmHg (06/03 2200) Pulse Rate: 59 (06/03 2230)  Labs:  Recent Labs  04/29/15 2308 04/30/15 2115  HGB 15.3 14.0  HCT 42.2 40.4  PLT 221 214  APTT  --  33  LABPROT  --  14.4  INR  --  1.10  CREATININE 1.00 0.89  TROPONINI <0.03  --     Estimated Creatinine Clearance: 90.5 mL/min (by C-G formula based on Cr of 0.89).   Medical History: Past Medical History  Diagnosis Date  . Osteoarthritis   . Coronary artery disease     a. Big NSTEMI 11/2014: cath with surgical disease but patient initially refused CABG. Compromise decision was made to do intervention on the RCA with a bare metal stent with medical therapy for other disease and follow-up as an outpatient.  . Myocardial infarction     NSTEMI  . HOH (hard of hearing)   . Tobacco abuse   . Ischemic cardiomyopathy     a. a. Cath 12/22/14: EF 25%. b. 2D Echo 12/23/14: EF 25-30%, diffuse hypokinesis, akinesis of the entireinferolateral and inferior myocardium, mild MR.  [name]. Hyperlipidemia   . Transaminitis     Assessment: [age] yom presented to the ED with CP. To start IV heparin for anticoagulation. Baseline CBC and INR is WNL. He is not on anticoagulation PTA.   Goal of Therapy:  Anti-Xa level 0.6-1 units/ml 4hrs after LMWH dose given Monitor platelets by anticoagulation protocol: Yes   Plan:  - Lovenox 80mg  SQ Q12H - CBC Q72H  Elenora Hawbaker, Drake Leachachel Lynn 04/30/2015,10:35 PM

## 2015-04-30 NOTE — ED Provider Notes (Signed)
CSN: [id number]     Arrival date & time 04/30/15  1938 History   First MD Initiated Contact with Patient 04/30/15 1950     Chief Complaint  Patient presents with  . Chest Pain    HPI Comments: Pt noticed the sx yesterday.  He was seen in the ED and they recommended admission.  Pt had things to take care of at home.  Sx started again today so he came back to the ED.  Patient is a 64 y.o. male presenting with chest pain. The history is provided by the patient.  Chest Pain Pain location:  L chest Pain quality comment:  A spasm Pain severity:  Severe Onset quality:  Sudden Duration: lasts for seconds. Timing:  Intermittent Context comment:  Seems to occur at rest and at night, it woke him up last night Relieved by:  Nothing Worsened by:  Nothing tried Associated symptoms: no cough, no fever, no lower extremity edema, no nausea, no shortness of breath and not vomiting   Associated symptoms comment:  Lighteaded  Risk factors: coronary artery disease   THis episode feels similar to his prior issues but have not lasted as long and he was sweaty with that episode.  Past Medical History  Diagnosis Date  . Osteoarthritis   . Coronary artery disease     a. Big NSTEMI 11/2014: cath with surgical disease but patient initially refused CABG. Compromise decision was made to do intervention on the RCA with a bare metal stent with medical therapy for other disease and follow-up as an outpatient.  . Myocardial infarction     NSTEMI  . HOH (hard of hearing)   . Tobacco abuse   . Ischemic cardiomyopathy     a. a. Cath 12/22/14: EF 25%. b. 2D Echo 12/23/14: EF 25-30%, diffuse hypokinesis, akinesis of the entireinferolateral and inferior myocardium, mild MR.  [name] Hyperlipidemia   . Transaminitis    Past Surgical History  Procedure Laterality Date  . Left heart catheterization with coronary angiogram N/A 12/22/2014    Procedure: LEFT HEART CATHETERIZATION WITH CORONARY ANGIOGRAM;  Surgeon: Corky Crafts, MD; LAD 100%, CFX 90%, RI mild dz, RCA 95%/70%, EF 25%, CABG recommended  . Percutaneous coronary stent intervention (pci-s) N/A 12/23/2014    Procedure: PERCUTANEOUS CORONARY STENT INTERVENTION (PCI-S);  Surgeon: Corky Crafts, MD; 3.0 x 24 rebel BMS to the RCA    Family History  Problem Relation Age of Onset  . Hypertension    . Heart attack Neg Hx   . Stroke Neg Hx   . Cancer Father    History  Substance Use Topics  . Smoking status: Current Every Day Smoker -- 0.50 packs/day for 50 years    Types: Cigarettes  . Smokeless tobacco: Never Used  . Alcohol Use: No    Review of Systems  Constitutional: Negative for fever.  Respiratory: Negative for cough and shortness of breath.   Cardiovascular: Positive for chest pain.  Gastrointestinal: Negative for nausea and vomiting.  All other systems reviewed and are negative.     Allergies  Penicillins  Home Medications   Prior to Admission medications   Medication Sig Start Date End Date Taking? Authorizing Provider  aspirin 81 MG tablet Take 81 mg by mouth daily.   Yes Historical Provider, MD  atorvastatin (LIPITOR) 40 MG tablet Take 1 tablet by mouth daily. 03/21/15  Yes Historical Provider, MD  nitroGLYCERIN (NITROSTAT) 0.4 MG SL tablet Place 1 tablet (0.4 mg total) under the tongue  every 5 (five) minutes as needed for chest pain. 12/24/14  Yes Rhonda G Barrett, PA-C  prasugrel (EFFIENT) 10 MG TABS tablet Take 1 tablet (10 mg total) by mouth daily. 12/24/14  Yes Rhonda G Barrett, PA-C  lisinopril (PRINIVIL,ZESTRIL) 2.5 MG tablet Take 1 tablet (2.5 mg total) by mouth daily. Patient not taking: Reported on 04/29/2015 12/24/14   Joline Salt Barrett, PA-C   BP 105/63 mmHg  Pulse 62  Temp(Src) 98 F (36.7 C) (Oral)  Resp 24  Ht  (1.803 m)  Wt 185 lb (83.915 kg)  BMI 25.81 kg/m2  SpO2 97% Physical Exam  Constitutional: He appears well-developed and well-nourished. No distress.  HENT:  Head: Normocephalic and  atraumatic.  Right Ear: External ear normal.  Left Ear: External ear normal.  Eyes: Conjunctivae are normal. Right eye exhibits no discharge. Left eye exhibits no discharge. No scleral icterus.  Neck: Neck supple. No tracheal deviation present.  Cardiovascular: Normal rate, regular rhythm and intact distal pulses.   Pulmonary/Chest: Effort normal and breath sounds normal. No stridor. No respiratory distress. He has no wheezes. He has no rales.  Abdominal: Soft. Bowel sounds are normal. He exhibits no distension. There is no tenderness. There is no rebound and no guarding.  Musculoskeletal: He exhibits no edema or tenderness.  Neurological: He is alert. He has normal strength. No cranial nerve deficit (no facial droop, extraocular movements intact, no slurred speech) or sensory deficit. He exhibits normal muscle tone. He displays no seizure activity. Coordination normal.  Skin: Skin is warm and dry. No rash noted.  Psychiatric: He has a normal mood and affect.  Nursing note and vitals reviewed.   ED Course  Procedures (including critical care time) Labs Review Labs Reviewed  BASIC METABOLIC PANEL - Abnormal; Notable for the following:    Glucose, Bld 102 (*)    All other components within normal limits  CBC - Abnormal; Notable for the following:    RBC 4.21 (*)    All other components within normal limits  APTT  PROTIME-INR  I-STAT TROPOININ, ED  Rosezena Sensor, ED    Imaging Review Dg Chest 2 View  04/30/2015   CLINICAL DATA:  Acute onset of generalized chest pain. Initial encounter.  EXAM: CHEST  2 VIEW  COMPARISON:  Chest radiograph performed 04/13/2015  FINDINGS: The lungs are well-aerated. Mild peribronchial thickening is noted. A 1.2 cm nodule is noted at the left lung base. There is no evidence of pleural effusion or pneumothorax.  The heart is normal in size; the mediastinal contour is within normal limits. No acute osseous abnormalities are seen. Degenerative change is noted  at the acromioclavicular joints bilaterally.  IMPRESSION: 1. Mild peribronchial thickening noted; lungs otherwise clear. 2. 1.2 cm nodular density at the left lung base. As previously suggested, repeat chest radiograph with nipple markers would be helpful for further evaluation.   Electronically Signed   By: Roanna Raider M.D.   On: 04/30/2015 00:34   Dg Chest Portable 1 View  04/30/2015   CLINICAL DATA:  Chest pain and shortness of breath. Previous myocardial infarction. Hypertension.  EXAM: PORTABLE CHEST - 1 VIEW  COMPARISON:  04/29/2015  FINDINGS: The heart size and mediastinal contours are within normal limits. Both lungs are clear. The visualized skeletal structures are unremarkable.  IMPRESSION: No active disease.   Electronically Signed   By: Myles Rosenthal M.D.   On: 04/30/2015 21:02     EKG Interpretation   Date/Time:  Friday April 30 2015 19:42:03 EDT Ventricular Rate:  71 PR Interval:  126 QRS Duration: 120 QT Interval:  409 QTC Calculation: 444 R Axis:   -68 Text Interpretation:  Sinus rhythm Nonspecific IVCD with LAD Anteroseptal  infarct, old Nonspecific T abnormalities, lateral leads No significant  change since last tracing Confirmed by Zaeden Lastinger  MD-J, Julizza Sassone ([id number]) on  04/30/2015 7:51:24 PM     Medications  0.9 %  sodium chloride infusion (1,000 mLs Intravenous New Bag/Given 04/30/15 2024)  aspirin chewable tablet 324 mg (324 mg Oral Not Given 04/30/15 2020)  enoxaparin (LOVENOX) injection 80 mg (not administered)  nitroGLYCERIN (NITROGLYN) 2 % ointment 1 inch (1 inch Topical Given 04/30/15 2024)    MDM   Final diagnoses:  Unstable angina    Patient has known history of coronary artery disease involving the LAD, circumflex and RCA.  He had a cardiac catheterization in January where coronary bypass grafting was recommended. At that time, the patient did not want to have that procedure.  He started having symptoms again. His cardiologist has referred him back to a Cardiothoracic  surgeon.   His symptoms are concerning for angina although he is having atypical features in that he feels like lying flat makes it worse and he has not noticed anything with exertion.   I will consult with cardiology   Linwood DibblesJon Daris Aristizabal, MD 04/30/15 2237

## 2015-04-30 NOTE — H&P (Signed)
Cardiology History and Physical  PCP: Philis Fendt, MD Cardiologist: Dr. Peter Martinique  History of Present Illness (and review of medical records): Casey Jackson is a 64 y.o. male who presents for evaluation of chest pain.  He is followed by Dr. Martinique.  He has been seen in ED one day prior and refused further evaluation and was seen again in ED 2 wks prior.  He has know hx of NSTEMI in 11/2014, ischemic CMP Ef 25-30%, HLD, continued tobacco abuse.  Cath in Jan revealed 3v CAD and CABG was recommended, however, patient refused at that time due to social issues.  He therefore was taken back to Cath lab and underwent PCI of RCA with BMS.  Patient also declined lifevest.  He has been on Aspirin and Effient. He has not tolerated BBs and recently had ACEi stopped due to hypotension.  When he last saw Dr. Barrett Shell in clinic, he wanted to proceed with CABG evaluation due to continued symptoms.  He presented to ED yesterday with chest pain, however, declined further evaluation.  He now returns with 7/10, mid sternal chest pain which he describes as a dull ache with occasional spasms.  He reports feeling general weak/fatigued, otherwise denies any associated symptoms.  He stated he took NTG at home with some relief.  He called EMS for further evaluation.  He was given ASA and nitropaste in ED.  He is now chest pain free.    Review of Systems Further review of systems was otherwise negative other than stated in HPI.  Past Medical History  Diagnosis Date  . Osteoarthritis   . Coronary artery disease     a. Big NSTEMI 11/2014: cath with surgical disease but patient initially refused CABG. Compromise decision was made to do intervention on the RCA with a bare metal stent with medical therapy for other disease and follow-up as an outpatient.  . Myocardial infarction     NSTEMI  . HOH (hard of hearing)   . Tobacco abuse   . Ischemic cardiomyopathy     a. a. Cath 12/22/14: EF 25%. b. 2D Echo 12/23/14: EF 25-30%,  diffuse hypokinesis, akinesis of the entireinferolateral and inferior myocardium, mild MR.  [name] Hyperlipidemia   . Transaminitis     Past Surgical History  Procedure Laterality Date  . Left heart catheterization with coronary angiogram N/A 12/22/2014    Procedure: LEFT HEART CATHETERIZATION WITH CORONARY ANGIOGRAM;  Surgeon: Jettie Booze, MD; LAD 100%, CFX 90%, RI mild dz, RCA 95%/70%, EF 25%, CABG recommended  . Percutaneous coronary stent intervention (pci-s) N/A 12/23/2014    Procedure: PERCUTANEOUS CORONARY STENT INTERVENTION (PCI-S);  Surgeon: Jettie Booze, MD; 3.0 x 24 rebel BMS to the RCA      (Not in a hospital admission) Allergies  Allergen Reactions  . Penicillins Swelling    History  Substance Use Topics  . Smoking status: Current Every Day Smoker -- 0.50 packs/day for 50 years    Types: Cigarettes  . Smokeless tobacco: Never Used  . Alcohol Use: No    Family History  Problem Relation Age of Onset  . Hypertension    . Heart attack Neg Hx   . Stroke Neg Hx   . Cancer Father      Objective:  Patient Vitals for the past 8 hrs:  BP Temp Temp src Pulse Resp SpO2 Height Weight  04/30/15 2330 94/62 mmHg - - 63 (!) 8 97 % - -  04/30/15 2230 - - - (!) 59 20  98 % - -  04/30/15 2200 94/63 mmHg - - 65 20 98 % - -  04/30/15 2145 105/63 mmHg - - 62 24 97 % - -  04/30/15 2106 (!) 102/52 mmHg - - 66 25 97 % - -  04/30/15 2100 (!) 102/52 mmHg - - 67 20 96 % - -  04/30/15 2045 (!) 102/54 mmHg - - 66 22 97 % - -  04/30/15 2015 (!) 119/51 mmHg - - 70 21 98 % - -  04/30/15 2000 101/56 mmHg - - 69 22 96 % - -  04/30/15 1945 115/60 mmHg - - 71 20 97 % - -  04/30/15 1943 110/58 mmHg 98 F (36.7 C) Oral 78 19 97 % _0  (1.803 m) 83.915 kg (185 lb)  04/30/15 1938 - - - - - 94 % - -   General appearance: alert, cooperative, appears stated age and no distress Head: Normocephalic, without obvious abnormality, atraumatic Eyes: conjunctivae/corneas clear. PERRL, EOM's  intact. Neck: no carotid bruit, no JVD and supple, Lungs: clear to auscultation bilaterally Chest wall: no tenderness Heart: regular rate and rhythm, S1, S2 normal, no murmur, click, rub or gallop Abdomen: soft, non-tender; bowel sounds normal Extremities: extremities normal, atraumatic, no cyanosis or edema Pulses: 2+ and symmetric Neurologic: Grossly normal  Results for orders placed or performed during the hospital encounter of 04/30/15 (from the past 48 hour(s))  Basic metabolic panel     Status: Abnormal   Collection Time: 04/30/15  9:15 PM  Result Value Ref Range   Sodium 136 135 - 145 mmol/L   Potassium 3.5 3.5 - 5.1 mmol/L   Chloride 102 101 - 111 mmol/L   CO2 26 22 - 32 mmol/L   Glucose, Bld 102 (H) 65 - 99 mg/dL   BUN 6 6 - 20 mg/dL   Creatinine, Ser 0.89 0.61 - 1.24 mg/dL   Calcium 9.0 8.9 - 10.3 mg/dL   GFR calc non Af Amer >60 >60 mL/min   GFR calc Af Amer >60 >60 mL/min    Comment: (NOTE) The eGFR has been calculated using the CKD EPI equation. This calculation has not been validated in all clinical situations. eGFR's persistently <60 mL/min signify possible Chronic Kidney Disease.    Anion gap 8 5 - 15  CBC     Status: Abnormal   Collection Time: 04/30/15  9:15 PM  Result Value Ref Range   WBC 6.5 4.0 - 10.5 K/uL   RBC 4.21 (L) 4.22 - 5.81 MIL/uL   Hemoglobin 14.0 13.0 - 17.0 g/dL   HCT 40.4 39.0 - 52.0 %   MCV 96.0 78.0 - 100.0 fL   MCH 33.3 26.0 - 34.0 pg   MCHC 34.7 30.0 - 36.0 g/dL   RDW 13.5 11.5 - 15.5 %   Platelets 214 150 - 400 K/uL  APTT     Status: None   Collection Time: 04/30/15  9:15 PM  Result Value Ref Range   aPTT 33 24 - 37 seconds  Protime-INR     Status: None   Collection Time: 04/30/15  9:15 PM  Result Value Ref Range   Prothrombin Time 14.4 11.6 - 15.2 seconds   INR 1.10 0.00 - 1.49  I-stat troponin, ED  (not at [facility])     Status: None   Collection Time: 04/30/15  9:33 PM  Result Value Ref Range   Troponin i, poc 0.03 0.00  - 0.08 ng/mL   Comment 3  Comment: Due to the release kinetics of cTnI, a negative result within the first hours of the onset of symptoms does not rule out myocardial infarction with certainty. If myocardial infarction is still suspected, repeat the test at appropriate intervals.    Dg Chest 2 View  04/30/2015   CLINICAL DATA:  Acute onset of generalized chest pain. Initial encounter.  EXAM: CHEST  2 VIEW  COMPARISON:  Chest radiograph performed 04/13/2015  FINDINGS: The lungs are well-aerated. Mild peribronchial thickening is noted. A 1.2 cm nodule is noted at the left lung base. There is no evidence of pleural effusion or pneumothorax.  The heart is normal in size; the mediastinal contour is within normal limits. No acute osseous abnormalities are seen. Degenerative change is noted at the acromioclavicular joints bilaterally.  IMPRESSION: 1. Mild peribronchial thickening noted; lungs otherwise clear. 2. 1.2 cm nodular density at the left lung base. As previously suggested, repeat chest radiograph with nipple markers would be helpful for further evaluation.   Electronically Signed   By: Garald Balding M.D.   On: 04/30/2015 00:34   Dg Chest Portable 1 View  04/30/2015   CLINICAL DATA:  Chest pain and shortness of breath. Previous myocardial infarction. Hypertension.  EXAM: PORTABLE CHEST - 1 VIEW  COMPARISON:  04/29/2015  FINDINGS: The heart size and mediastinal contours are within normal limits. Both lungs are clear. The visualized skeletal structures are unremarkable.  IMPRESSION: No active disease.   Electronically Signed   By: Earle Gell M.D.   On: 04/30/2015 21:02    ECG:  HR 71 sinus rhythm, non-specific IVCD with LAD, similar TWAs, prior anteroseptal infarct  Assessment: 58 M with complex cardiac history as detailed above presents with recurrent chest pain.  Per Dr. Doug Sou last clinic note, He has CTO of the LAD with collaterals. There is severe disease in the LCX which supplies  2 marginal branches. A critical lesion in the mid RCA was stented successfully but there was a severe ulcerated stenosis in the distal RCA prior to the bifurcation. Suspect chest pain 2/2 known multivessel CAD.   CAD s/p NSTEMI, PCI to RCA,  Ischemic CMP EF 25-30% HLD Tobacco abuse  Plan: 1. Cardiology Admission  2. Continuous monitoring on Telemetry. 3. Repeat ekg on admit, prn chest pain or arrythmia 4. Trend cardiac biomarkers 5. Medical management to include ASA, Effient, Statin. 6. BB and ACEi held due to hypotension.  Given hypotension not likely to tolerate long acting nitrates. 7. Tobacco cessation 8. Reassess in am, discuss pursuing CABG.

## 2015-04-30 NOTE — ED Notes (Signed)
Pt states while laying down this evening he had a sudden onset of left sided chest pain that was sharp. Pt took 324mg  ASA and 1 nitro tablet. Pt denies any pain at this time. Pt states pain only happens if he lays down. Pt was seen in ED yesterday for same and signed out AMA.

## 2015-05-01 ENCOUNTER — Encounter (HOSPITAL_COMMUNITY): Payer: Self-pay | Admitting: *Deleted

## 2015-05-01 DIAGNOSIS — R0789 Other chest pain: Secondary | ICD-10-CM

## 2015-05-01 DIAGNOSIS — I249 Acute ischemic heart disease, unspecified: Secondary | ICD-10-CM | POA: Diagnosis present

## 2015-05-01 DIAGNOSIS — R079 Chest pain, unspecified: Secondary | ICD-10-CM

## 2015-05-01 LAB — PROTIME-INR
INR: 1.11 (ref 0.00–1.49)
PROTHROMBIN TIME: 14.5 s (ref 11.6–15.2)

## 2015-05-01 LAB — I-STAT TROPONIN, ED: Troponin i, poc: 0.05 ng/mL (ref 0.00–0.08)

## 2015-05-01 LAB — BASIC METABOLIC PANEL
Anion gap: 7 (ref 5–15)
BUN: 6 mg/dL (ref 6–20)
CHLORIDE: 102 mmol/L (ref 101–111)
CO2: 30 mmol/L (ref 22–32)
Calcium: 9.1 mg/dL (ref 8.9–10.3)
Creatinine, Ser: 0.96 mg/dL (ref 0.61–1.24)
GFR calc Af Amer: >60 mL/min (ref >60)
GFR calc non Af Amer: >60 mL/min (ref >60)
Glucose, Bld: 93 mg/dL (ref 65–99)
POTASSIUM: 4.7 mmol/L (ref 3.5–5.1)
Sodium: 139 mmol/L (ref 135–145)

## 2015-05-01 LAB — CBC
HCT: 38.9 % — ABNORMAL LOW (ref 39.0–52.0)
Hemoglobin: 13.7 g/dL (ref 13.0–17.0)
MCH: 34 pg (ref 26.0–34.0)
MCHC: 35.2 g/dL (ref 30.0–36.0)
MCV: 96.5 fL (ref 78.0–100.0)
Platelets: 198 10*3/uL (ref 150–400)
RBC: 4.03 MIL/uL — AB (ref 4.22–5.81)
RDW: 13.6 % (ref 11.5–15.5)
WBC: 5.4 10*3/uL (ref 4.0–10.5)

## 2015-05-01 LAB — TROPONIN I: TROPONIN I: 0.03 ng/mL (ref <0.031)

## 2015-05-01 MED ORDER — NITROGLYCERIN 0.4 MG SL SUBL: 0.4000 mg | SUBLINGUAL_TABLET | SUBLINGUAL | Status: DC | PRN

## 2015-05-01 MED ORDER — ATORVASTATIN CALCIUM 40 MG PO TABS
40.0000 mg | ORAL_TABLET | Freq: Every day | ORAL | Status: DC
  Filled 2015-05-01 (×2): qty 1

## 2015-05-01 MED ORDER — SODIUM CHLORIDE 0.9 % IV SOLN: 250.0000 mL | INTRAVENOUS | Status: DC | PRN

## 2015-05-01 MED ORDER — ACETAMINOPHEN 325 MG PO TABS: 650.0000 mg | ORAL_TABLET | ORAL | Status: DC | PRN

## 2015-05-01 MED ORDER — PRASUGREL HCL 10 MG PO TABS
10.0000 mg | ORAL_TABLET | Freq: Every day | ORAL | Status: DC
  Administered 2015-05-01: 10 mg via ORAL
  Filled 2015-05-01: qty 1

## 2015-05-01 MED ORDER — HEPARIN SODIUM (PORCINE) 5000 UNIT/ML IJ SOLN
5000.0000 [IU] | Freq: Three times a day (TID) | INTRAMUSCULAR | Status: DC
  Administered 2015-05-01: 5000 [IU] via SUBCUTANEOUS
  Filled 2015-05-01 (×3): qty 1

## 2015-05-01 MED ORDER — SODIUM CHLORIDE 0.9 % IJ SOLN
3.0000 mL | Freq: Two times a day (BID) | INTRAMUSCULAR | Status: DC
  Administered 2015-05-01: 3 mL via INTRAVENOUS

## 2015-05-01 MED ORDER — SODIUM CHLORIDE 0.9 % IJ SOLN: 3.0000 mL | INTRAMUSCULAR | Status: DC | PRN

## 2015-05-01 MED ORDER — ASPIRIN EC 81 MG PO TBEC
81.0000 mg | DELAYED_RELEASE_TABLET | Freq: Every day | ORAL | Status: DC
  Administered 2015-05-01: 81 mg via ORAL
  Filled 2015-05-01 (×2): qty 1

## 2015-05-01 MED ORDER — ONDANSETRON HCL 4 MG/2ML IJ SOLN: 4.0000 mg | Freq: Four times a day (QID) | INTRAMUSCULAR | Status: DC | PRN

## 2015-05-01 NOTE — Plan of Care (Signed)
Problem: Phase I Progression Outcomes Goal: Aspirin unless contraindicated Outcome: Completed/Met Date Met:  05/01/15 Taken at home prior to admit

## 2015-05-01 NOTE — Progress Notes (Addendum)
Patient ID: Jarret Torre, male   DOB: 01-15-1951, 64 y.o.   MRN: [id number]     Subjective:    No chest pain overnight  Objective:   Temp:  [97.8 F (36.6 C)-98.2 F (36.8 C)] 98.2 F (36.8 C) (06/04 1030) Pulse Rate:  [53-78] 53 (06/04 1030) Resp:  [8-25] 18 (06/04 1030) BP: (94-119)/(51-66) 102/58 mmHg (06/04 1030) SpO2:  [94 %-100 %] 96 % (06/04 1030) Weight:  [183 lb 6.4 oz (83.19 kg)-185 lb (83.915 kg)] 183 lb 6.4 oz (83.19 kg) (06/04 0112) Last BM Date: 04/30/15  Filed Weights   04/30/15 1943 05/01/15 0112  Weight: 185 lb (83.915 kg) 183 lb 6.4 oz (83.19 kg)    Intake/Output Summary (Last 24 hours) at 05/01/15 1220 Last data filed at 05/01/15 0835  Gross per 24 hour  Intake      0 ml  Output      0 ml  Net      0 ml    Telemetry: NSR  Exam:  General: NAD  Resp: CTAB  Cardiac:RRR, no m/r/g, no JVD  GI: abdomen soft, NT, ND  MSK: no LE edema  Neuro: no focal deficits    Lab Results:  Basic Metabolic Panel:  Recent Labs Lab 04/29/15 2308 04/30/15 2115 05/01/15 0428  NA 133* 136 139  K 3.6 3.5 4.7  CL 98* 102 102  CO2 GLUCOSE 93 102* 93  BUN CREATININE 1.00 0.89 0.96  CALCIUM 9.2 9.0 9.1    Liver Function Tests: No results for input(s): AST, ALT, ALKPHOS, BILITOT, PROT, ALBUMIN in the last 168 hours.  CBC:  Recent Labs Lab 04/29/15 2308 04/30/15 2115 05/01/15 0428  WBC 8.0 6.5 5.4  HGB 15.3 14.0 13.7  HCT 42.2 40.4 38.9*  MCV 95.5 96.0 96.5  PLT 221 214 198    Cardiac Enzymes:  Recent Labs Lab 04/29/15 2308 05/01/15 0040 05/01/15 0623  TROPONINI <0.03 <0.03 0.03    BNP: No results for input(s): PROBNP in the last 8760 hours.  Coagulation:  Recent Labs Lab 04/30/15 2115 05/01/15 0428  INR 1.10 1.11    ECG:   Medications:   Scheduled Medications: . aspirin  324 mg Oral Once  . aspirin EC  81 mg Oral Daily  . atorvastatin  40 mg Oral q1800  . heparin  5,000 Units Subcutaneous 3 times  per day  . prasugrel  10 mg Oral Daily  . sodium chloride  3 mL Intravenous Q12H     Infusions: . sodium chloride 1,000 mL (04/30/15 2024)     PRN Medications:  sodium chloride, acetaminophen, nitroGLYCERIN, ondansetron (ZOFRAN) IV, sodium chloride     Assessment/Plan    1. CAD - cath Jan 2016 LM patent, occluded LAD that fills with left to left collaterals, severe LCX disease, severe RCA disease in mid and distal vessel. He refused CABG and underwent PCI of mid RCA lesion with a BMS Jan 27,2016.   - intolerant to beta blcokers, from notes overall antianginal therapy has been limited due to low bp's and side effects. Lisionpril previously stopped.  - Jan 2016 echo LVEF 25-30%  - trop neg x3, EKG sinus brady with LAFB, diffuse TWI noted in prior EKG Mar 01 2015, but appear to be intermittent as not seen in all old EKGs.  - patient is followed closely by Dr Swaziland as outpatient. He understands the risks in continued delay of surgery including MI or death, and is working  on his social situation to get to the point he is ready to have surgery.  - patient is adamant about going home today, he has f/u with Dr SwazilandJordan Monday and Dr Laneta SimmersBartle on the 10th in preperation of his surgery. Will plan for discharge, will defer timing to stopping effient to Dr SwazilandJordan.    Dina RichJonathan Tulio Facundo, M.D.

## 2015-05-01 NOTE — Discharge Summary (Signed)
Physician Discharge Summary  Patient ID: Casey Jackson MRN: [id number] DOB/AGE: [birth year] 64 y.o.   Primary Cardiologist: Dr. SwazilandJordan  Admit date: 04/30/2015 Discharge date: 05/01/2015  Admission Diagnoses: Chest Pain  Discharge Diagnoses:  Active Problems:   ACS (acute coronary syndrome)   Chest pain   Discharged Condition: stable  Hospital Course: Casey Jackson is a 64 y.o. male who presented 04/30/15 for evaluation of chest pain. He is followed by Dr. SwazilandJordan. He has been seen i the ED several times in the last few weeks but has declined further evaluation. He has know hx of NSTEMI in 11/2014, ischemic CMP Ef 25-30%, HLD, continued tobacco abuse. Cath in Jan revealed 3v CAD and CABG was recommended, however patient refused at that time due to social issues. He therefore was taken back to Cath lab and underwent PCI of RCA with BMS. Patient also declined a lifevest. He has been on Aspirin and Effient. He has not tolerated BBs and recently had ACEi stopped due to hypotension. When he last saw Dr. Otho PerlJodan in clinic, he wanted to proceed with CABG evaluation due to continued symptoms. Appointment is scheduled with Dr. Lavinia SharpsBartel 05/07/15.  He presented back to the ED last night with 7/10, mid sternal chest pain which he describes as a dull ache with occasional spasms. He reports feeling generalized weakness/fatigued, otherwise denies any associated symptoms. He stated he took NTG at home with some relief. He called EMS for transport. He was given ASA and nitropaste in ED and CP resolved. He was admitted for observation. Cardiac enzymes were negative x 3. It was recommended that he stay for inpatient surgical consult, however the patient was adamant about being discharged and opted to wait until his outpatient consultation scheduled for 05/07/15. He understands the risks in continued delay of surgery including MI or death, and is working on his social situation to get to the point he is ready to have  surgery. He was last seen and examined by Dr. Wyline MoodBranch who released him for discharge. Patient denied any further CP. He was continue on ASA, Effient and Lipitor. He was instructed to keep his appointment with Dr. Lavinia SharpsBartel.    Consults: None  Significant Diagnostic Studies:  Cardiac Panel (last 3 results)  Recent Labs  04/29/15 2308 05/01/15 0040 05/01/15 0623  TROPONINI <0.03 <0.03 0.03    Treatments: See Hospital Course  Discharge Exam: Blood pressure 102/58, pulse 53, temperature 98.2 F (36.8 C), temperature source Oral, resp. rate 18, height 5\' 11"  (1.803 m), weight 183 lb 6.4 oz (83.19 kg), SpO2 96 %.   Disposition: 01-Home or Self Care      Discharge Instructions    Diet - low sodium heart healthy    Complete by:  As directed      Increase activity slowly    Complete by:  As directed             Medication List    STOP taking these medications        lisinopril 2.5 MG tablet  Commonly known as:  PRINIVIL,ZESTRIL      TAKE these medications        aspirin 81 MG tablet  Take 81 mg by mouth daily.     atorvastatin 40 MG tablet  Commonly known as:  LIPITOR  Take 1 tablet by mouth daily.     nitroGLYCERIN 0.4 MG SL tablet  Commonly known as:  NITROSTAT  Place 1 tablet (0.4 mg total) under the tongue every 5 (five) minutes  as needed for chest pain.     prasugrel 10 MG Tabs tablet  Commonly known as:  EFFIENT  Take 1 tablet (10 mg total) by mouth daily.       Follow-up Information    Follow up with Alleen Borne, MD On 05/07/2015.   Specialty:  Cardiothoracic Surgery   Why:  2:30 PM (Surgical Consult)   Contact information:   [address] [place] [address] [phone]       Follow up with Peter Swaziland, MD.   Specialty:  Cardiology   Why:  our office will call you with an appointment with Dr. Swaziland   Contact information:   [address] [place] [address] [phone]       TIME SPENT ON DISCHARGE,  INCLUDING PHYSICIAN TIME: >30 MINUTES  Signed: Robbie Lis 05/01/2015, 2:38 PM

## 2015-05-01 NOTE — Progress Notes (Signed)
Patient arrived from ED via stretcher. Patient alert, oriented and ambulatory with stand-by assist. Admission weight, vitals and assessment completed. Fall and safety plan reviewed with patient. Patient currently resting comfortably, pain free with call bell within reach. Will continue to monitor. Blood pressure 106/61, pulse 62, temperature 97.9 F (36.6 C), temperature source Oral, resp. rate 18, height 5\' 11"  (1.803 m), weight 83.19 kg (183 lb 6.4 oz), SpO2 100 %.  Troy SineWalker, Shaye Lagace M

## 2015-05-02 ENCOUNTER — Encounter (HOSPITAL_COMMUNITY): Payer: Self-pay | Admitting: Emergency Medicine

## 2015-05-02 ENCOUNTER — Emergency Department (HOSPITAL_COMMUNITY)
Admission: EM | Admit: 2015-05-02 | Discharge: 2015-05-02 | Disposition: A | Discharge status: Home / Self Care | Payer: Medicare Other | Attending: Emergency Medicine | Admitting: Emergency Medicine

## 2015-05-02 ENCOUNTER — Emergency Department (HOSPITAL_COMMUNITY): Payer: Medicare Other

## 2015-05-02 DIAGNOSIS — Y929 Unspecified place or not applicable: Secondary | ICD-10-CM | POA: Insufficient documentation

## 2015-05-02 DIAGNOSIS — Y939 Activity, unspecified: Secondary | ICD-10-CM | POA: Insufficient documentation

## 2015-05-02 DIAGNOSIS — H9190 Unspecified hearing loss, unspecified ear: Secondary | ICD-10-CM | POA: Insufficient documentation

## 2015-05-02 DIAGNOSIS — I2510 Atherosclerotic heart disease of native coronary artery without angina pectoris: Secondary | ICD-10-CM | POA: Diagnosis not present

## 2015-05-02 DIAGNOSIS — S29001A Unspecified injury of muscle and tendon of front wall of thorax, initial encounter: Secondary | ICD-10-CM | POA: Insufficient documentation

## 2015-05-02 DIAGNOSIS — M1990 Unspecified osteoarthritis, unspecified site: Secondary | ICD-10-CM | POA: Insufficient documentation

## 2015-05-02 DIAGNOSIS — I252 Old myocardial infarction: Secondary | ICD-10-CM | POA: Diagnosis not present

## 2015-05-02 DIAGNOSIS — Z720 Tobacco use: Secondary | ICD-10-CM | POA: Insufficient documentation

## 2015-05-02 DIAGNOSIS — W1839XA Other fall on same level, initial encounter: Secondary | ICD-10-CM | POA: Insufficient documentation

## 2015-05-02 DIAGNOSIS — Z880 Allergy status to penicillin: Secondary | ICD-10-CM | POA: Insufficient documentation

## 2015-05-02 DIAGNOSIS — R42 Dizziness and giddiness: Secondary | ICD-10-CM | POA: Diagnosis not present

## 2015-05-02 DIAGNOSIS — Y999 Unspecified external cause status: Secondary | ICD-10-CM | POA: Diagnosis not present

## 2015-05-02 DIAGNOSIS — R0781 Pleurodynia: Secondary | ICD-10-CM

## 2015-05-02 DIAGNOSIS — W19XXXA Unspecified fall, initial encounter: Secondary | ICD-10-CM

## 2015-05-02 DIAGNOSIS — E785 Hyperlipidemia, unspecified: Secondary | ICD-10-CM | POA: Diagnosis not present

## 2015-05-02 DIAGNOSIS — Z7982 Long term (current) use of aspirin: Secondary | ICD-10-CM | POA: Diagnosis not present

## 2015-05-02 DIAGNOSIS — Z79899 Other long term (current) drug therapy: Secondary | ICD-10-CM | POA: Diagnosis not present

## 2015-05-02 LAB — BASIC METABOLIC PANEL
Anion gap: 9 (ref 5–15)
BUN: 10 mg/dL (ref 6–20)
CALCIUM: 9.2 mg/dL (ref 8.9–10.3)
CO2: 25 mmol/L (ref 22–32)
Chloride: 101 mmol/L (ref 101–111)
Creatinine, Ser: 0.99 mg/dL (ref 0.61–1.24)
GFR calc Af Amer: >60 mL/min (ref >60)
GFR calc non Af Amer: >60 mL/min (ref >60)
Glucose, Bld: 102 mg/dL — ABNORMAL HIGH (ref 65–99)
Potassium: 3.8 mmol/L (ref 3.5–5.1)
Sodium: 135 mmol/L (ref 135–145)

## 2015-05-02 LAB — CBC
HCT: 40.9 % (ref 39.0–52.0)
Hemoglobin: 14.5 g/dL (ref 13.0–17.0)
MCH: 34 pg (ref 26.0–34.0)
MCHC: 35.5 g/dL (ref 30.0–36.0)
MCV: 95.8 fL (ref 78.0–100.0)
Platelets: 219 10*3/uL (ref 150–400)
RBC: 4.27 MIL/uL (ref 4.22–5.81)
RDW: 13.6 % (ref 11.5–15.5)
WBC: 6.7 10*3/uL (ref 4.0–10.5)

## 2015-05-02 LAB — I-STAT TROPONIN, ED: TROPONIN I, POC: 0.03 ng/mL (ref 0.00–0.08)

## 2015-05-02 MED ORDER — ACETAMINOPHEN 325 MG PO TABS
650.0000 mg | ORAL_TABLET | Freq: Once | ORAL | Status: AC
  Administered 2015-05-02: 650 mg via ORAL
  Filled 2015-05-02: qty 2

## 2015-05-02 MED ORDER — ACETAMINOPHEN 500 MG PO TABS: 500.0000 mg | ORAL_TABLET | Freq: Four times a day (QID) | ORAL | Status: DC | PRN

## 2015-05-02 NOTE — ED Provider Notes (Signed)
CSN: [id number]     Arrival date & time 05/02/15  1536 History   First MD Initiated Contact with Patient 05/02/15 1841     Chief Complaint  Patient presents with  . Fall  . Rib Injury  . Dizziness     (Consider location/radiation/quality/duration/timing/severity/associated sxs/prior Treatment) HPI Casey Jackson is a 64 y.o. male who comes in for evaluation of right-sided rib pain after a fall. Patient states he typically takes his blood pressure medicine in the morning which causes him to be dizzy for about 2 hours. States that he typically stays inside when he takes his medication, but today he was outside when he took his medication. He became dizzy and tripped over a trashcan in the driveway landing on his right ribs. He only reports moderate discomfort to his right rib with palpation and deep inspiration. He denies any dizziness now. Denies any headache, vision changes, chest pain, shortness of breath, numbness or weakness. States he has a follow-up appointment with his PCP tomorrow.  Past Medical History  Diagnosis Date  . Osteoarthritis   . Coronary artery disease     a. Big NSTEMI 11/2014: cath with surgical disease but patient initially refused CABG. Compromise decision was made to do intervention on the RCA with a bare metal stent with medical therapy for other disease and follow-up as an outpatient.  . Myocardial infarction     NSTEMI  . HOH (hard of hearing)   . Tobacco abuse   . Ischemic cardiomyopathy     a. a. Cath 12/22/14: EF 25%. b. 2D Echo 12/23/14: EF 25-30%, diffuse hypokinesis, akinesis of the entireinferolateral and inferior myocardium, mild MR.  [name] Hyperlipidemia   . Transaminitis    Past Surgical History  Procedure Laterality Date  . Left heart catheterization with coronary angiogram N/A 12/22/2014    Procedure: LEFT HEART CATHETERIZATION WITH CORONARY ANGIOGRAM;  Surgeon: Corky Crafts, MD; LAD 100%, CFX 90%, RI mild dz, RCA 95%/70%, EF 25%, CABG recommended   . Percutaneous coronary stent intervention (pci-s) N/A 12/23/2014    Procedure: PERCUTANEOUS CORONARY STENT INTERVENTION (PCI-S);  Surgeon: Corky Crafts, MD; 3.0 x 24 rebel BMS to the RCA    Family History  Problem Relation Age of Onset  . Hypertension    . Heart attack Neg Hx   . Stroke Neg Hx   . Cancer Father    History  Substance Use Topics  . Smoking status: Current Every Day Smoker -- 0.50 packs/day for 50 years    Types: Cigarettes  . Smokeless tobacco: Current User  . Alcohol Use: No    Review of Systems A 10 point review of systems was completed and was negative except for pertinent positives and negatives as mentioned in the history of present illness     Allergies  Penicillins  Home Medications   Prior to Admission medications   Medication Sig Start Date End Date Taking? Authorizing Provider  acetaminophen (TYLENOL) 500 MG tablet Take 1 tablet (500 mg total) by mouth every 6 (six) hours as needed. 05/02/15   Joycie Peek, PA-C  aspirin 81 MG tablet Take 81 mg by mouth daily.    Historical Provider, MD  atorvastatin (LIPITOR) 40 MG tablet Take 1 tablet by mouth daily. 03/21/15   Historical Provider, MD  nitroGLYCERIN (NITROSTAT) 0.4 MG SL tablet Place 1 tablet (0.4 mg total) under the tongue every 5 (five) minutes as needed for chest pain. 12/24/14   Rhonda G Barrett, PA-C  prasugrel (EFFIENT) 10 MG  TABS tablet Take 1 tablet (10 mg total) by mouth daily. 12/24/14   Rhonda G Barrett, PA-C   BP 118/64 mmHg  Pulse 81  Temp(Src) 97.8 F (36.6 C) (Oral)  Resp 19  SpO2 99% Physical Exam  Constitutional: He is oriented to person, place, and time. He appears well-developed and well-nourished.  HENT:  Head: Normocephalic and atraumatic.  Mouth/Throat: Oropharynx is clear and moist.  Eyes: Conjunctivae are normal. Pupils are equal, round, and reactive to light. Right eye exhibits no discharge. Left eye exhibits no discharge. No scleral icterus.  Neck: Neck  supple.  Cardiovascular: Normal rate, regular rhythm and normal heart sounds.   Pulmonary/Chest: Effort normal and breath sounds normal. No respiratory distress. He has no wheezes. He has no rales.  Mild tenderness to palpation on the right anterior chest, inferior most aspect of rib cage along anterior axillary line.  Abdominal: Soft. There is no tenderness.  Musculoskeletal: He exhibits no tenderness.  Neurological: He is alert and oriented to person, place, and time.  Cranial Nerves II-XII grossly intact  Skin: Skin is warm and dry. No rash noted.  Psychiatric: He has a normal mood and affect.  Nursing note and vitals reviewed.   ED Course  Procedures (including critical care time) Labs Review Labs Reviewed  BASIC METABOLIC PANEL - Abnormal; Notable for the following:    Glucose, Bld 102 (*)    All other components within normal limits  CBC  I-STAT TROPOININ, ED    Imaging Review Dg Chest 2 View  05/02/2015   CLINICAL DATA:  Right-sided rib pain secondary to a fall to dizziness.  EXAM: CHEST  2 VIEW  COMPARISON:  04/30/2015 and 04/29/2015  FINDINGS: The heart size and mediastinal contours are within normal limits. Both lungs are clear. The visualized skeletal structures are unremarkable.  IMPRESSION: No active cardiopulmonary disease.   Electronically Signed   By: Francene Boyers M.D.   On: 05/02/2015 16:41     EKG Interpretation   Date/Time:  Sunday May 02 2015 16:03:23 EDT Ventricular Rate:  76 PR Interval:  120 QRS Duration: 110 QT Interval:  404 QTC Calculation: 454 R Axis:   -54 Text Interpretation:  Normal sinus rhythm Left axis deviation Pulmonary  disease pattern Septal infarct , age undetermined ST \\T \ T wave  abnormality, consider inferior ischemia Abnormal ECG since last tracing no  significant change Confirmed by Effie Shy  MD, ELLIOTT [phone]) on 05/02/2015  7:37:38 PM     Meds given in ED:  Medications  acetaminophen (TYLENOL) tablet 650 mg (650 mg Oral Given  05/02/15 1951)    Discharge Medication List as of 05/02/2015  7:47 PM    START taking these medications   Details  acetaminophen (TYLENOL) 500 MG tablet Take 1 tablet (500 mg total) by mouth every 6 (six) hours as needed., Starting 05/02/2015, Until Discontinued, Print       Filed Vitals:   05/02/15 1800 05/02/15 1830 05/02/15 1900 05/02/15 1930  BP: 104/61 98/56 115/67 118/64  Pulse: 64 62 63 81  Temp:      TempSrc:      Resp: SpO2: 95% 98% 98% 99%    MDM  Vitals stable - WNL -afebrile Pt resting comfortably in ED. PE--physical exam is not concerning. Normal cardiopulmonary exam. Small superficial erythema to lower right ribs. Labwork--troponin negative, labs otherwise noncontributory. Imaging--chest x-ray shows no acute cardiopulmonary pathology  DDX--patient with fall after experiencing dizziness secondary to taking his blood pressure  medicine. This dizzy sensation after blood pressure medicine is a common occurrence for him. Doubt central lesion, SAH, ICH or other vascular compromise. No evidence of other acute or emergent pathology. Will DC with Tylenol for discomfort. Patient will be able to follow-up with his primary care tomorrow for further evaluation and management of symptoms. I discussed all relevant lab findings and imaging results with pt and they verbalized understanding. Discussed f/u with PCP within 48 hrs and return precautions, pt very amenable to plan. Prior to patient discharge, I discussed and reviewed this case with Dr. Effie ShyWentz, who also saw and evaluated the patient   Final diagnoses:  Rib pain  Fall, initial encounter        Joycie PeekBenjamin Nesiah Jump, PA-C 05/03/15 0130  Mancel BaleElliott Wentz, MD 05/03/15 1022

## 2015-05-02 NOTE — ED Provider Notes (Signed)
  Face-to-face evaluation   History: Patient had near syncopal episode causing him to fall, while working outside today. Denies headache or back pain.  Physical exam: Alert, calm, cooperative. Mild right anterior chest wall tenderness without crepitation or deformity. Abdomen is soft and nontender.  Medical screening examination/treatment/procedure(s) were conducted as a shared visit with non-physician practitioner(s) and myself.  I personally evaluated the patient during the encounter   Mancel BaleElliott Bambie Pizzolato, MD 05/03/15 1022

## 2015-05-02 NOTE — Discharge Instructions (Signed)
You were evaluated in the ED today for your chest pain after fall. You appeared to have a very mild bruise to your ribs. He may continue to take Tylenol for this discomfort. Please follow-up with primary care for your regularly scheduled appointment tomorrow. Return to ED for worsening symptoms.  Chest Wall Pain Chest wall pain is pain in or around the bones and muscles of your chest. It may take up to 6 weeks to get better. It may take longer if you must stay physically active in your work and activities.  CAUSES  Chest wall pain may happen on its own. However, it may be caused by:  A viral illness like the flu.  Injury.  Coughing.  Exercise.  Arthritis.  Fibromyalgia.  Shingles. HOME CARE INSTRUCTIONS   Avoid overtiring physical activity. Try not to strain or perform activities that cause pain. This includes any activities using your chest or your abdominal and side muscles, especially if heavy weights are used.  Put ice on the sore area.  Put ice in a plastic bag.  Place a towel between your skin and the bag.  Leave the ice on for 15-20 minutes per hour while awake for the first 2 days.  Only take over-the-counter or prescription medicines for pain, discomfort, or fever as directed by your caregiver. SEEK IMMEDIATE MEDICAL CARE IF:   Your pain increases, or you are very uncomfortable.  You have a fever.  Your chest pain becomes worse.  You have new, unexplained symptoms.  You have nausea or vomiting.  You feel sweaty or lightheaded.  You have a cough with phlegm (sputum), or you cough up blood. MAKE SURE YOU:   Understand these instructions.  Will watch your condition.  Will get help right away if you are not doing well or get worse. Document Released: 11/13/2005 Document Revised: 02/05/2012 Document Reviewed: 07/10/2011 [facility] Patient Information 2015 [employer] [place]. This information is not intended to replace advice given to you by your health care  provider. Make sure you discuss any questions you have with your health care provider.

## 2015-05-02 NOTE — ED Notes (Signed)
Pt reports having a fall this morning and now has rt rib pain that is worse with breathing and cough. Bilateral breath sounds present. Pt states that he took his bp medications and became dizzy prior to fall.

## 2015-05-03 ENCOUNTER — Ambulatory Visit: Payer: Medicare Other | Admitting: Cardiology

## 2015-05-03 ENCOUNTER — Telehealth: Payer: Self-pay | Admitting: Internal Medicine

## 2015-05-03 NOTE — Telephone Encounter (Signed)
This patient needs a hospital discharge phone call. 

## 2015-05-03 NOTE — Telephone Encounter (Signed)
[facility].  Patient understands to follow up with provider Dr. SwazilandJordan on 6/8 at 3:45p [facility]. Patient understands discharge instructions? Yes Patient understands medications and regiment? Yes Patient understands to bring all medications to this visit? Yes

## 2015-05-04 ENCOUNTER — Telehealth: Payer: Self-pay | Admitting: Cardiology

## 2015-05-04 NOTE — Telephone Encounter (Signed)
Pt. Called and informed me that his lt. Arm was numb and bruised ; symptoms started this morning . Pt. Instructed to either go to the ER to be evaluated or see his PCP   Or see  Dr. SwazilandJordan tomorrow at his scheduled visit, pt. Agreed with plan

## 2015-05-05 ENCOUNTER — Ambulatory Visit: Payer: Medicare Other | Admitting: Cardiology

## 2015-05-07 ENCOUNTER — Ambulatory Visit (INDEPENDENT_AMBULATORY_CARE_PROVIDER_SITE_OTHER): Payer: Medicare Other | Admitting: Surgery

## 2015-05-07 ENCOUNTER — Encounter: Payer: Self-pay | Admitting: Surgery

## 2015-05-07 VITALS — BP 135/77 | HR 94 | Resp 20 | Ht 71.0 in | Wt 183.0 lb

## 2015-05-07 DIAGNOSIS — I2510 Atherosclerotic heart disease of native coronary artery without angina pectoris: Secondary | ICD-10-CM

## 2015-05-07 DIAGNOSIS — I200 Unstable angina: Secondary | ICD-10-CM

## 2015-05-10 ENCOUNTER — Encounter: Payer: Self-pay | Admitting: Surgery

## 2015-05-10 ENCOUNTER — Other Ambulatory Visit: Payer: Self-pay | Admitting: *Deleted

## 2015-05-10 DIAGNOSIS — I2510 Atherosclerotic heart disease of native coronary artery without angina pectoris: Secondary | ICD-10-CM

## 2015-05-10 DIAGNOSIS — I509 Heart failure, unspecified: Secondary | ICD-10-CM

## 2015-05-10 NOTE — Progress Notes (Signed)
      HPI:  Mr. Enterline returns today to discuss CABG surgery after undergoing PCI of the RCA with a BMS on 12/23/2014. He was sent home on Eliquis and the plan was to do CABG after one month due to the presence of severe 3 vessel CAD with severe LV dysfunction. He did not return in one month due to transportation problems but returned on 03/03/2015. He felt well at that time and had been watching his diet closely and walking daily and had lost 20 lbs. He denied any symptoms and did not want to proceed with CABG at that time. He was admitted on 04/30/2015 with chest pain and ruled out for MI. He did not want to stay in the hospital for reevaluation by me and surgery and was sent home the following day. He presented to the ER again on 05/02/2015 with a near syncopal episode and a fall bruising his right chest wall.   He does report chest discomfort that he calls "spasms" that only occur when he lays down. He denies any shortness of breath or exertional symptoms. He continues to walk and has lost 50 lbs since January by watching his diet and walking.  Current Outpatient Prescriptions  Medication Sig Dispense Refill  . aspirin 81 MG tablet Take 81 mg by mouth daily.    [name] atorvastatin (LIPITOR) 40 MG tablet Take 1 tablet by mouth daily.  11  . lisinopril (PRINIVIL,ZESTRIL) 2.5 MG tablet TK 1 T PO D  11  . nitroGLYCERIN (NITROSTAT) 0.4 MG SL tablet Place 1 tablet (0.4 mg total) under the tongue every 5 (five) minutes as needed for chest pain. 25 tablet 3  . prasugrel (EFFIENT) 10 MG TABS tablet Take 1 tablet (10 mg total) by mouth daily. 30 tablet 11   No current facility-administered medications for this visit.     Physical Exam: BP 135/77 mmHg  Pulse 94  Resp 20  Ht 5\' 11"  (1.803 m)  Wt 183 lb (83.008 kg)  BMI 25.53 kg/m2  SpO2 97% He looks well Cardiac exam shows a regular rate and rhythm with normal heart sounds Lung exam is clear There is no peripheral edema  Diagnostic  Tests:  none  Impression:  He has severe 3 vessel coronary disease s/p PCI of the RCA in Jan 2016. I agree with the need for CABG for complete revascularization. I suspect that he is symptomatic but he is eccentric and it is difficult to get a straight story from him. I discussed the operative procedure with the patient including alternatives, benefits and risks; including but not limited to bleeding, blood transfusion, infection, stroke, myocardial infarction, graft failure, heart block requiring a permanent pacemaker, organ dysfunction, and death.  Casey Jackson understands and agrees to proceed.     Plan:  The patient will call back to schedule CABG after he thinks about the timing further. He understands the risk of delaying surgical revascularization including possible MI and death.   Alleen Borne, MD [facility] [phone]

## 2015-05-11 ENCOUNTER — Telehealth: Payer: Self-pay | Admitting: Cardiology

## 2015-05-11 NOTE — Telephone Encounter (Signed)
Returned call to patient monitor results given.Patient stated something has to be done.Stated he had to restart lisinopril 2.5 mg daily.Stated if he does not take lisinopril he has " heart spasms".Stated 30 mins after he takes lisinopril 2.5 mg he has chest pain that last appox 2 hours.Stated he rather have chest pain than heart spasms.Stated he would like Dr.Jordan to prescribe a different medication.Stated he has planned a trip to [facility] 05/29/15 to go to a night race.CABG scheduled 06/14/15.Patient advised needs to stay out of hot weather and avoid strenuous activity.   Advised Dr.Jordan out of office this afternoon will send message to him for advice.

## 2015-05-11 NOTE — Telephone Encounter (Signed)
Returning your call from today. °

## 2015-05-11 NOTE — Telephone Encounter (Signed)
My recommendation was to hold lisinopril for now. No further recommendations until he completes revascularization with CABG.  Peter Swaziland MD, [facility]

## 2015-05-12 ENCOUNTER — Ambulatory Visit (HOSPITAL_COMMUNITY)
Admission: RE | Admit: 2015-05-12 | Discharge: 2015-05-12 | Disposition: A | Discharge status: Home / Self Care | Payer: Medicare Other | Source: Ambulatory Visit | Attending: Surgery | Admitting: Surgery

## 2015-05-12 DIAGNOSIS — I252 Old myocardial infarction: Secondary | ICD-10-CM | POA: Diagnosis not present

## 2015-05-12 DIAGNOSIS — I509 Heart failure, unspecified: Secondary | ICD-10-CM | POA: Insufficient documentation

## 2015-05-12 DIAGNOSIS — I255 Ischemic cardiomyopathy: Secondary | ICD-10-CM | POA: Diagnosis not present

## 2015-05-12 DIAGNOSIS — I2510 Atherosclerotic heart disease of native coronary artery without angina pectoris: Secondary | ICD-10-CM | POA: Diagnosis not present

## 2015-05-12 DIAGNOSIS — E785 Hyperlipidemia, unspecified: Secondary | ICD-10-CM | POA: Diagnosis not present

## 2015-05-12 NOTE — Progress Notes (Signed)
  Echocardiogram 2D Echocardiogram has been performed.  Casey Jackson 05/12/2015, 10:02 AM

## 2015-05-12 NOTE — Telephone Encounter (Signed)
Left message on patient's voice mail Please hold taking lisinopril for now.  Please call tomorrow 05/12/15 to confirm you received the message.

## 2015-05-14 ENCOUNTER — Telehealth: Payer: Self-pay | Admitting: Cardiology

## 2015-05-14 NOTE — Telephone Encounter (Signed)
Pt says when he stop taking his Lisinopril,he start havinh heart spasm. What should he do?

## 2015-05-14 NOTE — Telephone Encounter (Signed)
Returned call to patient he stated he wanted Dr.Jordan to prescribe another medication to replace Lisinopril.Stated if he takes lisinopril it causes chest pain and he is unable to eat foods with potassium.Stated if he does not take lisinopril he has heart spasm.Spoke to Dr.Jordan he advised to stop taking lisinopril.Advised to have heart surgery as planned.

## 2015-05-18 ENCOUNTER — Telehealth: Payer: Self-pay | Admitting: Cardiology

## 2015-05-18 ENCOUNTER — Telehealth: Payer: Self-pay

## 2015-05-18 NOTE — Telephone Encounter (Signed)
Returned call to patient no answer.LMTC. 

## 2015-05-18 NOTE — Telephone Encounter (Signed)
New Message       Pt calling wanting to know if Dr. Swaziland can put him on a different cholesterol medication. Please call back and advise.

## 2015-05-21 ENCOUNTER — Emergency Department (HOSPITAL_COMMUNITY)
Admission: EM | Admit: 2015-05-21 | Discharge: 2015-05-21 | Disposition: A | Discharge status: Home / Self Care | Payer: Medicare Other | Attending: Emergency Medicine | Admitting: Emergency Medicine

## 2015-05-21 ENCOUNTER — Emergency Department (HOSPITAL_COMMUNITY): Payer: Medicare Other

## 2015-05-21 ENCOUNTER — Encounter (HOSPITAL_COMMUNITY): Payer: Self-pay | Admitting: *Deleted

## 2015-05-21 DIAGNOSIS — Z720 Tobacco use: Secondary | ICD-10-CM | POA: Diagnosis not present

## 2015-05-21 DIAGNOSIS — I252 Old myocardial infarction: Secondary | ICD-10-CM | POA: Diagnosis not present

## 2015-05-21 DIAGNOSIS — Z880 Allergy status to penicillin: Secondary | ICD-10-CM | POA: Diagnosis not present

## 2015-05-21 DIAGNOSIS — Z7982 Long term (current) use of aspirin: Secondary | ICD-10-CM | POA: Diagnosis not present

## 2015-05-21 DIAGNOSIS — Z79899 Other long term (current) drug therapy: Secondary | ICD-10-CM | POA: Insufficient documentation

## 2015-05-21 DIAGNOSIS — E785 Hyperlipidemia, unspecified: Secondary | ICD-10-CM | POA: Diagnosis not present

## 2015-05-21 DIAGNOSIS — I2510 Atherosclerotic heart disease of native coronary artery without angina pectoris: Secondary | ICD-10-CM | POA: Diagnosis not present

## 2015-05-21 DIAGNOSIS — Z9861 Coronary angioplasty status: Secondary | ICD-10-CM | POA: Insufficient documentation

## 2015-05-21 DIAGNOSIS — M1990 Unspecified osteoarthritis, unspecified site: Secondary | ICD-10-CM | POA: Insufficient documentation

## 2015-05-21 DIAGNOSIS — R079 Chest pain, unspecified: Secondary | ICD-10-CM

## 2015-05-21 LAB — CBC
HEMATOCRIT: 42.9 % (ref 39.0–52.0)
Hemoglobin: 15.1 g/dL (ref 13.0–17.0)
MCH: 34.5 pg — ABNORMAL HIGH (ref 26.0–34.0)
MCHC: 35.2 g/dL (ref 30.0–36.0)
MCV: 97.9 fL (ref 78.0–100.0)
Platelets: 220 10*3/uL (ref 150–400)
RBC: 4.38 MIL/uL (ref 4.22–5.81)
RDW: 14.3 % (ref 11.5–15.5)
WBC: 7.2 10*3/uL (ref 4.0–10.5)

## 2015-05-21 LAB — BASIC METABOLIC PANEL
Anion gap: 9 (ref 5–15)
BUN: 7 mg/dL (ref 6–20)
CHLORIDE: 101 mmol/L (ref 101–111)
CO2: 27 mmol/L (ref 22–32)
Calcium: 9.1 mg/dL (ref 8.9–10.3)
Creatinine, Ser: 1.22 mg/dL (ref 0.61–1.24)
Glucose, Bld: 97 mg/dL (ref 65–99)
POTASSIUM: 4.2 mmol/L (ref 3.5–5.1)
Sodium: 137 mmol/L (ref 135–145)

## 2015-05-21 LAB — I-STAT TROPONIN, ED
TROPONIN I, POC: 0.02 ng/mL (ref 0.00–0.08)
Troponin i, poc: 0.02 ng/mL (ref 0.00–0.08)

## 2015-05-21 LAB — BRAIN NATRIURETIC PEPTIDE: B Natriuretic Peptide: 58.3 pg/mL (ref 0.0–100.0)

## 2015-05-21 NOTE — ED Notes (Signed)
Pt reports recurrent stabbing central chest pain for the past two hours. Pt reports similar symptoms occuring last night associated with shortness of breath, lightheadedness, and dizziness. Pt states he has a bypass schedule July 18th.

## 2015-05-21 NOTE — ED Provider Notes (Signed)
CSN: [id number]     Arrival date & time 05/21/15  1527 History   First MD Initiated Contact with Patient 05/21/15 1710     Chief Complaint  Patient presents with  . Chest Pain     (Consider location/radiation/quality/duration/timing/severity/associated sxs/prior Treatment) HPI   Pt with hx CAD, s/p NSTEMI w 3 vessel disease, stent placed in January, HLD, tobacco abuse p/w chest pain, lightheadedness, SOB.   Pt has upcoming CABG scheduled 06/14/15, delayed several times due to patient's social issues.  Pt c/o palpitations and SOB that began last night.  This morning he developed central chest pain, described as "lightness" with associated lightheadedness, SOB, occurred while walking around, gradually worsened at home and lasted approximately 3 hours.  Worse with exertion, no improvement with nitroglycerin.  Still having abnormal sensation of palpitations in his upper chest.  Has recently stopped both his lisinopril and his atorvastatin due to side effects (lightheadedness, muscle cramps respectively). Denies fever, cough, URI symptoms, leg swelling.    Past Medical History  Diagnosis Date  . Osteoarthritis   . Coronary artery disease     a. Big NSTEMI 11/2014: cath with surgical disease but patient initially refused CABG. Compromise decision was made to do intervention on the RCA with a bare metal stent with medical therapy for other disease and follow-up as an outpatient.  . Myocardial infarction     NSTEMI  . HOH (hard of hearing)   . Tobacco abuse   . Ischemic cardiomyopathy     a. a. Cath 12/22/14: EF 25%. b. 2D Echo 12/23/14: EF 25-30%, diffuse hypokinesis, akinesis of the entireinferolateral and inferior myocardium, mild MR.  [name] Hyperlipidemia   . Transaminitis    Past Surgical History  Procedure Laterality Date  . Left heart catheterization with coronary angiogram N/A 12/22/2014    Procedure: LEFT HEART CATHETERIZATION WITH CORONARY ANGIOGRAM;  Surgeon: Corky Crafts, MD; LAD  100%, CFX 90%, RI mild dz, RCA 95%/70%, EF 25%, CABG recommended  . Percutaneous coronary stent intervention (pci-s) N/A 12/23/2014    Procedure: PERCUTANEOUS CORONARY STENT INTERVENTION (PCI-S);  Surgeon: Corky Crafts, MD; 3.0 x 24 rebel BMS to the RCA    Family History  Problem Relation Age of Onset  . Hypertension    . Heart attack Neg Hx   . Stroke Neg Hx   . Cancer Father    History  Substance Use Topics  . Smoking status: Current Every Day Smoker -- 0.50 packs/day for 50 years    Types: Cigarettes  . Smokeless tobacco: Current User  . Alcohol Use: No    Review of Systems  All other systems reviewed and are negative.     Allergies  Penicillins and Atorvastatin  Home Medications   Prior to Admission medications   Medication Sig Start Date End Date Taking? Authorizing Provider  aspirin 81 MG tablet Take 81 mg by mouth daily.   Yes Historical Provider, MD  docusate sodium (COLACE) 100 MG capsule Take 100 mg by mouth daily as needed for mild constipation.   Yes Historical Provider, MD  nitroGLYCERIN (NITROSTAT) 0.4 MG SL tablet Place 1 tablet (0.4 mg total) under the tongue every 5 (five) minutes as needed for chest pain. 12/24/14  Yes Rhonda G Barrett, PA-C  prasugrel (EFFIENT) 10 MG TABS tablet Take 1 tablet (10 mg total) by mouth daily. 12/24/14  Yes Rhonda G Barrett, PA-C   BP 107/57 mmHg  Pulse 66  Temp(Src) 98 F (36.7 C) (Oral)  Resp 19  Ht 5\' 11"  (1.803 m)  Wt 186 lb (84.369 kg)  BMI 25.95 kg/m2  SpO2 97% Physical Exam  Constitutional: He appears well-developed and well-nourished. No distress.  HENT:  Head: Normocephalic and atraumatic.  Neck: Neck supple.  Cardiovascular: Normal rate and regular rhythm.   Pulmonary/Chest: Effort normal and breath sounds normal. No respiratory distress. He has no wheezes. He has no rales.  Abdominal: Soft. He exhibits no distension and no mass. There is no tenderness. There is no rebound and no guarding.   Musculoskeletal: He exhibits no edema.  Neurological: He is alert. He exhibits normal muscle tone.  Skin: He is not diaphoretic.  Nursing note and vitals reviewed.   ED Course  Procedures (including critical care time) Labs Review Labs Reviewed  CBC - Abnormal; Notable for the following:    MCH 34.5 (*)    All other components within normal limits  BASIC METABOLIC PANEL  BRAIN NATRIURETIC PEPTIDE  I-STAT TROPOININ, ED  Rosezena Sensor, ED    Imaging Review Dg Chest 2 View  05/21/2015   CLINICAL DATA:  Chest pain and shortness of Breath  EXAM: CHEST - 2 VIEW  COMPARISON:  05/02/2015  FINDINGS: The heart size and mediastinal contours are within normal limits. Both lungs are clear. The visualized skeletal structures are unremarkable.  IMPRESSION: No active disease.   Electronically Signed   By: Alcide Clever M.D.   On: 05/21/2015 16:09     EKG Interpretation   Date/Time:  Friday May 21 2015 15:31:11 EDT Ventricular Rate:  82 PR Interval:  120 QRS Duration: 112 QT Interval:  402 QTC Calculation: 469 R Axis:   -46 Text Interpretation:  Normal sinus rhythm Left axis deviation Pulmonary  disease pattern Incomplete right bundle branch block Septal infarct , age  undetermined Abnormal ECG has not changed Confirmed by Rhunette Croft, MD, ANKIT  [phone]) on 05/21/2015 5:48:21 PM      EKG Interpretation  Date/Time:  Friday May 21 2015 18:43:16 EDT Ventricular Rate:  63 PR Interval:  129 QRS Duration: 121 QT Interval:  442 QTC Calculation: 452 R Axis:   -64 Text Interpretation:  Sinus rhythm Nonspecific IVCD with LAD Borderline T abnormalities, diffuse leads persistent and unchanged mild ST elevation in leads v1, v2 and ST depression in lateral leads Confirmed by Rhunette Croft, MD, Janey Genta [phone]) on 05/21/2015 7:28:45 PM        5:51 PM Discussed pt with Dr Rhunette Croft.    6:47 PM Discussed pt with Dr Patty Sermons who will come to ED to see the patient.    8:30 PM Discussed pt with Dr  Patty Sermons who has seen the patient.  Pt to have repeat troponin at 9pm.  If negative, he may be d/c home with close cardiology follow up.    MDM   Final diagnoses:  Chest pain, unspecified chest pain type    Afebrile, nontoxic patient with 3v CAD and upcoming CABG, recently off lisinopril and statin c/o chest pain, SOB, lightheadedness.  Pt seen by Cardiology, please see their note for further details.  Per their recommendations, repeat troponin ordered and was negative, pt cleared for d/c home with close cardiology follow up.  D/C home.  Discussed result, findings, treatment, and follow up  with patient.  Pt given return precautions.  Pt verbalizes understanding and agrees with plan.         [place], PA-C 05/22/15 0045  Derwood Kaplan, MD 05/22/15 0140

## 2015-05-21 NOTE — Consult Note (Signed)
CARDIOLOGY CONSULT NOTE   Patient ID: Casey Jackson MRN: [id number], DOB/AGE: 05/30/1951   Admit date: 05/21/2015 Date of Consult: 05/21/2015   Primary Physician: Dorrene German, MD Primary Cardiologist: Dr. Patty Sermons  Pt. Profile  64 year old Caucasian male with past medical history of CAD s/p NSTEMI with three-vessel disease, BMS placement to RCA in January 2016, hyperlipidemia, tobacco abuse present with burning chest pain after lunch  Problem List  Past Medical History  Diagnosis Date  . Osteoarthritis   . Coronary artery disease     a. Big NSTEMI 11/2014: cath with surgical disease but patient initially refused CABG. Compromise decision was made to do intervention on the RCA with a bare metal stent with medical therapy for other disease and follow-up as an outpatient.  . Myocardial infarction     NSTEMI  . HOH (hard of hearing)   . Tobacco abuse   . Ischemic cardiomyopathy     a. a. Cath 12/22/14: EF 25%. b. 2D Echo 12/23/14: EF 25-30%, diffuse hypokinesis, akinesis of the entireinferolateral and inferior myocardium, mild MR.  [name] Hyperlipidemia   . Transaminitis     Past Surgical History  Procedure Laterality Date  . Left heart catheterization with coronary angiogram N/A 12/22/2014    Procedure: LEFT HEART CATHETERIZATION WITH CORONARY ANGIOGRAM;  Surgeon: Corky Crafts, MD; LAD 100%, CFX 90%, RI mild dz, RCA 95%/70%, EF 25%, CABG recommended  . Percutaneous coronary stent intervention (pci-s) N/A 12/23/2014    Procedure: PERCUTANEOUS CORONARY STENT INTERVENTION (PCI-S);  Surgeon: Corky Crafts, MD; 3.0 x 24 rebel BMS to the RCA      Allergies  Allergies  Allergen Reactions  . Penicillins Swelling  . Atorvastatin     Muscle problems     HPI   The patient is a 64 year old Caucasian male with past medical history of CAD s/p NSTEMI with three-vessel disease, BMS placement to RCA in January 2016, hyperlipidemia, tobacco abuse. He initially came to [facility] and was seen by cardiology group in January 2016. Cardiac catheterization performed on 12/22/2014 showed EF 25%, occluded LAD with left to left collateral, severe disease in mid LCx and large OM 2, severe disease in mid and distal RCA. CT surgery was consulted given his social situation and likely inability to be compliant with DAPT for prolonged period of time. He was seen by Dr. Laneta Simmers, although he initially agreed to undergo bypass surgery, however later he decided he wanted to delay surgery until 01/03/2015 to take care of personal business. Patient was extremely adamant at that time to leave even if he has to leave AMA. It was noted patient had at least 3 hours of chest pain the night prior despite continuous IV heparin and IV nitroglycerin. EKG at the time showed ST depression. Multiple parties including cardiology PA, case manager, cardiac nurse, and Dr. Shirlee Latch attempted to convince the patient to reconsider given extreme high likelihood of cardiac arrest if he leaves at the time, however he insisted on leaving AMA. Eventually after talking with Dr. Eldridge Dace and discussing the extreme likelihood of out of the hospital cardiac arrest without any intervention, patient agreed to stay one more day to undergo PCI of RCA with bare-metal stent which was done on 12/23/2014 in an effort to safely delay CABG to a later date. He had successful PCI of 95% mid RCA stenosis with bare-metal stent (3.0 x 24 rebel, postdilated to greater than 3.5 mm). He was placed on aspirin and Effient. He had an echocardiogram at  the time which showed EF 25-30%. He was discharged on the following day. His last echocardiogram obtained on 6/15 showed EF 20-25% diffuse hypokinesis and grade 2 diastolic dysfunction with multiple wall motion abnormalities.  He has been tried on several beta blocker including Bystolic, carvedilol and Toprol, however discontinued due to symptom of chest pain, dizziness and feeling "drunk". His Lipitor was  also discontinued due to muscle spasm which has not recurred since his discontinuation of the medication which further convinced him that Lipitor was causing the problem. He has cut back on smoking to only one cigarette per day from previously one half pack per day in January. He has not had any significant amount of alcohol since January. He has lost weight since January as well. His bypass surgery was later delayed again due to transportation issues, and is currently scheduled for 06/14/2015.  He has been chest pain-free for at least several weeks and has been doing good. He is waiting for his bypass surgery to occur on 7/18. Around noon on 05/21/2015, patient ate a bowl of pinto beans and some corn, half hour later, he had a burning sensation in substernal space which occurred around 1 PM. The chest pain did not go away until 3 PM shortly before he arrived at the ED. He denies any significant shortness breath, recent lower extremity edema, orthopnea or paroxysmal nocturnal dyspnea. Initial EKG showed no changes when compared to the previous EKG. Initial troponin is negative. Per patient, the chest pain he had today feels different from his original MI in Jan 2016. He has attributed to food and too much potassium. Cardiology has been consulted for chest pain   No current facility-administered medications on file prior to encounter.   Current Outpatient Prescriptions on File Prior to Encounter  Medication Sig Dispense Refill  . aspirin 81 MG tablet Take 81 mg by mouth daily.    . nitroGLYCERIN (NITROSTAT) 0.4 MG SL tablet Place 1 tablet (0.4 mg total) under the tongue every 5 (five) minutes as needed for chest pain. 25 tablet 3  . prasugrel (EFFIENT) 10 MG TABS tablet Take 1 tablet (10 mg total) by mouth daily. 30 tablet 11      Family History Family History  Problem Relation Age of Onset  . Hypertension    . Heart attack Neg Hx   . Stroke Neg Hx   . Cancer Father      Social  History History   Social History  . Marital Status: Single    Spouse Name: N/A  . Number of Children: 0  . Years of Education: N/A   Occupational History  . disabled     former [employer] investment banker   Social History Main Topics  . Smoking status: Current Every Day Smoker -- 0.50 packs/day for 50 years    Types: Cigarettes  . Smokeless tobacco: Current User  . Alcohol Use: No  . Drug Use: No  . Sexual Activity: Not on file   Other Topics Concern  . Not on file   Social History Narrative     Review of Systems  General:  No chills, fever, night sweats or weight changes.  Cardiovascular:  No dyspnea on exertion, edema, orthopnea, palpitations, paroxysmal nocturnal dyspnea. +chest pain Dermatological: No rash, lesions/masses Respiratory: No cough, dyspnea Urologic: No hematuria, dysuria Abdominal:   No nausea, vomiting, diarrhea, bright red blood per rectum, melena, or hematemesis Neurologic:  No visual changes, wkns, changes in mental status. All other systems reviewed and are otherwise  negative except as noted above.  Physical Exam  Blood pressure 162/90, pulse 76, temperature 98 F (36.7 C), temperature source Oral, resp. rate 21, height 5\' 11"  (1.803 m), weight 186 lb (84.369 kg), SpO2 96 %.  General: Pleasant, NAD Psych: Normal affect. Neuro: Alert and oriented X 3. Moves all extremities spontaneously. HEENT: Normal  Neck: Supple without bruits or JVD. Lungs:  Resp regular and unlabored, CTA. Heart: RRR no s3, s4, or murmurs. Abdomen: Soft, non-tender, non-distended, BS + x 4.  Extremities: No clubbing, cyanosis or edema. DP/PT/Radials 2+ and equal bilaterally.  Labs  No results for input(s): CKTOTAL, CKMB, TROPONINI in the last 72 hours. Lab Results  Component Value Date   WBC 7.2 05/21/2015   HGB 15.1 05/21/2015   HCT 42.9 05/21/2015   MCV 97.9 05/21/2015   PLT 220 05/21/2015    Recent Labs Lab 05/21/15 1632  NA 137  K 4.2  CL 101  CO2 27  BUN 7   CREATININE 1.22  CALCIUM 9.1  GLUCOSE 97   Lab Results  Component Value Date   CHOL 122 01/26/2015   HDL 31.50* 01/26/2015   LDLCALC 64 01/26/2015   TRIG 132.0 01/26/2015   Lab Results  Component Value Date   DDIMER 0.33 12/22/2014    Radiology/Studies  Dg Chest 2 View  05/21/2015   CLINICAL DATA:  Chest pain and shortness of Breath  EXAM: CHEST - 2 VIEW  COMPARISON:  05/02/2015  FINDINGS: The heart size and mediastinal contours are within normal limits. Both lungs are clear. The visualized skeletal structures are unremarkable.  IMPRESSION: No active disease.   Electronically Signed   By: Alcide Clever M.D.   On: 05/21/2015 16:09   Dg Chest 2 View  05/02/2015   CLINICAL DATA:  Right-sided rib pain secondary to a fall to dizziness.  EXAM: CHEST  2 VIEW  COMPARISON:  04/30/2015 and 04/29/2015  FINDINGS: The heart size and mediastinal contours are within normal limits. Both lungs are clear. The visualized skeletal structures are unremarkable.  IMPRESSION: No active cardiopulmonary disease.   Electronically Signed   By: Francene Boyers M.D.   On: 05/02/2015 16:41   Dg Chest 2 View  04/30/2015   CLINICAL DATA:  Acute onset of generalized chest pain. Initial encounter.  EXAM: CHEST  2 VIEW  COMPARISON:  Chest radiograph performed 04/13/2015  FINDINGS: The lungs are well-aerated. Mild peribronchial thickening is noted. A 1.2 cm nodule is noted at the left lung base. There is no evidence of pleural effusion or pneumothorax.  The heart is normal in size; the mediastinal contour is within normal limits. No acute osseous abnormalities are seen. Degenerative change is noted at the acromioclavicular joints bilaterally.  IMPRESSION: 1. Mild peribronchial thickening noted; lungs otherwise clear. 2. 1.2 cm nodular density at the left lung base. As previously suggested, repeat chest radiograph with nipple markers would be helpful for further evaluation.   Electronically Signed   By: Roanna Raider M.D.   On:  04/30/2015 00:34   Dg Chest Portable 1 View  04/30/2015   CLINICAL DATA:  Chest pain and shortness of breath. Previous myocardial infarction. Hypertension.  EXAM: PORTABLE CHEST - 1 VIEW  COMPARISON:  04/29/2015  FINDINGS: The heart size and mediastinal contours are within normal limits. Both lungs are clear. The visualized skeletal structures are unremarkable.  IMPRESSION: No active disease.   Electronically Signed   By: Myles Rosenthal M.D.   On: 04/30/2015 21:02    ECG  Normal sinus rhythm, left axis deviation, incomplete right bundle branch block, poor R wave progression in the anterior lead, TWI in inferior lead, minimal ST depression in lateral leads. Both ST and T wave changes, although abnormal, has been persistent since prior EKG  ASSESSMENT AND PLAN  1. Atypical chest pain  - no EKG changes, negative trop  - Although symptoms does sounds GI in nature, however patient has known severe coronary disease that's untreated  - I have discussed with the patient multiple options including admission and trend troponin overnight versus obtain 2nd trop and if negative discharge from ED. patient tells me admission overnight for observation is not an option as he has too many things to do at home   - Since patient does not wish to stay, we will obtain a second troponin, if negative, he can go home. Given his severe coronary artery disease, there will always be a small chance of heart attack prior to his CABG, I have discussed with him sign and symptom of MI, and patient understand that if his chest pain recur, he will seek medical attention at ED again  2. CAD s/p NSTEMI with three-vessel disease  - s/p BMS placement to RCA in January 2016 as pt did not wish to stay for CABG due to social issues  3. Hyperlipidemia 4. Tobacco abuse: has successfully cut back 5. Hypotension/dizziness: did not tolerate ACEI, BB. 6. Intolerant to statin due to muscle cramps    Signed, Azalee Course, PA-C 05/21/2015, 8:17  PM Patient was seen in ER with Mr. Lisabeth Devoid. I agree with his assessment and plan.  The patient is very determined to go home tonight. If his next troponin is normal it is reasonable to discharge him from the ER, with strict admonition to return if chest pains recur. His EKGs were reviewed and show no significant change from prior EKG on June 5. Currently painfree.

## 2015-05-21 NOTE — Discharge Instructions (Signed)
Read the information below.  You may return to the Emergency Department at any time for worsening condition or any new symptoms that concern you.If you develop worsening chest pain, shortness of breath, fever, you pass out, or become weak or dizzy, return to the ER for a recheck.    ° ° °Chest Pain (Nonspecific) °It is often hard to give a specific diagnosis for the cause of chest pain. There is always a chance that your pain could be related to something serious, such as a heart attack or a blood clot in the lungs. You need to follow up with your health care provider for further evaluation. °CAUSES  °· Heartburn. °· Pneumonia or bronchitis. °· Anxiety or stress. °· Inflammation around your heart (pericarditis) or lung (pleuritis or pleurisy). °· A blood clot in the lung. °· A collapsed lung (pneumothorax). It can develop suddenly on its own (spontaneous pneumothorax) or from trauma to the chest. °· Shingles infection (herpes zoster virus). °The chest wall is composed of bones, muscles, and cartilage. Any of these can be the source of the pain. °· The bones can be bruised by injury. °· The muscles or cartilage can be strained by coughing or overwork. °· The cartilage can be affected by inflammation and become sore (costochondritis). °DIAGNOSIS  °Lab tests or other studies may be needed to find the cause of your pain. Your health care provider may have you take a test called an ambulatory electrocardiogram (ECG). An ECG records your heartbeat patterns over a 24-hour period. You may also have other tests, such as: °· Transthoracic echocardiogram (TTE). During echocardiography, sound waves are used to evaluate how blood flows through your heart. °· Transesophageal echocardiogram (TEE). °· Cardiac monitoring. This allows your health care provider to monitor your heart rate and rhythm in real time. °· Holter monitor. This is a portable device that records your heartbeat and can help diagnose heart arrhythmias. It allows  your health care provider to track your heart activity for several days, if needed. °· Stress tests by exercise or by giving medicine that makes the heart beat faster. °TREATMENT  °· Treatment depends on what may be causing your chest pain. Treatment may include: °¨ Acid blockers for heartburn. °¨ Anti-inflammatory medicine. °¨ Pain medicine for inflammatory conditions. °¨ Antibiotics if an infection is present. °· You may be advised to change lifestyle habits. This includes stopping smoking and avoiding alcohol, caffeine, and chocolate. °· You may be advised to keep your head raised (elevated) when sleeping. This reduces the chance of acid going backward from your stomach into your esophagus. °Most of the time, nonspecific chest pain will improve within 2-3 days with rest and mild pain medicine.  °HOME CARE INSTRUCTIONS  °· If antibiotics were prescribed, take them as directed. Finish them even if you start to feel better. °· For the next few days, avoid physical activities that bring on chest pain. Continue physical activities as directed. °· Do not use any tobacco products, including cigarettes, chewing tobacco, or electronic cigarettes. °· Avoid drinking alcohol. °· Only take medicine as directed by your health care provider. °· Follow your health care provider's suggestions for further testing if your chest pain does not go away. °· Keep any follow-up appointments you made. If you do not go to an appointment, you could develop lasting (chronic) problems with pain. If there is any problem keeping an appointment, call to reschedule. °SEEK MEDICAL CARE IF:  °· Your chest pain does not go away, even after treatment. °·   You have a rash with blisters on your chest. °· You have a fever. °SEEK IMMEDIATE MEDICAL CARE IF:  °· You have increased chest pain or pain that spreads to your arm, neck, jaw, back, or abdomen. °· You have shortness of breath. °· You have an increasing cough, or you cough up blood. °· You have  severe back or abdominal pain. °· You feel nauseous or vomit. °· You have severe weakness. °· You faint. °· You have chills. °This is an emergency. Do not wait to see if the pain will go away. Get medical help at once. Call your local emergency services (911 in [place] Do not drive yourself to the hospital. °MAKE SURE YOU:  °· Understand these instructions. °· Will watch your condition. °· Will get help right away if you are not doing well or get worse. °Document Released: 08/23/2005 Document Revised: 11/18/2013 Document Reviewed: 06/18/2008 °[employer]® Patient Information ©2015 [employer]. This information is not intended to replace advice given to you by your health care provider. Make sure you discuss any questions you have with your health care provider. ° °

## 2015-05-21 NOTE — ED Notes (Signed)
MD at the bedside  

## 2015-05-26 ENCOUNTER — Telehealth: Payer: Self-pay | Admitting: Cardiology

## 2015-05-26 NOTE — Telephone Encounter (Signed)
Returned call to patient he stated atorvastatin is causing muscle pain in chest and legs.Stated he stopped taking and no muscle pain.Advised I will speak to Dr.Jordan and call you back.

## 2015-05-26 NOTE — Telephone Encounter (Signed)
Pt called in stating that Dr. [place] wanted him to discontinue his BP medications but to continue taking his cholesterol meds. He says that by just being on Atorvastatin he started to have bad muscle spasms and his pharmacist told him that was a side effect of his cholesterol medication. So he stopped taking that as well and now his spasms have stopped. He would like to know if there is another medication he can take in the place of Atorvastatin. Please call  Thanks

## 2015-05-27 ENCOUNTER — Encounter (HOSPITAL_COMMUNITY): Payer: Self-pay | Admitting: Emergency Medicine

## 2015-05-27 ENCOUNTER — Emergency Department (HOSPITAL_COMMUNITY)
Admission: EM | Admit: 2015-05-27 | Discharge: 2015-05-28 | Disposition: A | Discharge status: Home / Self Care | Payer: Medicare Other | Attending: Emergency Medicine | Admitting: Emergency Medicine

## 2015-05-27 DIAGNOSIS — Z720 Tobacco use: Secondary | ICD-10-CM | POA: Diagnosis not present

## 2015-05-27 DIAGNOSIS — Z7982 Long term (current) use of aspirin: Secondary | ICD-10-CM | POA: Diagnosis not present

## 2015-05-27 DIAGNOSIS — H6123 Impacted cerumen, bilateral: Secondary | ICD-10-CM | POA: Insufficient documentation

## 2015-05-27 DIAGNOSIS — R51 Headache: Secondary | ICD-10-CM | POA: Diagnosis not present

## 2015-05-27 DIAGNOSIS — Z880 Allergy status to penicillin: Secondary | ICD-10-CM | POA: Insufficient documentation

## 2015-05-27 DIAGNOSIS — I2510 Atherosclerotic heart disease of native coronary artery without angina pectoris: Secondary | ICD-10-CM | POA: Diagnosis not present

## 2015-05-27 DIAGNOSIS — E876 Hypokalemia: Secondary | ICD-10-CM | POA: Diagnosis not present

## 2015-05-27 DIAGNOSIS — H81399 Other peripheral vertigo, unspecified ear: Secondary | ICD-10-CM | POA: Insufficient documentation

## 2015-05-27 DIAGNOSIS — Z9861 Coronary angioplasty status: Secondary | ICD-10-CM | POA: Diagnosis not present

## 2015-05-27 DIAGNOSIS — M1990 Unspecified osteoarthritis, unspecified site: Secondary | ICD-10-CM | POA: Diagnosis not present

## 2015-05-27 DIAGNOSIS — Z9889 Other specified postprocedural states: Secondary | ICD-10-CM | POA: Insufficient documentation

## 2015-05-27 DIAGNOSIS — R61 Generalized hyperhidrosis: Secondary | ICD-10-CM | POA: Insufficient documentation

## 2015-05-27 DIAGNOSIS — I252 Old myocardial infarction: Secondary | ICD-10-CM | POA: Insufficient documentation

## 2015-05-27 DIAGNOSIS — R002 Palpitations: Secondary | ICD-10-CM | POA: Diagnosis present

## 2015-05-27 DIAGNOSIS — Z79899 Other long term (current) drug therapy: Secondary | ICD-10-CM | POA: Diagnosis not present

## 2015-05-27 NOTE — ED Provider Notes (Signed)
CSN: [id number]     Arrival date & time 05/27/15  2304 History    This chart was scribed for Dione Booze, MD by Arlan Organ, ED Scribe. This patient was seen in room A08C/A08C and the patient's care was started 11:57 PM.   Chief Complaint  Patient presents with  . Palpitations   The history is provided by the patient. No language interpreter was used.    HPI Comments: Mahamud Metts is a 64 y.o. male with a PMHx of CAD, NSTEMI, and hyperlipidemia who presents to the Emergency Department here for intermittent, ongoing, unchanged palpitations onset 2-3 hours prior to arrival. Pt also reports dizziness, "feeling drunk", unsteady balance, diaphoresis, and HA. Mr. Jansma attributes current symptoms to a possible reaction to Lisinopril. Last dose taken earlier this evening followed by onset of symptoms approximately an hour afterwards. No recent fever, chills, chest pain, or shortness of breath. He is followed by Dr. Peter [place] of [facility]. PSHx includes L heart catheterization with coronary angiogram-12/22/2014 and Percutaneous coronary stent stent intervention 12/23/2014. In addition, pt is scheduled for a CABG July 18 of this year. Pt with known known allergies to Penicillins and Atorvastatin.   Past Medical History  Diagnosis Date  . Osteoarthritis   . Coronary artery disease     a. Big NSTEMI 11/2014: cath with surgical disease but patient initially refused CABG. Compromise decision was made to do intervention on the RCA with a bare metal stent with medical therapy for other disease and follow-up as an outpatient.  . Myocardial infarction     NSTEMI  . HOH (hard of hearing)   . Tobacco abuse   . Ischemic cardiomyopathy     a. a. Cath 12/22/14: EF 25%. b. 2D Echo 12/23/14: EF 25-30%, diffuse hypokinesis, akinesis of the entireinferolateral and inferior myocardium, mild MR.  [name] Hyperlipidemia   . Transaminitis    Past Surgical History  Procedure Laterality Date  . Left heart  catheterization with coronary angiogram N/A 12/22/2014    Procedure: LEFT HEART CATHETERIZATION WITH CORONARY ANGIOGRAM;  Surgeon: Corky Crafts, MD; LAD 100%, CFX 90%, RI mild dz, RCA 95%/70%, EF 25%, CABG recommended  . Percutaneous coronary stent intervention (pci-s) N/A 12/23/2014    Procedure: PERCUTANEOUS CORONARY STENT INTERVENTION (PCI-S);  Surgeon: Corky Crafts, MD; 3.0 x 24 rebel BMS to the RCA    Family History  Problem Relation Age of Onset  . Hypertension    . Heart attack Neg Hx   . Stroke Neg Hx   . Cancer Father    History  Substance Use Topics  . Smoking status: Current Every Day Smoker -- 0.00 packs/day for 50 years    Types: Cigarettes  . Smokeless tobacco: Current User  . Alcohol Use: No    Review of Systems  Constitutional: Positive for diaphoresis. Negative for fever and chills.  Respiratory: Negative for shortness of breath.   Cardiovascular: Positive for palpitations. Negative for chest pain.  Gastrointestinal: Negative for nausea and vomiting.  Musculoskeletal: Negative for neck pain.  Neurological: Positive for dizziness and headaches.  All other systems reviewed and are negative.     Allergies  Penicillins and Atorvastatin  Home Medications   Prior to Admission medications   Medication Sig Start Date End Date Taking? Authorizing Provider  aspirin 81 MG tablet Take 81 mg by mouth daily.    Historical Provider, MD  docusate sodium (COLACE) 100 MG capsule Take 100 mg by mouth daily as needed for mild constipation.  Historical Provider, MD  nitroGLYCERIN (NITROSTAT) 0.4 MG SL tablet Place 1 tablet (0.4 mg total) under the tongue every 5 (five) minutes as needed for chest pain. 12/24/14   Rhonda G Barrett, PA-C  prasugrel (EFFIENT) 10 MG TABS tablet Take 1 tablet (10 mg total) by mouth daily. 12/24/14   Joline Salt Barrett, PA-C   Triage Vitals: BP 121/67 mmHg  Pulse 63  Temp(Src) 98.1 F (36.7 C)  Resp 20  SpO2 99%   Physical Exam   Constitutional: He is oriented to person, place, and time. He appears well-developed and well-nourished.  HENT:  Head: Normocephalic and atraumatic.  Cerumen impaction bilaterally noted  Eyes: EOM are normal. Pupils are equal, round, and reactive to light.  Neck: Normal range of motion. Neck supple. No JVD present. Carotid bruit is not present.  FROM of neck  Cardiovascular: Normal rate, regular rhythm and normal heart sounds.   No murmur heard. Pulmonary/Chest: Effort normal and breath sounds normal. He has no wheezes. He has no rales. He exhibits no tenderness.  Abdominal: Soft. Bowel sounds are normal. He exhibits no distension and no mass. There is no tenderness.  Musculoskeletal: Normal range of motion. He exhibits no edema.  Lymphadenopathy:    He has no cervical adenopathy.  Neurological: He is alert and oriented to person, place, and time. No cranial nerve deficit. He exhibits normal muscle tone. Coordination normal.  Symptoms reproduced by head movement   Skin: Skin is warm and dry. No rash noted.  Psychiatric: He has a normal mood and affect. His behavior is normal. Judgment and thought content normal.  Nursing note and vitals reviewed.   ED Course  Procedures (including critical care time)  DIAGNOSTIC STUDIES: Oxygen Saturation is 99% on RA, Normal by my interpretation.    COORDINATION OF CARE: 12:00 AM- Will order EKG, CBC, BMP, I-stat troponin. Will give Antivert. Discussed treatment plan with pt at bedside and pt agreed to plan.       Labs Review Results for orders placed or performed during the hospital encounter of 05/27/15  CBC  Result Value Ref Range   WBC 6.0 4.0 - 10.5 K/uL   RBC 4.38 4.22 - 5.81 MIL/uL   Hemoglobin 15.3 13.0 - 17.0 g/dL   HCT 16.1 09.6 - 04.5 %   MCV 97.9 78.0 - 100.0 fL   MCH 34.9 (H) 26.0 - 34.0 pg   MCHC 35.7 30.0 - 36.0 g/dL   RDW 40.9 81.1 - 91.4 %   Platelets 203 150 - 400 K/uL  Basic metabolic panel  Result Value Ref Range    Sodium 137 135 - 145 mmol/L   Potassium 3.3 (L) 3.5 - 5.1 mmol/L   Chloride 103 101 - 111 mmol/L   CO2 25 22 - 32 mmol/L   Glucose, Bld 93 65 - 99 mg/dL   BUN 10 6 - 20 mg/dL   Creatinine, Ser 7.82 0.61 - 1.24 mg/dL   Calcium 8.8 (L) 8.9 - 10.3 mg/dL   GFR calc non Af Amer >60 >60 mL/min   GFR calc Af Amer >60 >60 mL/min   Anion gap 9 5 - 15  I-stat troponin, ED  (not at [facility], [facility])  Result Value Ref Range   Troponin i, poc 0.02 0.00 - 0.08 ng/mL   Comment 3             EKG Interpretation   Date/Time:  Thursday May 27 2015 23:07:10 EDT Ventricular Rate:  77 PR Interval:  122 QRS  Duration: 114 QT Interval:  426 QTC Calculation: 482 R Axis:   -53 Text Interpretation:  Normal sinus rhythm Left axis deviation Pulmonary  disease pattern Septal infarct , age undetermined Abnormal ECG When  compared with ECG of 05/21/2015, T wave abnormality has improved Confirmed  by [facility]  MD, Tarus Briski ([id number]) on 05/28/2015 12:10:30 AM      MDM   Final diagnoses:  Peripheral vertigo, unspecified laterality  Cerumen impaction, bilateral  Hypokalemia    Transient dizziness of uncertain cause. Patient relates that he thinks the dizziness is from his lisinopril. I have expanded in that I do not think that this is likely. Dizziness is reproduced by head movement so will give a therapeutic trial of meclizine. Also, bilateral cerumen impactions are present. This may be contributing to his dizziness. Cerumen impactions are cleared by nursing and he noted that his hearing has improved. Dizziness is markedly improved following a dose of meclizine. Old records reviewed and he had presented with acute coronary syndrome in January and had bypass surgery recommended but he had refused surgery at that point. Surgery has been rescheduled for later this month. Patient states that he does not wish to take his lisinopril anymore because he is certain that that is causing his problems. In spite of my attempts to convince  him otherwise, he is adamant that he will not be taking the lisinopril. I have offered to try him on a different ACE inhibitor to see if he could tolerate that better and is agreeable to this. He is discharged with prescriptions for meclizine and clonazepam. Of note, he did have a mild hypokalemia present on his laboratory testing and is given a single dose of K-Dur in the ED.  I personally performed the services described in this documentation, which was scribed in my presence. The recorded information has been reviewed and is accurate.      Dione Boozeavid Princesa Willig, MD 05/28/15 [phone]

## 2015-05-27 NOTE — ED Notes (Signed)
Pt. reports palpitations with dizziness and neck pain onset this evening , denies chest pain , pt. stated that he is scheduled for CABG on July 18 , his cardiologist is Dr. SwazilandJordan.

## 2015-05-28 DIAGNOSIS — H81399 Other peripheral vertigo, unspecified ear: Secondary | ICD-10-CM | POA: Diagnosis not present

## 2015-05-28 LAB — CBC
HCT: 42.9 % (ref 39.0–52.0)
HEMOGLOBIN: 15.3 g/dL (ref 13.0–17.0)
MCH: 34.9 pg — ABNORMAL HIGH (ref 26.0–34.0)
MCHC: 35.7 g/dL (ref 30.0–36.0)
MCV: 97.9 fL (ref 78.0–100.0)
PLATELETS: 203 10*3/uL (ref 150–400)
RBC: 4.38 MIL/uL (ref 4.22–5.81)
RDW: 14.5 % (ref 11.5–15.5)
WBC: 6 10*3/uL (ref 4.0–10.5)

## 2015-05-28 LAB — I-STAT TROPONIN, ED: Troponin i, poc: 0.02 ng/mL (ref 0.00–0.08)

## 2015-05-28 LAB — BASIC METABOLIC PANEL
ANION GAP: 9 (ref 5–15)
BUN: 10 mg/dL (ref 6–20)
CALCIUM: 8.8 mg/dL — AB (ref 8.9–10.3)
CHLORIDE: 103 mmol/L (ref 101–111)
CO2: 25 mmol/L (ref 22–32)
Creatinine, Ser: 0.98 mg/dL (ref 0.61–1.24)
GFR calc non Af Amer: >60 mL/min (ref >60)
GLUCOSE: 93 mg/dL (ref 65–99)
POTASSIUM: 3.3 mmol/L — AB (ref 3.5–5.1)
Sodium: 137 mmol/L (ref 135–145)

## 2015-05-28 MED ORDER — MECLIZINE HCL 25 MG PO TABS
25.0000 mg | ORAL_TABLET | Freq: Once | ORAL | Status: AC
  Administered 2015-05-28: 25 mg via ORAL
  Filled 2015-05-28: qty 1

## 2015-05-28 MED ORDER — BENAZEPRIL HCL 5 MG PO TABS: 5.0000 mg | ORAL_TABLET | Freq: Every day | ORAL | Status: DC

## 2015-05-28 MED ORDER — POTASSIUM CHLORIDE CRYS ER 20 MEQ PO TBCR
40.0000 meq | EXTENDED_RELEASE_TABLET | Freq: Once | ORAL | Status: DC
  Filled 2015-05-28: qty 2

## 2015-05-28 MED ORDER — POTASSIUM CHLORIDE CRYS ER 20 MEQ PO TBCR: 40.0000 meq | EXTENDED_RELEASE_TABLET | Freq: Once | ORAL | Status: DC

## 2015-05-28 MED ORDER — MECLIZINE HCL 25 MG PO TABS: 25.0000 mg | ORAL_TABLET | Freq: Three times a day (TID) | ORAL | Status: DC | PRN

## 2015-05-28 NOTE — ED Notes (Signed)
Pt reports his HA is gone and is no longer dizzy. Pt A&Ox4, ambulatory at d/c with steady gait NAD

## 2015-05-28 NOTE — ED Notes (Signed)
Peroxide in both ears to dissolve cerumen.

## 2015-05-28 NOTE — Telephone Encounter (Signed)
Returned call to patient spoke to Dr.Jordan he advised ok to stop atorvastatin.Advised will discuss different statin after CABG.

## 2015-05-28 NOTE — Discharge Instructions (Signed)
Stop taking lisinopril, start taking benazepril.  Dizziness Dizziness is a common problem. It is a feeling of unsteadiness or light-headedness. You may feel like you are about to faint. Dizziness can lead to injury if you stumble or fall. A person of any age group can suffer from dizziness, but dizziness is more common in older adults. CAUSES  Dizziness can be caused by many different things, including:  Middle ear problems.  Standing for too long.  Infections.  An allergic reaction.  Aging.  An emotional response to something, such as the sight of blood.  Side effects of medicines.  Tiredness.  Problems with circulation or blood pressure.  Excessive use of alcohol or medicines, or illegal drug use.  Breathing too fast (hyperventilation).  An irregular heart rhythm (arrhythmia).  A low red blood cell count (anemia).  Pregnancy.  Vomiting, diarrhea, fever, or other illnesses that cause body fluid loss (dehydration).  Diseases or conditions such as Parkinson's disease, high blood pressure (hypertension), diabetes, and thyroid problems.  Exposure to extreme heat. DIAGNOSIS  Your health care provider will ask about your symptoms, perform a physical exam, and perform an electrocardiogram (ECG) to record the electrical activity of your heart. Your health care provider may also perform other heart or blood tests to determine the cause of your dizziness. These may include:  Transthoracic echocardiogram (TTE). During echocardiography, sound waves are used to evaluate how blood flows through your heart.  Transesophageal echocardiogram (TEE).  Cardiac monitoring. This allows your health care provider to monitor your heart rate and rhythm in real time.  Holter monitor. This is a portable device that records your heartbeat and can help diagnose heart arrhythmias. It allows your health care provider to track your heart activity for several days if needed.  Stress tests by  exercise or by giving medicine that makes the heart beat faster. TREATMENT  Treatment of dizziness depends on the cause of your symptoms and can vary greatly. HOME CARE INSTRUCTIONS   Drink enough fluids to keep your urine clear or pale yellow. This is especially important in very hot weather. In older adults, it is also important in cold weather.  Take your medicine exactly as directed if your dizziness is caused by medicines. When taking blood pressure medicines, it is especially important to get up slowly.  Rise slowly from chairs and steady yourself until you feel okay.  In the morning, first sit up on the side of the bed. When you feel okay, stand slowly while holding onto something until you know your balance is fine.  Move your legs often if you need to stand in one place for a long time. Tighten and relax your muscles in your legs while standing.  Have someone stay with you for 1-2 days if dizziness continues to be a problem. Do this until you feel you are well enough to stay alone. Have the person call your health care provider if he or she notices changes in you that are concerning.  Do not drive or use heavy machinery if you feel dizzy.  Do not drink alcohol. SEEK IMMEDIATE MEDICAL CARE IF:   Your dizziness or light-headedness gets worse.  You feel nauseous or vomit.  You have problems talking, walking, or using your arms, hands, or legs.  You feel weak.  You are not thinking clearly or you have trouble forming sentences. It may take a friend or family member to notice this.  You have chest pain, abdominal pain, shortness of breath, or sweating.  Your vision changes.  You notice any bleeding.  You have side effects from medicine that seems to be getting worse rather than better. MAKE SURE YOU:   Understand these instructions.  Will watch your condition.  Will get help right away if you are not doing well or get worse. Document Released: 05/09/2001 Document  Revised: 11/18/2013 Document Reviewed: 06/02/2011 [facility] Patient Information 2015 [place], [place]. This information is not intended to replace advice given to you by your health care provider. Make sure you discuss any questions you have with your health care provider.   Cerumen Impaction A cerumen impaction is when the wax in your ear forms a plug. This plug usually causes reduced hearing. Sometimes it also causes an earache or dizziness. Removing a cerumen impaction can be difficult and painful. The wax sticks to the ear canal. The canal is sensitive and bleeds easily. If you try to remove a heavy wax buildup with a cotton tipped swab, you may push it in further. Irrigation with water, suction, and small ear curettes may be used to clear out the wax. If the impaction is fixed to the skin in the ear canal, ear drops may be needed for a few days to loosen the wax. People who build up a lot of wax frequently can use ear wax removal products available in your local drugstore. SEEK MEDICAL CARE IF:  You develop an earache, increased hearing loss, or marked dizziness. Document Released: 12/21/2004 Document Revised: 02/05/2012 Document Reviewed: 02/10/2010 [facility] Patient Information 2015 [place], [place]. This information is not intended to replace advice given to you by your health care provider. Make sure you discuss any questions you have with your health care provider.  Benazepril tablets What is this medicine? BENAZEPRIL (ben AY ze pril) is an ACE inhibitor. This medicine is used to treat high blood pressure. This medicine may be used for other purposes; ask your health care provider or pharmacist if you have questions. COMMON BRAND NAME(S): Lotensin What should I tell my health care provider before I take this medicine? They need to know if you have any of these conditions: -bone marrow disease -heart or blood vessel disease -if you are on a special diet, such as a low salt diet -immune  system disease like lupus -kidney or liver disease -low blood pressure -previous swelling of the tongue, face, or lips with difficulty breathing, difficulty swallowing, hoarseness, or tightening of the throat -an unusual or allergic reaction to benazepril, other ACE inhibitors, insect venom, foods, dyes, or preservatives -pregnant or trying to get pregnant -breast-feeding How should I use this medicine? Take this medicine by mouth with a glass of water. Follow the directions on the prescription label. Take your doses at regular intervals. Do not take your medicine more often than directed. Do not stop taking this medicine except on the advice of your doctor or health care professional. Talk to your pediatrician regarding the use of this medicine in children. Special care may be needed. While this drug may be prescribed for children as young as 6 years, precautions do apply. Overdosage: If you think you have taken too much of this medicine contact a poison control center or emergency room at once. NOTE: This medicine is only for you. Do not share this medicine with others. What if I miss a dose? If you miss a dose, take it as soon as you can. If it is almost time for your next dose, take only that dose. Do not take double or extra  doses. What may interact with this medicine? -diuretics -everolimus -lithium -medicines for high blood pressure -NSAIDs, medicines for pain and inflammation, like ibuprofen or naproxen -potassium salts or potassium supplements -sirolimus -temsirolimus This list may not describe all possible interactions. Give your health care provider a list of all the medicines, herbs, non-prescription drugs, or dietary supplements you use. Also tell them if you smoke, drink alcohol, or use illegal drugs. Some items may interact with your medicine. What should I watch for while using this medicine? Visit your doctor or health care professional for regular checks on your progress.  Check your blood pressure as directed. Ask your doctor or health care professional what your blood pressure should be and when you should contact him or her. Call your doctor or health care professional if you notice an irregular or fast heart beat. Women should inform their doctor if they wish to become pregnant or think they might be pregnant. There is a potential for serious side effects to an unborn child. Talk to your health care professional or pharmacist for more information. Check with your doctor or health care professional if you get an attack of severe diarrhea, nausea and vomiting, or if you sweat a lot. The loss of too much body fluid can make it dangerous for you to take this medicine. You may get drowsy or dizzy. Do not drive, use machinery, or do anything that needs mental alertness until you know how this drug affects you. Do not stand or sit up quickly, especially if you are an older patient. This reduces the risk of dizzy or fainting spells. Alcohol can make you more drowsy and dizzy. Avoid alcoholic drinks. Avoid salt substitutes unless you are told otherwise by your doctor or health care professional. Do not treat yourself for coughs, colds, or pain while you are taking this medicine without asking your doctor or health care professional for advice. Some ingredients may increase your blood pressure. What side effects may I notice from receiving this medicine? Side effects that you should report to your doctor or health care professional as soon as possible: -allergic reactions like skin rash, itching or hives, swelling of the face, lips, or tongue -breathing problems -fast, irregular heartbeat -feeling faint or lightheaded, falls -problems swallowing -redness, blistering, peeling or loosening of the skin, including inside the mouth -swelling of ankles, legs -trouble passing urine or change in the amount of urine Side effects that usually do not require medical attention (report  to your doctor or health care professional if they continue or are bothersome): -cough -headache -nausea -sun sensitivity -tiredness This list may not describe all possible side effects. Call your doctor for medical advice about side effects. You may report side effects to FDA at 1-800-FDA-1088. Where should I keep my medicine? Keep out of the reach of children. Store at room temperature below 30 degrees C (86 degrees F). Protect from moisture. Keep container tightly closed. Throw away any unused medicine after the expiration date. NOTE: This sheet is a summary. It may not cover all possible information. If you have questions about this medicine, talk to your doctor, pharmacist, or health care provider.  2015, Elsevier/Gold Standard. (2014-03-24 13:46:52)  Meclizine tablets or capsules What is this medicine? MECLIZINE (MEK li zeen) is an antihistamine. It is used to prevent nausea, vomiting, or dizziness caused by motion sickness. It is also used to prevent and treat vertigo (extreme dizziness or a feeling that you or your surroundings are tilting or spinning around). This medicine  may be used for other purposes; ask your health care provider or pharmacist if you have questions. COMMON BRAND NAME(S): Antivert, Dramamine Less Drowsy, Medivert, Meni-D What should I tell my health care provider before I take this medicine? They need to know if you have any of these conditions: -asthma -glaucoma -prostate trouble -stomach problems -urinary problems -an unusual or allergic reaction to meclizine, other medicines, foods, dyes, or preservatives -pregnant or trying to get pregnant -breast-feeding How should I use this medicine? Take this medicine by mouth with a glass of water. Follow the directions on the prescription label. If you are using this medicine to prevent motion sickness, take the dose at least 1 hour before travel. If it upsets your stomach, take it with food or milk. Take your  doses at regular intervals. Do not take your medicine more often than directed. Talk to your pediatrician regarding the use of this medicine in children. Special care may be needed. Overdosage: If you think you have taken too much of this medicine contact a poison control center or emergency room at once. NOTE: This medicine is only for you. Do not share this medicine with others. What if I miss a dose? If you miss a dose, take it as soon as you can. If it is almost time for your next dose, take only that dose. Do not take double or extra doses. What may interact with this medicine? -barbiturate medicines for inducing sleep or treating seizures -digoxin -medicines for anxiety or sleeping problems, like alprazolam, diazepam or temazepam -medicines for hay fever and other allergies -medicines for mental depression -medicines for movement abnormalities as in Parkinson's disease, or for stomach problems -medicines for pain -medicines that relax muscles This list may not describe all possible interactions. Give your health care provider a list of all the medicines, herbs, non-prescription drugs, or dietary supplements you use. Also tell them if you smoke, drink alcohol, or use illegal drugs. Some items may interact with your medicine. What should I watch for while using this medicine? If you are taking this medicine on a regular schedule, visit your doctor or health care professional for regular checks on your progress. You may get dizzy, drowsy or have blurred vision. Do not drive, use machinery, or do anything that needs mental alertness until you know how this medicine affects you. Do not stand or sit up quickly, especially if you are an older patient. This reduces the risk of dizzy or fainting spells. Alcohol can increase possible dizziness. Avoid alcoholic drinks. Your mouth may get dry. Chewing sugarless gum or sucking hard candy, and drinking plenty of water may help. Contact your doctor if the  problem does not go away or is severe. This medicine may cause dry eyes and blurred vision. If you wear contact lenses you may feel some discomfort. Lubricating drops may help. See your eye doctor if the problem does not go away or is severe. What side effects may I notice from receiving this medicine? Side effects that you should report to your doctor or health care professional as soon as possible: -fainting spells -fast or irregular heartbeat Side effects that usually do not require medical attention (report to your doctor or health care professional if they continue or are bothersome): -constipation -difficulty passing urine -difficulty sleeping -headache -stomach upset This list may not describe all possible side effects. Call your doctor for medical advice about side effects. You may report side effects to FDA at 1-800-FDA-1088. Where should I keep my medicine?  Keep out of the reach of children. Store at room temperature between 15 and 30 degrees C (59 and 86 degrees F). Keep container tightly closed. Throw away any unused medicine after the expiration date. NOTE: This sheet is a summary. It may not cover all possible information. If you have questions about this medicine, talk to your doctor, pharmacist, or health care provider.  2015, Elsevier/Gold Standard. (2008-05-21 10:35:36)

## 2015-06-07 ENCOUNTER — Telehealth: Payer: Self-pay | Admitting: *Deleted

## 2015-06-07 ENCOUNTER — Other Ambulatory Visit: Payer: Self-pay | Admitting: *Deleted

## 2015-06-07 NOTE — Telephone Encounter (Signed)
Patient called to cancel his CABG surgery with Dr. Laneta SimmersBartle on Monday 7/18.  He said he was feeling great and didn't want surgery at this time.  I told him to please call back if he decided to change his mind.  Drs. Bartle & [place] made aware.

## 2015-06-10 ENCOUNTER — Other Ambulatory Visit (HOSPITAL_COMMUNITY): Payer: Medicare Other

## 2015-06-10 ENCOUNTER — Telehealth: Payer: Self-pay

## 2015-06-10 ENCOUNTER — Encounter (HOSPITAL_COMMUNITY): Payer: Medicare Other

## 2015-06-10 NOTE — Telephone Encounter (Signed)
Patient called no answer.Left message on personal voice mail received a message from Dr.Bartle's nurse wanting to change cholesterol med.I'am out of office 7/15.I did speak to Dr.Jordan if you would call me back 06/14/15.

## 2015-06-14 ENCOUNTER — Encounter (HOSPITAL_COMMUNITY): Admission: RE | Payer: Self-pay | Source: Ambulatory Visit

## 2015-06-14 ENCOUNTER — Inpatient Hospital Stay (HOSPITAL_COMMUNITY): Admission: RE | Admit: 2015-06-14 | Payer: Medicare Other | Source: Ambulatory Visit | Admitting: Surgery

## 2015-06-14 ENCOUNTER — Telehealth: Payer: Self-pay

## 2015-06-14 SURGERY — CORONARY ARTERY BYPASS GRAFTING (CABG)
Anesthesia: General | Site: Chest

## 2015-06-14 MED ORDER — PRAVASTATIN SODIUM 40 MG PO TABS: 40.0000 mg | ORAL_TABLET | Freq: Every evening | ORAL | Status: DC

## 2015-06-14 NOTE — Telephone Encounter (Signed)
Received call back from patient.He stated he was feeling better since he stopped his B/P medication.Stated he is only taking effient and aspirin.Advised he needs to monitor B/P and call if B/P becomes elevated.Stated he was calling back for a different cholesterol medication.Atorvastatin causing muscle pain.Dr.Jordan advised pravastatin 40 mg daily.Appointment scheduled with Dr.Jordan 08/30/15 at 9:30 am.Advised to call sooner if needed.

## 2015-06-16 ENCOUNTER — Telehealth: Payer: Self-pay | Admitting: Cardiology

## 2015-06-16 NOTE — Telephone Encounter (Signed)
He will not be able to afford Zetia. We can repeat lipids and chemistry panel in October.  Miguel Medal [place] MD, [facility]

## 2015-06-16 NOTE — Telephone Encounter (Signed)
Picked up pravastatin yesterday and took first dose after supper - had muscle spasms all night long. Has tried atorvastatin with same SE. He states he was told to call back if he had any issues.   Last labs in March - cholesterol good.   Patient reports he has been off all cholesterol for about 1 month.   Patient is due to see Dr. SwazilandJordan 10/3  Patient would like to have his cholesterol checked - informed him this order has to come from MD  Message routed to Dr. SwazilandJordan & Elnita Maxwellheryl

## 2015-06-16 NOTE — Telephone Encounter (Signed)
The Pravastatin is making him having muscle pains,they are real bad. Please call to advise.

## 2015-06-17 NOTE — Telephone Encounter (Signed)
Returned call to patient.Dr.Jordan advised we will repeat fasting lipid and hepatic panels at 09/04/15 office visit.Advised to call sooner if needed.

## 2015-08-14 IMAGING — DX DG CHEST 2V
2 series · 2 of 2 positions shown · non-contrast
Comparison: 05/02/2015

CLINICAL DATA: Chest pain and shortness of Breath

EXAM:
CHEST - 2 VIEW

[chest pa]
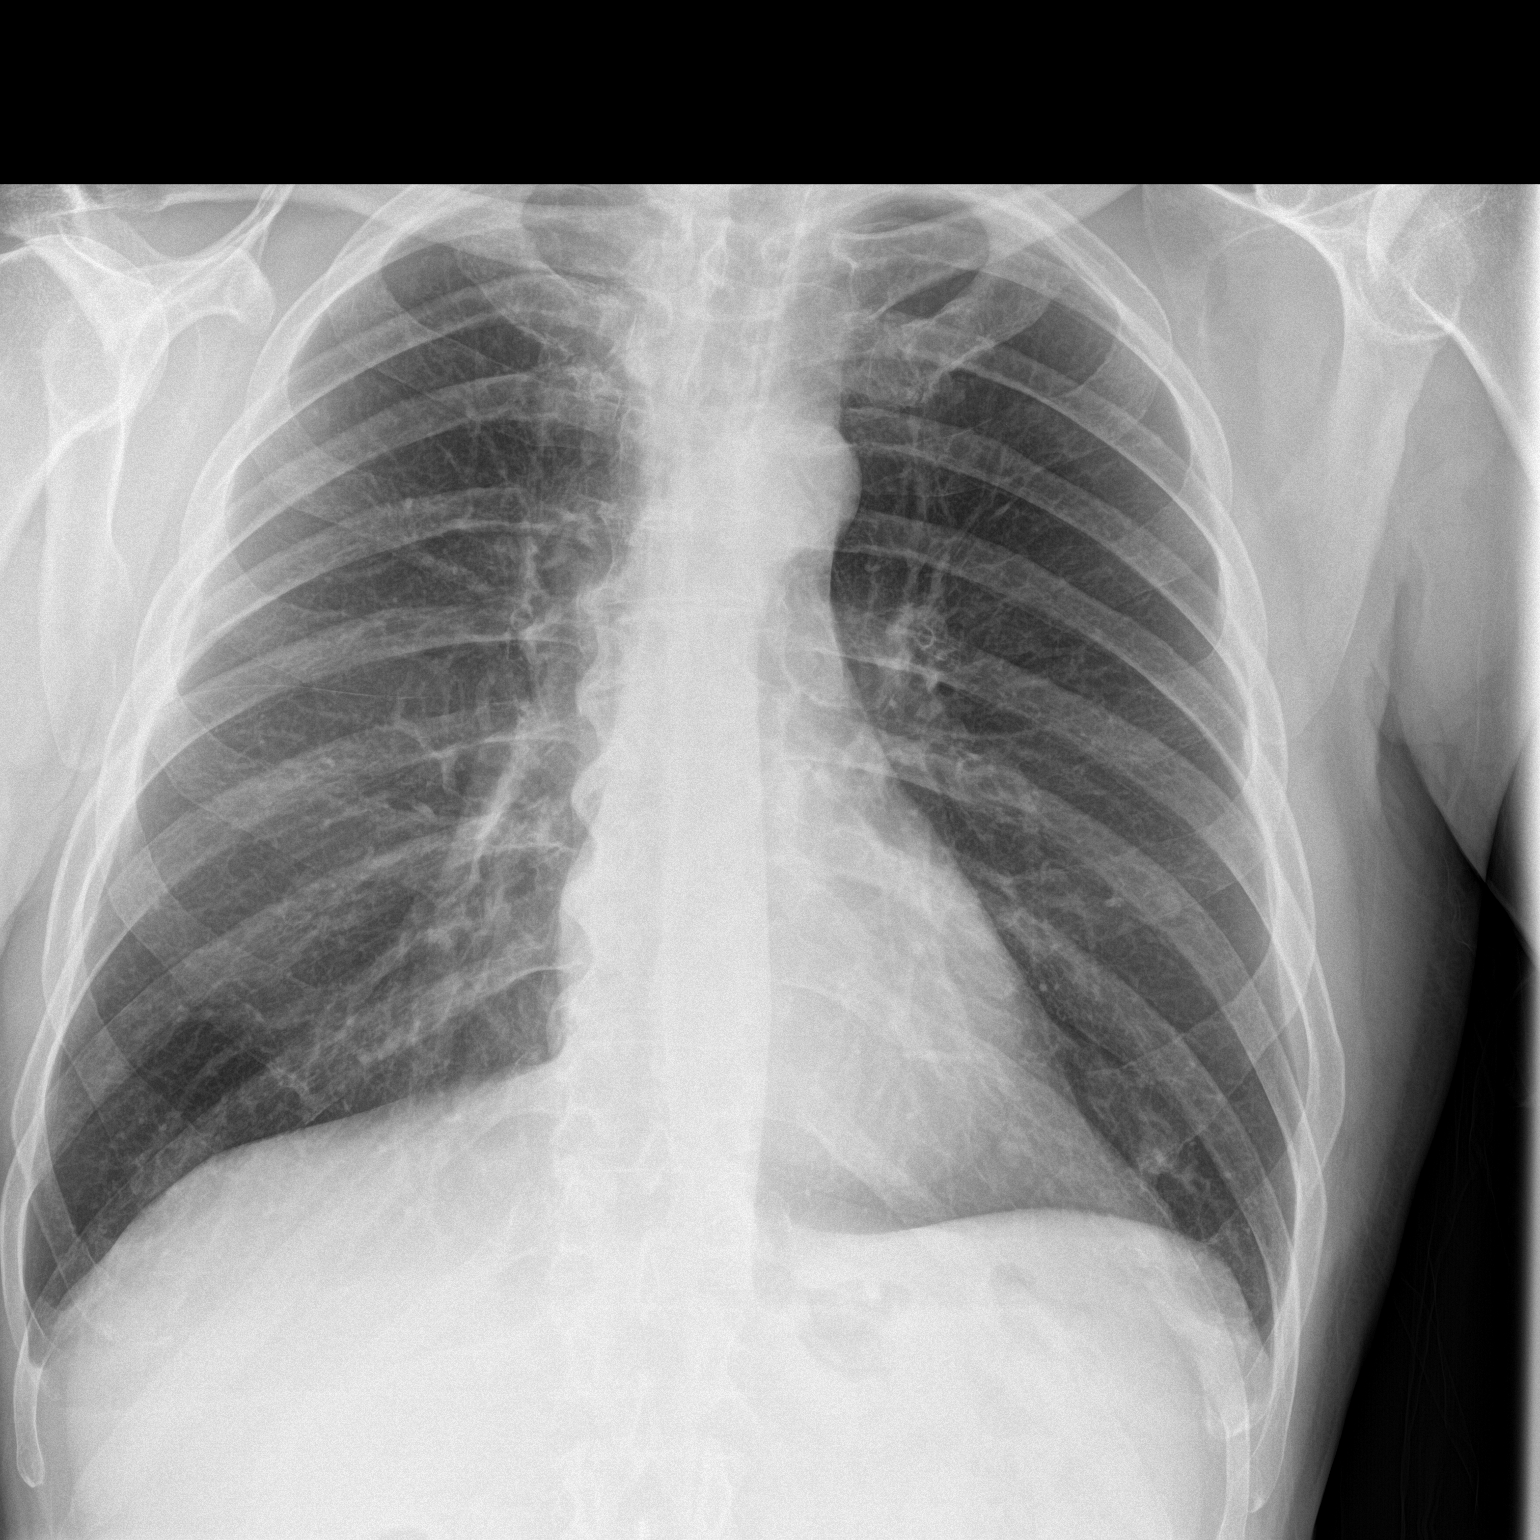

[chest lat]
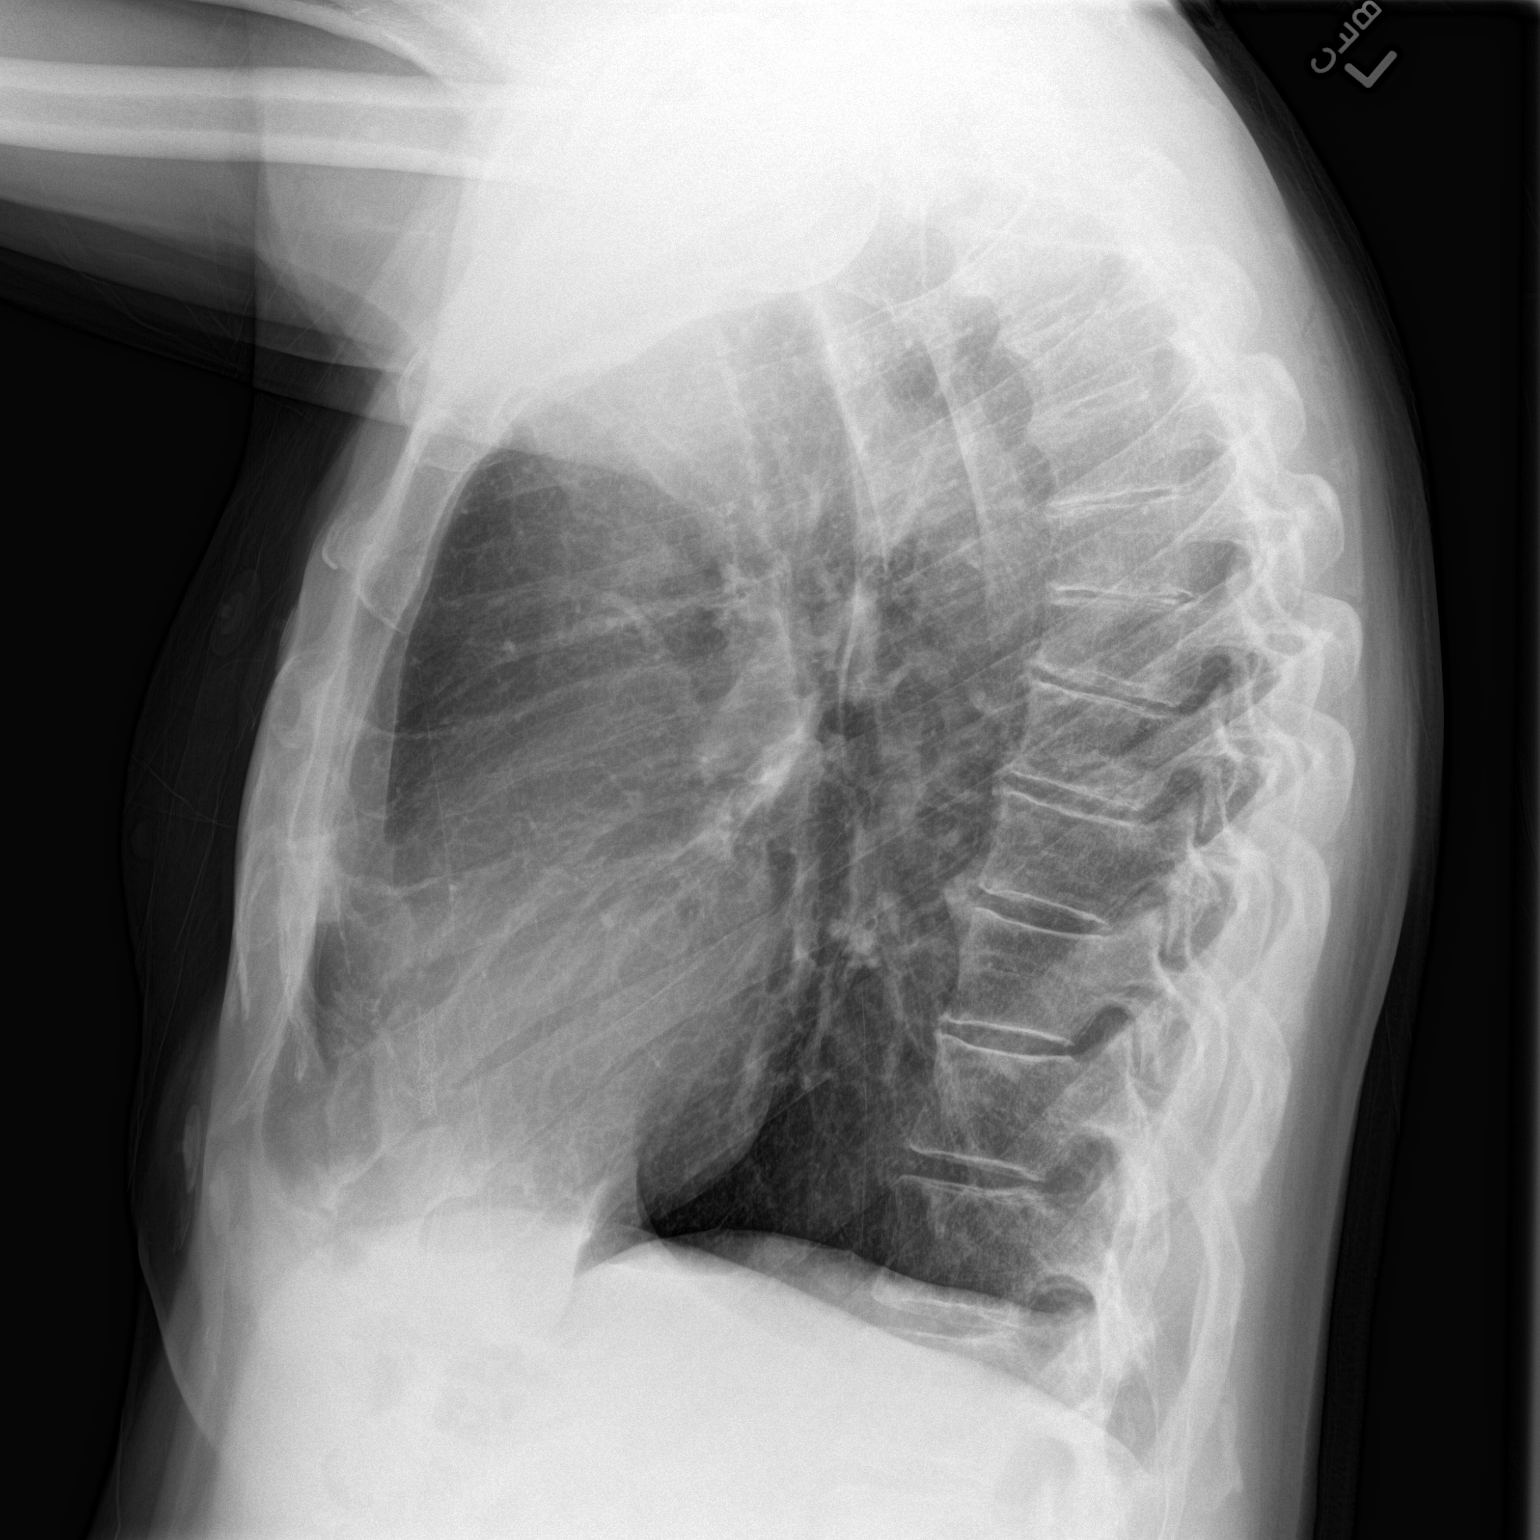

[2 of 2 positions shown; findings below may reference images not displayed]

FINDINGS: The heart size and mediastinal contours are within normal limits.
Both lungs are clear. The visualized skeletal structures are
unremarkable.
IMPRESSION: No active disease.

## 2015-08-16 ENCOUNTER — Telehealth: Payer: Self-pay | Admitting: Cardiology

## 2015-08-16 NOTE — Telephone Encounter (Signed)
Returned call to patient.He stated he is not doing good.Stated he had to leave the apartment were he was living due to price increase.He is living in a shelter in [place].Stated he cannot eat the food in shelter,has chest pain every time he eats their food.Stated he is only able to eat 1 meal a day and has lost 15 lbs within the last 1 month. Stated he is trying to get into [facility] assisted living .He needs a FL2 form filled out.Advised he will have to have PCP fill out.

## 2015-08-16 NOTE — Telephone Encounter (Signed)
Pt called in wanting to speak with Casey Jackson. He says that he is trying to get into an Assisted Living community but he needs a FL02 form. Please assist pt with this  Thanks

## 2015-08-30 ENCOUNTER — Ambulatory Visit: Payer: Medicare Other | Admitting: Cardiology

## 2015-10-25 ENCOUNTER — Emergency Department (HOSPITAL_COMMUNITY)
Admission: EM | Admit: 2015-10-25 | Discharge: 2015-10-25 | Disposition: A | Discharge status: Home / Self Care | Payer: Medicare Other | Attending: Emergency Medicine | Admitting: Emergency Medicine

## 2015-10-25 ENCOUNTER — Emergency Department (HOSPITAL_COMMUNITY): Payer: Medicare Other

## 2015-10-25 DIAGNOSIS — I2510 Atherosclerotic heart disease of native coronary artery without angina pectoris: Secondary | ICD-10-CM | POA: Insufficient documentation

## 2015-10-25 DIAGNOSIS — R002 Palpitations: Secondary | ICD-10-CM | POA: Diagnosis not present

## 2015-10-25 DIAGNOSIS — I5022 Chronic systolic (congestive) heart failure: Secondary | ICD-10-CM | POA: Diagnosis not present

## 2015-10-25 DIAGNOSIS — R079 Chest pain, unspecified: Secondary | ICD-10-CM | POA: Insufficient documentation

## 2015-10-25 DIAGNOSIS — Z880 Allergy status to penicillin: Secondary | ICD-10-CM | POA: Insufficient documentation

## 2015-10-25 DIAGNOSIS — I252 Old myocardial infarction: Secondary | ICD-10-CM | POA: Diagnosis not present

## 2015-10-25 DIAGNOSIS — E785 Hyperlipidemia, unspecified: Secondary | ICD-10-CM | POA: Insufficient documentation

## 2015-10-25 DIAGNOSIS — Z7982 Long term (current) use of aspirin: Secondary | ICD-10-CM | POA: Insufficient documentation

## 2015-10-25 LAB — I-STAT TROPONIN, ED: Troponin i, poc: 0.01 ng/mL (ref 0.00–0.08)

## 2015-10-25 LAB — BASIC METABOLIC PANEL
Anion gap: 6 (ref 5–15)
BUN: 8 mg/dL (ref 6–20)
CALCIUM: 9.1 mg/dL (ref 8.9–10.3)
CHLORIDE: 105 mmol/L (ref 101–111)
CO2: 29 mmol/L (ref 22–32)
CREATININE: 1.01 mg/dL (ref 0.61–1.24)
GFR calc Af Amer: >60 mL/min (ref >60)
Glucose, Bld: 106 mg/dL — ABNORMAL HIGH (ref 65–99)
POTASSIUM: 4.2 mmol/L (ref 3.5–5.1)
SODIUM: 140 mmol/L (ref 135–145)

## 2015-10-25 LAB — CBC
HCT: 43.4 % (ref 39.0–52.0)
HEMOGLOBIN: 14.6 g/dL (ref 13.0–17.0)
MCH: 33.9 pg (ref 26.0–34.0)
MCHC: 33.6 g/dL (ref 30.0–36.0)
MCV: 100.7 fL — ABNORMAL HIGH (ref 78.0–100.0)
PLATELETS: 256 10*3/uL (ref 150–400)
RBC: 4.31 MIL/uL (ref 4.22–5.81)
RDW: 13 % (ref 11.5–15.5)
WBC: 7 10*3/uL (ref 4.0–10.5)

## 2015-10-25 MED ORDER — ASPIRIN 81 MG PO CHEW
CHEWABLE_TABLET | ORAL | Status: AC
  Filled 2015-10-25: qty 1

## 2015-10-25 MED ORDER — ASPIRIN 81 MG PO TABS: 81.0000 mg | ORAL_TABLET | Freq: Once | ORAL | Status: DC

## 2015-10-25 MED ORDER — PRASUGREL HCL 10 MG PO TABS
10.0000 mg | ORAL_TABLET | Freq: Once | ORAL | Status: AC
  Administered 2015-10-25: 10 mg via ORAL
  Filled 2015-10-25: qty 1

## 2015-10-25 NOTE — ED Notes (Signed)
Report to Robin at [facility]. [facility] contacted for transport.

## 2015-10-25 NOTE — Consult Note (Signed)
CARDIOLOGY CONSULT NOTE   Patient ID: Casey Jackson MRN: [id number], DOB/AGE: 01/23/1951   Admit date: 10/25/2015 Date of Consult: 10/25/2015   Primary Physician: Dorrene German, MD Primary Cardiologist: Dr. [place]  Pt. Profile   64 year old male with past medical history of HLD, tobacco abuse, CAD s/p PCI to RCA on 12/22/2014, and ICM with baseline EF 20-25% who has delayed CABG multiple times came in with palpitation after taking Vit D.  Problem List  Past Medical History  Diagnosis Date  . Osteoarthritis   . Coronary artery disease     a. Big NSTEMI 11/2014: cath with surgical disease but patient initially refused CABG. Compromise decision was made to do intervention on the RCA with a bare metal stent with medical therapy for other disease and follow-up as an outpatient.  . Myocardial infarction [facility])     NSTEMI  . HOH (hard of hearing)   . Tobacco abuse   . Ischemic cardiomyopathy     a. a. Cath 12/22/14: EF 25%. b. 2D Echo 12/23/14: EF 25-30%, diffuse hypokinesis, akinesis of the entireinferolateral and inferior myocardium, mild MR.  [name] Hyperlipidemia   . Transaminitis     Past Surgical History  Procedure Laterality Date  . Left heart catheterization with coronary angiogram N/A 12/22/2014    Procedure: LEFT HEART CATHETERIZATION WITH CORONARY ANGIOGRAM;  Surgeon: Corky Crafts, MD; LAD 100%, CFX 90%, RI mild dz, RCA 95%/70%, EF 25%, CABG recommended  . Percutaneous coronary stent intervention (pci-s) N/A 12/23/2014    Procedure: PERCUTANEOUS CORONARY STENT INTERVENTION (PCI-S);  Surgeon: Corky Crafts, MD; 3.0 x 24 rebel BMS to the RCA      Allergies  Allergies  Allergen Reactions  . Penicillins Swelling    HPI   Casey Jackson is a 64 year old male with past medical history of HLD, tobacco abuse, CAD s/p PCI to RCA on 12/22/2014, and ICM with baseline EF 20-25%. He originally came in January 2016, cardiac catheterization at that time showed EF 25%, occluded  LAD with L to L collateral, severe dx in mid LCx and large OM2, severe dx in mid and distal RCA. CT surgery consulted given his poor social service and likely inability to be compliant with DAPT for prolonged period of time. He was seen by Dr. Laneta Simmers who agreed with bypass surgery. Unfortunately, he later changed his mind and attempted to leave AMA on 12/22/2014. Multiple parties tried to discuss with him the high risk of sudden cardiac cath given the significant CAD and failed to convince him, eventually, Dr. Eldridge Dace was able to convince him to stay 2 more day and have PCI/BMS of mid RCA lesion so he can return later for CABG. The lesion was fixed with a bare metal stent on 12/23/2014 and he was discharged on the following day on aspirin and Effient. He has had intolerance to multiple medications including statin, beta blocker and ACE inhibitor. Interestingly, his PCP recently placed him on Lipitor and he has been tolerating okay. Since his discharge, he has failed to follow-up multiple times. He has also delayed his CABG multiple times as he has been feeling better. The last time he saw Dr. Laneta Simmers was in June at which time he agreed to undergo CABG. Apparently he was scheduled for CABG on 06/14/2015, this again was canceled once he felt better and has no further anginal symptoms.   He has since been living in assisted living facility [facility]. According to the patient, he exercises 1 mile per day and has  not had significant anginal discomfort. He was started on vitamin D yesterday on 10/24/2015 and he started having palpitation and hour and half after taking the initial medication. The palpitations lasted until 8 PM the night before resolving. This morning around 8 AM, he took another vitamin D tablet, by 9:30, his palpitation recurred along with substernal chest discomfort. He called 911 and [facility]. While in the ED, he denies this morning symptom as his  anginal equivalent. Initial troponin is negative. EKG showed T-wave inversion in inferolateral leads which has been present on the previous EKGs. Cardiology has been consulted for chest pain.    No current facility-administered medications on file prior to encounter.   Current Outpatient Prescriptions on File Prior to Encounter  Medication Sig Dispense Refill  . aspirin 81 MG tablet Take 81 mg by mouth daily.    . prasugrel (EFFIENT) 10 MG TABS tablet Take 1 tablet (10 mg total) by mouth daily. 30 tablet 11  . benazepril (LOTENSIN) 5 MG tablet Take 1 tablet (5 mg total) by mouth daily. 30 tablet 0  . meclizine (ANTIVERT) 25 MG tablet Take 1 tablet (25 mg total) by mouth 3 (three) times daily as needed for dizziness. 30 tablet 0  . nitroGLYCERIN (NITROSTAT) 0.4 MG SL tablet Place 1 tablet (0.4 mg total) under the tongue every 5 (five) minutes as needed for chest pain. 25 tablet 3  . pravastatin (PRAVACHOL) 40 MG tablet Take 1 tablet (40 mg total) by mouth every evening. 30 tablet 6      Family History Family History  Problem Relation Age of Onset  . Hypertension    . Heart attack Neg Hx   . Stroke Neg Hx   . Cancer Father      Social History Social History   Social History  . Marital Status: Single    Spouse Name: N/A  . Number of Children: 0  . Years of Education: N/A   Occupational History  . disabled     former Corporate investment bankerconstruction worker   Social History Main Topics  . Smoking status: Current Every Day Smoker -- 0.00 packs/day for 50 years    Types: Cigarettes  . Smokeless tobacco: Current User  . Alcohol Use: No  . Drug Use: No  . Sexual Activity: Not on file   Other Topics Concern  . Not on file   Social History Narrative     Review of Systems  General:  No chills, fever, night sweats or weight changes.  Cardiovascular:  No dyspnea on exertion, edema, orthopnea, paroxysmal nocturnal dyspnea. +palpitations, chest pain Dermatological: No rash,  lesions/masses Respiratory: No cough, dyspnea Urologic: No hematuria, dysuria Abdominal:   No nausea, vomiting, diarrhea, bright red blood per rectum, melena, or hematemesis Neurologic:  No visual changes, wkns, changes in mental status. All other systems reviewed and are otherwise negative except as noted above.  Physical Exam  Blood pressure 126/71, pulse 68, temperature 98.3 F (36.8 C), temperature source Oral, resp. rate 18, height 5\' 9"  (1.753 m), weight 190 lb (86.183 kg), SpO2 99 %.  General: Pleasant, NAD Psych: Normal affect. Neuro: Alert and oriented X 3. Moves all extremities spontaneously. HEENT: Normal  Neck: Supple without bruits or JVD. Lungs:  Resp regular and unlabored, CTA. Heart: RRR no s3, s4, or murmurs. Abdomen: Soft, non-tender, non-distended, BS + x 4.  Extremities: No clubbing, cyanosis or edema. DP/PT/Radials 2+ and equal bilaterally.  Labs  No results for input(s): CKTOTAL,  CKMB, TROPONINI in the last 72 hours. Lab Results  Component Value Date   WBC 7.0 10/25/2015   HGB 14.6 10/25/2015   HCT 43.4 10/25/2015   MCV 100.7* 10/25/2015   PLT 256 10/25/2015    Recent Labs Lab 10/25/15 1440  NA 140  K 4.2  CL 105  CO2 29  BUN 8  CREATININE 1.01  CALCIUM 9.1  GLUCOSE 106*   Lab Results  Component Value Date   CHOL 122 01/26/2015   HDL 31.50* 01/26/2015   LDLCALC 64 01/26/2015   TRIG 132.0 01/26/2015   Lab Results  Component Value Date   DDIMER 0.33 12/22/2014    Radiology/Studies  Dg Chest 2 View  10/25/2015  CLINICAL DATA:  [facility] in from [facility] via [facility] per report pt c/o substernal CP onset yesterday with rapid heartbeat after starting a new medication he thinks is Vitamin D, pt denies n/v/d AND SOB, hx of stent placement after MI in January EXAM: CHEST  2 VIEW COMPARISON:  05/21/2015 FINDINGS: Moderate thoracic spondylosis. Midline trachea. Normal heart size and mediastinal contours. No pleural effusion or pneumothorax. Right  apical pleural thickening is mild. Clear lungs. IMPRESSION: No acute cardiopulmonary disease. Electronically Signed   By: Jeronimo Greaves M.D.   On: 10/25/2015 15:06    ECG  NSR with TWI inferolateral leads. Unchanged compare to June 2016  ASSESSMENT AND PLAN  1. Palpitation  - Patient adamantly relieve vitamin D is causing his palpitation. He has rejected multiple medications including ACE inhibitor, statin medication and beta blocker in the past due to their side effect.  - long discussion with the patient esp given his underlying significant CAD untreated with surgery   - vit D usually does not cause palpitation. Options were given to the patient for outpatient followup vs inpatient admission, he wishes to leave  - unfortunately, he is eccentric and does not completely grasp the severity of his CAD and his long term prognosis with untreated lesions. Discussed with MD, will respect the patient's wish and arrange outpatient followup. Will continue Effient for now despite BMS and stop in Jan.   2. CAD s/p BMS to mid RCA with significant residual in LCx which also perfuse the 100% occluded LAD  - 11/2014 cath EF 25%, occluded LAD with L to L collateral, severe dx in mid LCx and large OM2, severe dx in mid and distal RCA.  - wanted to leave AMA before his scheduled CABG, eventually agreed to stay 2 more days for BMS of mid RCA lesion so he can return later for CABG  - scheduled for CABG in 05/2015, again delayed as he felt better  3. ICM with baseline EF 20-25% 4. HLD 5. tobacco abuse   Signed, Azalee Course, [place] 10/25/2015, 4:54 PM

## 2015-10-25 NOTE — Discharge Instructions (Signed)
The cause of your episode of chest pain is unclear.  You were seen by cardiology today who recommend that you follow up as directed above for reevaluation in their office.  Return with sudden, severe symptoms.  Nonspecific Chest Pain  Chest pain can be caused by many different conditions. There is always a chance that your pain could be related to something serious, such as a heart attack or a blood clot in your lungs. Chest pain can also be caused by conditions that are not life-threatening. If you have chest pain, it is very important to follow up with your health care provider. CAUSES  Chest pain can be caused by:  Heartburn.  Pneumonia or bronchitis.  Anxiety or stress.  Inflammation around your heart (pericarditis) or lung (pleuritis or pleurisy).  A blood clot in your lung.  A collapsed lung (pneumothorax). It can develop suddenly on its own (spontaneous pneumothorax) or from trauma to the chest.  Shingles infection (varicella-zoster virus).  Heart attack.  Damage to the bones, muscles, and cartilage that make up your chest wall. This can include:  Bruised bones due to injury.  Strained muscles or cartilage due to frequent or repeated coughing or overwork.  Fracture to one or more ribs.  Sore cartilage due to inflammation (costochondritis). RISK FACTORS  Risk factors for chest pain may include:  Activities that increase your risk for trauma or injury to your chest.  Respiratory infections or conditions that cause frequent coughing.  Medical conditions or overeating that can cause heartburn.  Heart disease or family history of heart disease.  Conditions or health behaviors that increase your risk of developing a blood clot.  Having had chicken pox (varicella zoster). SIGNS AND SYMPTOMS Chest pain can feel like:  Burning or tingling on the surface of your chest or deep in your chest.  Crushing, pressure, aching, or squeezing pain.  Dull or sharp pain that is  worse when you move, cough, or take a deep breath.  Pain that is also felt in your back, neck, shoulder, or arm, or pain that spreads to any of these areas. Your chest pain may come and go, or it may stay constant. DIAGNOSIS Lab tests or other studies may be needed to find the cause of your pain. Your health care provider may have you take a test called an ambulatory ECG (electrocardiogram). An ECG records your heartbeat patterns at the time the test is performed. You may also have other tests, such as:  Transthoracic echocardiogram (TTE). During echocardiography, sound waves are used to create a picture of all of the heart structures and to look at how blood flows through your heart.  Transesophageal echocardiogram (TEE).This is a more advanced imaging test that obtains images from inside your body. It allows your health care provider to see your heart in finer detail.  Cardiac monitoring. This allows your health care provider to monitor your heart rate and rhythm in real time.  Holter monitor. This is a portable device that records your heartbeat and can help to diagnose abnormal heartbeats. It allows your health care provider to track your heart activity for several days, if needed.  Stress tests. These can be done through exercise or by taking medicine that makes your heart beat more quickly.  Blood tests.  Imaging tests. TREATMENT  Your treatment depends on what is causing your chest pain. Treatment may include:  Medicines. These may include:  Acid blockers for heartburn.  Anti-inflammatory medicine.  Pain medicine for inflammatory conditions.  Antibiotic medicine, if an infection is present.  Medicines to dissolve blood clots.  Medicines to treat coronary artery disease.  Supportive care for conditions that do not require medicines. This may include:  Resting.  Applying heat or cold packs to injured areas.  Limiting activities until pain decreases. HOME CARE  INSTRUCTIONS  If you were prescribed an antibiotic medicine, finish it all even if you start to feel better.  Avoid any activities that bring on chest pain.  Do not use any tobacco products, including cigarettes, chewing tobacco, or electronic cigarettes. If you need help quitting, ask your health care provider.  Do not drink alcohol.  Take medicines only as directed by your health care provider.  Keep all follow-up visits as directed by your health care provider. This is important. This includes any further testing if your chest pain does not go away.  If heartburn is the cause for your chest pain, you may be told to keep your head raised (elevated) while sleeping. This reduces the chance that acid will go from your stomach into your esophagus.  Make lifestyle changes as directed by your health care provider. These may include:  Getting regular exercise. Ask your health care provider to suggest some activities that are safe for you.  Eating a heart-healthy diet. A registered dietitian can help you to learn healthy eating options.  Maintaining a healthy weight.  Managing diabetes, if necessary.  Reducing stress. SEEK MEDICAL CARE IF:  Your chest pain does not go away after treatment.  You have a rash with blisters on your chest.  You have a fever. SEEK IMMEDIATE MEDICAL CARE IF:   Your chest pain is worse.  You have an increasing cough, or you cough up blood.  You have severe abdominal pain.  You have severe weakness.  You faint.  You have chills.  You have sudden, unexplained chest discomfort.  You have sudden, unexplained discomfort in your arms, back, neck, or jaw.  You have shortness of breath at any time.  You suddenly start to sweat, or your skin gets clammy.  You feel nauseous or you vomit.  You suddenly feel light-headed or dizzy.  Your heart begins to beat quickly, or it feels like it is skipping beats. These symptoms may represent a serious  problem that is an emergency. Do not wait to see if the symptoms will go away. Get medical help right away. Call your local emergency services (911 in the [place] Do not drive yourself to the hospital.   This information is not intended to replace advice given to you by your health care provider. Make sure you discuss any questions you have with your health care provider.   Document Released: 08/23/2005 Document Revised: 12/04/2014 Document Reviewed: 06/19/2014 Elsevier Interactive Patient Education [employer]

## 2015-10-25 NOTE — ED Provider Notes (Signed)
CSN: [id number]     Arrival date & time 10/25/15  1452 History   First MD Initiated Contact with Patient 10/25/15 1456     Chief Complaint  Patient presents with  . Chest Pain    HPI  Wilfred CurtisLarry Trudo is a 64 y.o. M PMH significant for CAD, NSTEMI (cath 12/22/14) presenting with a 2 day history of palpitations and one episode of chest pain today. He states his chest pain and palpitations started right after being given a vitamin D supplement. His chest pain is midsternal, non-radiating, 5/10 pain scale, dull and achy, and not similar to his NSTEMI chest pain. He denies fevers, chills, SOB, abdominal pain, nausea, vomiting, change in bowel/bladder habits.   Echo June 2016 EF 20-25%   Past Medical History  Diagnosis Date  . Osteoarthritis   . Coronary artery disease     a. Big NSTEMI 11/2014: cath with surgical disease but patient initially refused CABG. Compromise decision was made to do intervention on the RCA with a bare metal stent with medical therapy for other disease and follow-up as an outpatient.  . Myocardial infarction [facility](HCC)     NSTEMI  . HOH (hard of hearing)   . Tobacco abuse   . Ischemic cardiomyopathy     a. a. Cath 12/22/14: EF 25%. b. 2D Echo 12/23/14: EF 25-30%, diffuse hypokinesis, akinesis of the entireinferolateral and inferior myocardium, mild MR.  [name]. Hyperlipidemia   . Transaminitis    Past Surgical History  Procedure Laterality Date  . Left heart catheterization with coronary angiogram N/A 12/22/2014    Procedure: LEFT HEART CATHETERIZATION WITH CORONARY ANGIOGRAM;  Surgeon: Corky CraftsJayadeep S Varanasi, MD; LAD 100%, CFX 90%, RI mild dz, RCA 95%/70%, EF 25%, CABG recommended  . Percutaneous coronary stent intervention (pci-s) N/A 12/23/2014    Procedure: PERCUTANEOUS CORONARY STENT INTERVENTION (PCI-S);  Surgeon: Corky CraftsJayadeep S Varanasi, MD; 3.0 x 24 rebel BMS to the RCA    Family History  Problem Relation Age of Onset  . Hypertension    . Heart attack Neg Hx   . Stroke Neg Hx    . Cancer Father    Social History  Substance Use Topics  . Smoking status: Current Every Day Smoker -- 0.00 packs/day for 50 years    Types: Cigarettes  . Smokeless tobacco: Current User  . Alcohol Use: No    Review of Systems  Ten systems are reviewed and are negative for acute change except as noted in the HPI   Allergies  Penicillins  Home Medications   Prior to Admission medications   Medication Sig Start Date End Date Taking? Authorizing Provider  aspirin 81 MG tablet Take 81 mg by mouth daily.    Historical Provider, MD  benazepril (LOTENSIN) 5 MG tablet Take 1 tablet (5 mg total) by mouth daily. 05/28/15   Dione Boozeavid Glick, MD  meclizine (ANTIVERT) 25 MG tablet Take 1 tablet (25 mg total) by mouth 3 (three) times daily as needed for dizziness. 05/28/15   Dione Boozeavid Glick, MD  nitroGLYCERIN (NITROSTAT) 0.4 MG SL tablet Place 1 tablet (0.4 mg total) under the tongue every 5 (five) minutes as needed for chest pain. 12/24/14   Rhonda G Barrett, PA-C  prasugrel (EFFIENT) 10 MG TABS tablet Take 1 tablet (10 mg total) by mouth daily. 12/24/14   Rhonda G Barrett, PA-C  pravastatin (PRAVACHOL) 40 MG tablet Take 1 tablet (40 mg total) by mouth every evening. 06/14/15   Peter M SwazilandJordan, MD   BP 112/60 mmHg  Pulse 59  Temp(Src) 98.3 F (36.8 C) (Oral)  Resp 23  Ht  (1.753 m)  Wt 86.183 kg  BMI 28.05 kg/m2  SpO2 99% Physical Exam  Constitutional: He appears well-developed and well-nourished. No distress.  HENT:  Head: Normocephalic and atraumatic.  Mouth/Throat: Oropharynx is clear and moist. No oropharyngeal exudate.  Eyes: Conjunctivae are normal. Pupils are equal, round, and reactive to light. Right eye exhibits no discharge. Left eye exhibits no discharge. No scleral icterus.  Neck: No tracheal deviation present.  Cardiovascular: Normal rate, regular rhythm, normal heart sounds and intact distal pulses.  Exam reveals no gallop and no friction rub.   No murmur  heard. Pulmonary/Chest: Effort normal and breath sounds normal. No respiratory distress. He has no wheezes. He has no rales. He exhibits no tenderness.  Abdominal: Soft. Bowel sounds are normal. He exhibits no distension and no mass. There is no tenderness. There is no rebound and no guarding.  Musculoskeletal: He exhibits no edema.  Lymphadenopathy:    He has no cervical adenopathy.  Neurological: He is alert. Coordination normal.  Skin: Skin is warm and dry. No rash noted. He is not diaphoretic. No erythema.  Psychiatric: He has a normal mood and affect. His behavior is normal.  Nursing note and vitals reviewed.   ED Course  Procedures  Labs Review Labs Reviewed  BASIC METABOLIC PANEL - Abnormal; Notable for the following:    Glucose, Bld 106 (*)    All other components within normal limits  CBC - Abnormal; Notable for the following:    MCV 100.7 (*)    All other components within normal limits  I-STAT TROPOININ, ED    Imaging Review Dg Chest 2 View  10/25/2015  CLINICAL DATA:  Pt in from [facility] via [facility] per report pt c/o substernal CP onset yesterday with rapid heartbeat after starting a new medication he thinks is Vitamin D, pt denies n/v/d AND SOB, hx of stent placement after MI in January EXAM: CHEST  2 VIEW COMPARISON:  05/21/2015 FINDINGS: Moderate thoracic spondylosis. Midline trachea. Normal heart size and mediastinal contours. No pleural effusion or pneumothorax. Right apical pleural thickening is mild. Clear lungs. IMPRESSION: No acute cardiopulmonary disease. Electronically Signed   By: Jeronimo Greaves M.D.   On: 10/25/2015 15:06   I have personally reviewed and evaluated these images and lab results as part of my medical decision-making.   EKG Interpretation   Date/Time:  Monday October 25 2015 14:19:42 EST Ventricular Rate:  67 PR Interval:  124 QRS Duration: 114 QT Interval:  456 QTC Calculation: 481 R Axis:   -50 Text Interpretation:  Normal sinus  rhythm Left axis deviation Septal  infarct , age undetermined ST \\T \ T wave abnormality, consider lateral  ischemia Abnormal ECG Since last tracing ST changes are more dramatic than  on prior. Confirmed by Hyacinth Meeker  MD, BRIAN ([id number]) on 10/25/2015 2:55:20 PM      MDM   Final diagnoses:  Chest pain, unspecified chest pain type   Patient non-toxic appearing, VSS. Based on patient history and physical exam, most likely etiologies include ACS, CHF/acute pulmonary edema, MSK, anxiety. Less likely etiologies include Prinzmetal's/cocaine-induced angina, pericarditis/pericardial effusion, cardiac tamponade, constrictive pericarditis, myocarditis, aortic dissection, thoracic aortic aneurysm, pneumonia, pleuritis, pneumothorax, tension pneumothorax, pulmonary embolism, pulmonary HTN, GERD, esophageal spasm, Mallory-Weiss tear, Boerhaave syndrome, peptic ulcer diease, biliary disease, pancreatitis, osteoarthritis/radiculopathy, herpes zoster, sickle cell chest crisis.  ST changes on EKG more pronounced vs last tracing. CXR negative,  BMP, troponin unremarkable.  Based on patient's significant risk for cardiac events, Dr. Cyndie Chime called cardiology consult, who advised discharge and OP follow-up. Patient may be safely discharged. Discussed reasons for return. Patient to follow-up with cardiology as OP. Patient in understanding and agreement with the plan.  Melton Krebs, PA-C 11/04/15 2001  Leta Baptist, MD 11/06/15 2119

## 2015-10-25 NOTE — ED Notes (Signed)
Pt awaiting [facility] for transport. 

## 2015-10-25 NOTE — ED Notes (Signed)
Pt in [facility] via [facility] per report pt c/o substernal CP onset yesterday, 324 mg ASA pta & x 1 sL nitro, pain went from 5/10 to 2/10, pt denies n/v/d & SOB, hx of stent placement, A&O x4

## 2015-10-31 ENCOUNTER — Encounter (HOSPITAL_COMMUNITY): Payer: Self-pay | Admitting: *Deleted

## 2015-10-31 ENCOUNTER — Emergency Department (HOSPITAL_COMMUNITY)
Admission: EM | Admit: 2015-10-31 | Discharge: 2015-10-31 | Disposition: A | Discharge status: Home / Self Care | Payer: Medicare Other | Attending: Emergency Medicine | Admitting: Emergency Medicine

## 2015-10-31 ENCOUNTER — Emergency Department (HOSPITAL_COMMUNITY): Payer: Medicare Other

## 2015-10-31 DIAGNOSIS — E785 Hyperlipidemia, unspecified: Secondary | ICD-10-CM | POA: Diagnosis not present

## 2015-10-31 DIAGNOSIS — Z9889 Other specified postprocedural states: Secondary | ICD-10-CM | POA: Diagnosis not present

## 2015-10-31 DIAGNOSIS — Z9861 Coronary angioplasty status: Secondary | ICD-10-CM | POA: Insufficient documentation

## 2015-10-31 DIAGNOSIS — Z7902 Long term (current) use of antithrombotics/antiplatelets: Secondary | ICD-10-CM | POA: Diagnosis not present

## 2015-10-31 DIAGNOSIS — R002 Palpitations: Secondary | ICD-10-CM | POA: Diagnosis not present

## 2015-10-31 DIAGNOSIS — Z79899 Other long term (current) drug therapy: Secondary | ICD-10-CM | POA: Insufficient documentation

## 2015-10-31 DIAGNOSIS — R079 Chest pain, unspecified: Secondary | ICD-10-CM | POA: Diagnosis not present

## 2015-10-31 DIAGNOSIS — M1990 Unspecified osteoarthritis, unspecified site: Secondary | ICD-10-CM | POA: Insufficient documentation

## 2015-10-31 DIAGNOSIS — Z7982 Long term (current) use of aspirin: Secondary | ICD-10-CM | POA: Diagnosis not present

## 2015-10-31 DIAGNOSIS — Z880 Allergy status to penicillin: Secondary | ICD-10-CM | POA: Diagnosis not present

## 2015-10-31 DIAGNOSIS — H9190 Unspecified hearing loss, unspecified ear: Secondary | ICD-10-CM | POA: Insufficient documentation

## 2015-10-31 DIAGNOSIS — F439 Reaction to severe stress, unspecified: Secondary | ICD-10-CM | POA: Diagnosis not present

## 2015-10-31 DIAGNOSIS — F17210 Nicotine dependence, cigarettes, uncomplicated: Secondary | ICD-10-CM | POA: Insufficient documentation

## 2015-10-31 DIAGNOSIS — I2510 Atherosclerotic heart disease of native coronary artery without angina pectoris: Secondary | ICD-10-CM | POA: Diagnosis not present

## 2015-10-31 DIAGNOSIS — Z792 Long term (current) use of antibiotics: Secondary | ICD-10-CM | POA: Diagnosis not present

## 2015-10-31 DIAGNOSIS — I252 Old myocardial infarction: Secondary | ICD-10-CM | POA: Diagnosis not present

## 2015-10-31 LAB — BASIC METABOLIC PANEL
Anion gap: 8 (ref 5–15)
BUN: 9 mg/dL (ref 6–20)
CHLORIDE: 104 mmol/L (ref 101–111)
CO2: 27 mmol/L (ref 22–32)
CREATININE: 0.9 mg/dL (ref 0.61–1.24)
Calcium: 8.9 mg/dL (ref 8.9–10.3)
GFR calc non Af Amer: >60 mL/min (ref >60)
Glucose, Bld: 82 mg/dL (ref 65–99)
Potassium: 4.1 mmol/L (ref 3.5–5.1)
Sodium: 139 mmol/L (ref 135–145)

## 2015-10-31 LAB — CBC
HCT: 44.5 % (ref 39.0–52.0)
Hemoglobin: 14.8 g/dL (ref 13.0–17.0)
MCH: 33.6 pg (ref 26.0–34.0)
MCHC: 33.3 g/dL (ref 30.0–36.0)
MCV: 101.1 fL — AB (ref 78.0–100.0)
PLATELETS: 232 10*3/uL (ref 150–400)
RBC: 4.4 MIL/uL (ref 4.22–5.81)
RDW: 13.1 % (ref 11.5–15.5)
WBC: 8 10*3/uL (ref 4.0–10.5)

## 2015-10-31 LAB — I-STAT TROPONIN, ED: Troponin i, poc: 0.01 ng/mL (ref 0.00–0.08)

## 2015-10-31 MED ORDER — CLOPIDOGREL BISULFATE 75 MG PO TABS: 75.0000 mg | ORAL_TABLET | Freq: Every day | ORAL | Status: DC

## 2015-10-31 MED ORDER — PRASUGREL HCL 10 MG PO TABS
10.0000 mg | ORAL_TABLET | Freq: Once | ORAL | Status: AC
  Administered 2015-10-31: 10 mg via ORAL
  Filled 2015-10-31: qty 1

## 2015-10-31 MED ORDER — GUAIFENESIN-CODEINE 100-10 MG/5ML PO SOLN: 10.0000 mL | Freq: Four times a day (QID) | ORAL | Status: DC | PRN

## 2015-10-31 MED ORDER — ASPIRIN EC 81 MG PO TBEC
81.0000 mg | DELAYED_RELEASE_TABLET | Freq: Once | ORAL | Status: AC
  Administered 2015-10-31: 81 mg via ORAL
  Filled 2015-10-31: qty 1

## 2015-10-31 NOTE — ED Provider Notes (Signed)
CSN: [id number]     Arrival date & time 10/31/15  1054 History   First MD Initiated Contact with Patient 10/31/15 1154     Chief Complaint  Patient presents with  . Chest Pain  . Palpitations  . Stress     (Consider location/radiation/quality/duration/timing/severity/associated sxs/prior Treatment) HPI Comments: Patient is a 64 year old male with history of coronary artery disease with stent placement in January of this year. He presents with complaints of a tingling in his chest that has been ongoing for the past 2 days. He reports losing his Effient medication after he filled his prescription and is very stressed about this. He is concerned he is going to form a blood clot in his stent and this has him very anxious. He denies any shortness of breath, diaphoresis, nausea, or radiation to the arm or jaw.  Patient is a 64 y.o. male presenting with chest pain and palpitations. The history is provided by the patient.  Chest Pain Pain location:  Substernal area Pain quality comment:  Tingling Pain radiates to:  Does not radiate Pain radiates to the back: no   Pain severity:  Mild Duration:  3 days Timing:  Constant Progression:  Unchanged Chronicity:  New Associated symptoms: palpitations   Palpitations Associated symptoms: chest pain     Past Medical History  Diagnosis Date  . Osteoarthritis   . Coronary artery disease     a. Big NSTEMI 11/2014: cath with surgical disease but patient initially refused CABG. Compromise decision was made to do intervention on the RCA with a bare metal stent with medical therapy for other disease and follow-up as an outpatient.  . Myocardial infarction [facility](HCC)     NSTEMI  . HOH (hard of hearing)   . Tobacco abuse   . Ischemic cardiomyopathy     a. a. Cath 12/22/14: EF 25%. b. 2D Echo 12/23/14: EF 25-30%, diffuse hypokinesis, akinesis of the entireinferolateral and inferior myocardium, mild MR.  [name]. Hyperlipidemia   . Transaminitis    Past Surgical  History  Procedure Laterality Date  . Left heart catheterization with coronary angiogram N/A 12/22/2014    Procedure: LEFT HEART CATHETERIZATION WITH CORONARY ANGIOGRAM;  Surgeon: Corky CraftsJayadeep S Varanasi, MD; LAD 100%, CFX 90%, RI mild dz, RCA 95%/70%, EF 25%, CABG recommended  . Percutaneous coronary stent intervention (pci-s) N/A 12/23/2014    Procedure: PERCUTANEOUS CORONARY STENT INTERVENTION (PCI-S);  Surgeon: Corky CraftsJayadeep S Varanasi, MD; 3.0 x 24 rebel BMS to the RCA    Family History  Problem Relation Age of Onset  . Hypertension    . Heart attack Neg Hx   . Stroke Neg Hx   . Cancer Father    Social History  Substance Use Topics  . Smoking status: Current Every Day Smoker -- 0.00 packs/day for 50 years    Types: Cigarettes  . Smokeless tobacco: Current User  . Alcohol Use: No    Review of Systems  Cardiovascular: Positive for chest pain and palpitations.  All other systems reviewed and are negative.     Allergies  Penicillins  Home Medications   Prior to Admission medications   Medication Sig Start Date End Date Taking? Authorizing Provider  aspirin 81 MG tablet Take 81 mg by mouth daily.    Historical Provider, MD  atorvastatin (LIPITOR) 20 MG tablet Take 20 mg by mouth every morning.    Historical Provider, MD  benazepril (LOTENSIN) 5 MG tablet Take 1 tablet (5 mg total) by mouth daily. 05/28/15   Dione Boozeavid Glick,  MD  Cholecalciferol 2000 UNITS TABS Take 2,000 Units by mouth daily.    Historical Provider, MD  levofloxacin (LEVAQUIN) 750 MG tablet Take 750 mg by mouth daily.    Historical Provider, MD  meclizine (ANTIVERT) 25 MG tablet Take 1 tablet (25 mg total) by mouth 3 (three) times daily as needed for dizziness. 05/28/15   Dione Booze, MD  nitroGLYCERIN (NITROSTAT) 0.4 MG SL tablet Place 1 tablet (0.4 mg total) under the tongue every 5 (five) minutes as needed for chest pain. 12/24/14   Rhonda G Barrett, PA-C  prasugrel (EFFIENT) 10 MG TABS tablet Take 1 tablet (10 mg total) by  mouth daily. 12/24/14   Rhonda G Barrett, PA-C  pravastatin (PRAVACHOL) 40 MG tablet Take 1 tablet (40 mg total) by mouth every evening. 06/14/15   Peter M Swazil[place], MD  traMADol (ULTRAM) 50 MG tablet Take 50 mg by mouth every 6 (six) hours as needed.    Historical Provider, MD   BP 132/74 mmHg  Pulse 87  Temp(Src) 98.2 F (36.8 C) (Oral)  Resp 20  SpO2 99% Physical Exam  Constitutional: He is oriented to person, place, and time. He appears well-developed and well-nourished. No distress.  HENT:  Head: Normocephalic and atraumatic.  Neck: Normal range of motion. Neck supple.  Cardiovascular: Normal rate, regular rhythm and normal heart sounds.   No murmur heard. Pulmonary/Chest: Effort normal and breath sounds normal. No respiratory distress. He has no wheezes.  Abdominal: Soft. Bowel sounds are normal. He exhibits no distension. There is no tenderness.  Musculoskeletal: Normal range of motion. He exhibits no edema.  Lymphadenopathy:    He has no cervical adenopathy.  Neurological: He is alert and oriented to person, place, and time.  Skin: Skin is warm and dry. He is not diaphoretic.  Nursing note and vitals reviewed.   ED Course  Procedures (including critical care time) Labs Review Labs Reviewed  CBC - Abnormal; Notable for the following:    MCV 101.1 (*)    All other components within normal limits  BASIC METABOLIC PANEL  I-STAT TROPOININ, ED    Imaging Review No results found. I have personally reviewed and evaluated these images and lab results as part of my medical decision-making.   EKG Interpretation   Date/Time:  Sunday October 31 2015 11:18:50 EST Ventricular Rate:  82 PR Interval:  122 QRS Duration: 110 QT Interval:  402 QTC Calculation: 469 R Axis:   -57 Text Interpretation:  Normal sinus rhythm Left axis deviation Septal  infarct , age undetermined Abnormal ECG unchanged from first prior  Confirmed by RAY MD, Duwayne Heck [id number]) on 10/31/2015 11:23:59 AM       MDM   Final diagnoses:  None    Patient presents here with complaints of chest discomfort and feeling anxious that he lost the Effient prescription he recently filled. He is concerned that his stent will clot off of the does not have this medication. He cannot afford it and his insurance will not provide him with a refill. His workup today reveals no evidence for acute coronary syndrome.  Social work has evaluated him and determined that the next best medication would be the best option. I discussed this with Dr. Johney Frame from cardiology who is recommending Plavix as a suitable alternative. He will be prescribed this and discharged to home. He will follow-up with his cardiologist in the upcoming weeks for a recheck and return if he worsens.    Geoffery Lyons, MD 10/31/15 [phone]

## 2015-10-31 NOTE — Progress Notes (Signed)
CSW received consult to speak with patient regarding medications, however, this is a case management referral. RNCM Jeannie informed. CSW to sign off. Please consult if further CSW needs arise.   Fernande BoydenJoyce Alaiya Martindelcampo, LCSWA Clinical Social Worker [facility] Ph: [phone]

## 2015-10-31 NOTE — Progress Notes (Signed)
CM consulted for medication assistance. Interviewed pt who reports he has Stent placed in January 2016 and was placed on Effient as a blood Thinner. Had 30 day Rx filled Thursday 10/28/2015 and lost bottle while riding Public bus. EDV to obtain another Rx or alternative. Unable to provide 30 day coupon as pt used this in January with original Rx. Spoke with MD in [facility], who consulted with Cardiology. Plavix is a substitute which he can prescribe for 30 days until pt can have Effient refilled using his Medicare. No further CM needs. Will be available should further discharge needs arise.

## 2015-10-31 NOTE — ED Notes (Signed)
MD at bedside. 

## 2015-10-31 NOTE — Discharge Instructions (Signed)
Take Plavix as prescribed today.  Follow-up with your cardiologist in the next week for a recheck.   Nonspecific Chest Pain  Chest pain can be caused by many different conditions. There is always a chance that your pain could be related to something serious, such as a heart attack or a blood clot in your lungs. Chest pain can also be caused by conditions that are not life-threatening. If you have chest pain, it is very important to follow up with your health care provider. CAUSES  Chest pain can be caused by:  Heartburn.  Pneumonia or bronchitis.  Anxiety or stress.  Inflammation around your heart (pericarditis) or lung (pleuritis or pleurisy).  A blood clot in your lung.  A collapsed lung (pneumothorax). It can develop suddenly on its own (spontaneous pneumothorax) or from trauma to the chest.  Shingles infection (varicella-zoster virus).  Heart attack.  Damage to the bones, muscles, and cartilage that make up your chest wall. This can include:  Bruised bones due to injury.  Strained muscles or cartilage due to frequent or repeated coughing or overwork.  Fracture to one or more ribs.  Sore cartilage due to inflammation (costochondritis). RISK FACTORS  Risk factors for chest pain may include:  Activities that increase your risk for trauma or injury to your chest.  Respiratory infections or conditions that cause frequent coughing.  Medical conditions or overeating that can cause heartburn.  Heart disease or family history of heart disease.  Conditions or health behaviors that increase your risk of developing a blood clot.  Having had chicken pox (varicella zoster). SIGNS AND SYMPTOMS Chest pain can feel like:  Burning or tingling on the surface of your chest or deep in your chest.  Crushing, pressure, aching, or squeezing pain.  Dull or sharp pain that is worse when you move, cough, or take a deep breath.  Pain that is also felt in your back, neck, shoulder,  or arm, or pain that spreads to any of these areas. Your chest pain may come and go, or it may stay constant. DIAGNOSIS Lab tests or other studies may be needed to find the cause of your pain. Your health care provider may have you take a test called an ambulatory ECG (electrocardiogram). An ECG records your heartbeat patterns at the time the test is performed. You may also have other tests, such as:  Transthoracic echocardiogram (TTE). During echocardiography, sound waves are used to create a picture of all of the heart structures and to look at how blood flows through your heart.  Transesophageal echocardiogram (TEE).This is a more advanced imaging test that obtains images from inside your body. It allows your health care provider to see your heart in finer detail.  Cardiac monitoring. This allows your health care provider to monitor your heart rate and rhythm in real time.  Holter monitor. This is a portable device that records your heartbeat and can help to diagnose abnormal heartbeats. It allows your health care provider to track your heart activity for several days, if needed.  Stress tests. These can be done through exercise or by taking medicine that makes your heart beat more quickly.  Blood tests.  Imaging tests. TREATMENT  Your treatment depends on what is causing your chest pain. Treatment may include:  Medicines. These may include:  Acid blockers for heartburn.  Anti-inflammatory medicine.  Pain medicine for inflammatory conditions.  Antibiotic medicine, if an infection is present.  Medicines to dissolve blood clots.  Medicines to treat coronary artery  disease.  Supportive care for conditions that do not require medicines. This may include:  Resting.  Applying heat or cold packs to injured areas.  Limiting activities until pain decreases. HOME CARE INSTRUCTIONS  If you were prescribed an antibiotic medicine, finish it all even if you start to feel  better.  Avoid any activities that bring on chest pain.  Do not use any tobacco products, including cigarettes, chewing tobacco, or electronic cigarettes. If you need help quitting, ask your health care provider.  Do not drink alcohol.  Take medicines only as directed by your health care provider.  Keep all follow-up visits as directed by your health care provider. This is important. This includes any further testing if your chest pain does not go away.  If heartburn is the cause for your chest pain, you may be told to keep your head raised (elevated) while sleeping. This reduces the chance that acid will go from your stomach into your esophagus.  Make lifestyle changes as directed by your health care provider. These may include:  Getting regular exercise. Ask your health care provider to suggest some activities that are safe for you.  Eating a heart-healthy diet. A registered dietitian can help you to learn healthy eating options.  Maintaining a healthy weight.  Managing diabetes, if necessary.  Reducing stress. SEEK MEDICAL CARE IF:  Your chest pain does not go away after treatment.  You have a rash with blisters on your chest.  You have a fever. SEEK IMMEDIATE MEDICAL CARE IF:   Your chest pain is worse.  You have an increasing cough, or you cough up blood.  You have severe abdominal pain.  You have severe weakness.  You faint.  You have chills.  You have sudden, unexplained chest discomfort.  You have sudden, unexplained discomfort in your arms, back, neck, or jaw.  You have shortness of breath at any time.  You suddenly start to sweat, or your skin gets clammy.  You feel nauseous or you vomit.  You suddenly feel light-headed or dizzy.  Your heart begins to beat quickly, or it feels like it is skipping beats. These symptoms may represent a serious problem that is an emergency. Do not wait to see if the symptoms will go away. Get medical help right away.  Call your local emergency services (911 in the [place] Do not drive yourself to the hospital.   This information is not intended to replace advice given to you by your health care provider. Make sure you discuss any questions you have with your health care provider.   Document Released: 08/23/2005 Document Revised: 12/04/2014 Document Reviewed: 06/19/2014 Elsevier Interactive Patient Education [employer].

## 2015-10-31 NOTE — ED Notes (Signed)
Pt reports loosing his effient on the bus recently, has been without x 3 days. Now has mild chest discomfort which he states is anxiety related and mild palpitations. No acute distress noted.

## 2015-11-03 ENCOUNTER — Emergency Department (HOSPITAL_COMMUNITY): Payer: Medicare Other

## 2015-11-03 ENCOUNTER — Encounter (HOSPITAL_COMMUNITY): Payer: Self-pay | Admitting: Emergency Medicine

## 2015-11-03 DIAGNOSIS — Z7952 Long term (current) use of systemic steroids: Secondary | ICD-10-CM | POA: Insufficient documentation

## 2015-11-03 DIAGNOSIS — Z880 Allergy status to penicillin: Secondary | ICD-10-CM | POA: Diagnosis not present

## 2015-11-03 DIAGNOSIS — Z7982 Long term (current) use of aspirin: Secondary | ICD-10-CM | POA: Insufficient documentation

## 2015-11-03 DIAGNOSIS — E785 Hyperlipidemia, unspecified: Secondary | ICD-10-CM | POA: Diagnosis not present

## 2015-11-03 DIAGNOSIS — Z79899 Other long term (current) drug therapy: Secondary | ICD-10-CM | POA: Diagnosis not present

## 2015-11-03 DIAGNOSIS — M1990 Unspecified osteoarthritis, unspecified site: Secondary | ICD-10-CM | POA: Insufficient documentation

## 2015-11-03 DIAGNOSIS — H9190 Unspecified hearing loss, unspecified ear: Secondary | ICD-10-CM | POA: Insufficient documentation

## 2015-11-03 DIAGNOSIS — J441 Chronic obstructive pulmonary disease with (acute) exacerbation: Secondary | ICD-10-CM | POA: Insufficient documentation

## 2015-11-03 DIAGNOSIS — I2510 Atherosclerotic heart disease of native coronary artery without angina pectoris: Secondary | ICD-10-CM | POA: Insufficient documentation

## 2015-11-03 DIAGNOSIS — I252 Old myocardial infarction: Secondary | ICD-10-CM | POA: Diagnosis not present

## 2015-11-03 DIAGNOSIS — F17210 Nicotine dependence, cigarettes, uncomplicated: Secondary | ICD-10-CM | POA: Insufficient documentation

## 2015-11-03 DIAGNOSIS — Z7902 Long term (current) use of antithrombotics/antiplatelets: Secondary | ICD-10-CM | POA: Diagnosis not present

## 2015-11-03 DIAGNOSIS — R079 Chest pain, unspecified: Secondary | ICD-10-CM | POA: Diagnosis present

## 2015-11-03 NOTE — ED Notes (Signed)
Pt. reports intermittent central chest " dull" pain onset this evening with productive cough and chest congestion , denies SOB , nausea or diaphoresis .

## 2015-11-04 ENCOUNTER — Emergency Department (HOSPITAL_COMMUNITY)
Admission: EM | Admit: 2015-11-04 | Discharge: 2015-11-04 | Disposition: A | Discharge status: Home / Self Care | Payer: Medicare Other | Attending: Emergency Medicine | Admitting: Emergency Medicine

## 2015-11-04 DIAGNOSIS — J441 Chronic obstructive pulmonary disease with (acute) exacerbation: Secondary | ICD-10-CM

## 2015-11-04 LAB — CBC
HEMATOCRIT: 43.4 % (ref 39.0–52.0)
Hemoglobin: 14.4 g/dL (ref 13.0–17.0)
MCH: 33.6 pg (ref 26.0–34.0)
MCHC: 33.2 g/dL (ref 30.0–36.0)
MCV: 101.4 fL — ABNORMAL HIGH (ref 78.0–100.0)
Platelets: 219 10*3/uL (ref 150–400)
RBC: 4.28 MIL/uL (ref 4.22–5.81)
RDW: 13.4 % (ref 11.5–15.5)
WBC: 8.7 10*3/uL (ref 4.0–10.5)

## 2015-11-04 LAB — BASIC METABOLIC PANEL
Anion gap: 7 (ref 5–15)
BUN: 6 mg/dL (ref 6–20)
CALCIUM: 9.4 mg/dL (ref 8.9–10.3)
CO2: 29 mmol/L (ref 22–32)
Chloride: 102 mmol/L (ref 101–111)
Creatinine, Ser: 0.93 mg/dL (ref 0.61–1.24)
GFR calc Af Amer: >60 mL/min (ref >60)
GLUCOSE: 179 mg/dL — AB (ref 65–99)
POTASSIUM: 3.9 mmol/L (ref 3.5–5.1)
Sodium: 138 mmol/L (ref 135–145)

## 2015-11-04 LAB — I-STAT TROPONIN, ED: Troponin i, poc: 0 ng/mL (ref 0.00–0.08)

## 2015-11-04 MED ORDER — DOXYCYCLINE HYCLATE 100 MG PO CAPS: 100.0000 mg | ORAL_CAPSULE | Freq: Two times a day (BID) | ORAL | Status: DC

## 2015-11-04 MED ORDER — ALBUTEROL SULFATE HFA 108 (90 BASE) MCG/ACT IN AERS: 2.0000 | INHALATION_SPRAY | RESPIRATORY_TRACT | Status: DC | PRN

## 2015-11-04 MED ORDER — PREDNISONE 20 MG PO TABS
60.0000 mg | ORAL_TABLET | Freq: Once | ORAL | Status: AC
  Administered 2015-11-04: 60 mg via ORAL
  Filled 2015-11-04: qty 3

## 2015-11-04 MED ORDER — IPRATROPIUM-ALBUTEROL 0.5-2.5 (3) MG/3ML IN SOLN
3.0000 mL | Freq: Once | RESPIRATORY_TRACT | Status: AC
  Administered 2015-11-04: 3 mL via RESPIRATORY_TRACT
  Filled 2015-11-04: qty 3

## 2015-11-04 MED ORDER — PREDNISONE 20 MG PO TABS: 60.0000 mg | ORAL_TABLET | Freq: Every day | ORAL | Status: DC

## 2015-11-04 NOTE — ED Provider Notes (Signed)
CSN: [id number]     Arrival date & time 11/03/15  2311 History  By signing my name below, I, Phillis HaggisGabriella Gaje, attest that this documentation has been prepared under the direction and in the presence of Shon Batonourtney F Tanner Yeley, MD. Electronically Signed: Phillis HaggisGabriella Gaje, ED Scribe. 11/04/2015. 1:39 AM.   Chief Complaint  Patient presents with  . Chest Pain   The history is provided by the patient. No language interpreter was used.   HPI Comments: Casey Jackson is a 64 y.o. Male with a hx of MI, stent placement 11/2014, and CAD who presents to the Emergency Department complaining of gradually resolving, intermittent, dull, central chest pain that he rates 1/10 onset earlier this evening. Pt reports associated productive cough and chest congestion. Pt reports that he was evicted from his apartment yesterday and has been trying to stay at a shelter. He states that his being out in the rainy weather exacerbated his cough. Pt reports that he used to smoke 1.5 ppd, but is now only smoking 3 cigarettes a day. He denies hx of COPD, fever, diaphoresis, SOB, or nausea. Pt is requesting paperwork about his condition to give to the shelter he is hoping to stay at.   Past Medical History  Diagnosis Date  . Osteoarthritis   . Coronary artery disease     a. Big NSTEMI 11/2014: cath with surgical disease but patient initially refused CABG. Compromise decision was made to do intervention on the RCA with a bare metal stent with medical therapy for other disease and follow-up as an outpatient.  . Myocardial infarction [facility](HCC)     NSTEMI  . HOH (hard of hearing)   . Tobacco abuse   . Ischemic cardiomyopathy     a. a. Cath 12/22/14: EF 25%. b. 2D Echo 12/23/14: EF 25-30%, diffuse hypokinesis, akinesis of the entireinferolateral and inferior myocardium, mild MR.  [name]. Hyperlipidemia   . Transaminitis    Past Surgical History  Procedure Laterality Date  . Left heart catheterization with coronary angiogram N/A 12/22/2014     Procedure: LEFT HEART CATHETERIZATION WITH CORONARY ANGIOGRAM;  Surgeon: Corky CraftsJayadeep S Varanasi, MD; LAD 100%, CFX 90%, RI mild dz, RCA 95%/70%, EF 25%, CABG recommended  . Percutaneous coronary stent intervention (pci-s) N/A 12/23/2014    Procedure: PERCUTANEOUS CORONARY STENT INTERVENTION (PCI-S);  Surgeon: Corky CraftsJayadeep S Varanasi, MD; 3.0 x 24 rebel BMS to the RCA    Family History  Problem Relation Age of Onset  . Hypertension    . Heart attack Neg Hx   . Stroke Neg Hx   . Cancer Father    Social History  Substance Use Topics  . Smoking status: Current Every Day Smoker -- 0.00 packs/day for 50 years    Types: Cigarettes  . Smokeless tobacco: Current User  . Alcohol Use: No    Review of Systems  Constitutional: Negative for diaphoresis.  HENT: Positive for congestion.   Respiratory: Positive for cough. Negative for shortness of breath.   Cardiovascular: Positive for chest pain.  Gastrointestinal: Negative for nausea.  All other systems reviewed and are negative.  Allergies  Penicillins and Ergocalciferol  Home Medications   Prior to Admission medications   Medication Sig Start Date End Date Taking? Authorizing Provider  aspirin 81 MG tablet Take 81 mg by mouth daily.   Yes Historical Provider, MD  atorvastatin (LIPITOR) 20 MG tablet Take 20 mg by mouth every morning.   Yes Historical Provider, MD  clopidogrel (PLAVIX) 75 MG tablet Take 1 tablet (75  mg total) by mouth daily. 10/31/15  Yes Geoffery Lyons, MD  prasugrel (EFFIENT) 10 MG TABS tablet Take 1 tablet (10 mg total) by mouth daily. 12/24/14  Yes Rhonda G Barrett, PA-C  albuterol (PROVENTIL HFA;VENTOLIN HFA) 108 (90 BASE) MCG/ACT inhaler Inhale 2 puffs into the lungs every 4 (four) hours as needed for wheezing or shortness of breath. 11/04/15   Shon Baton, MD  benazepril (LOTENSIN) 5 MG tablet Take 1 tablet (5 mg total) by mouth daily. Patient not taking: Reported on 11/04/2015 05/28/15   Dione Booze, MD  doxycycline  (VIBRAMYCIN) 100 MG capsule Take 1 capsule (100 mg total) by mouth 2 (two) times daily. 11/04/15   Shon Baton, MD  meclizine (ANTIVERT) 25 MG tablet Take 1 tablet (25 mg total) by mouth 3 (three) times daily as needed for dizziness. Patient not taking: Reported on 11/04/2015 05/28/15   Dione Booze, MD  nitroGLYCERIN (NITROSTAT) 0.4 MG SL tablet Place 1 tablet (0.4 mg total) under the tongue every 5 (five) minutes as needed for chest pain. Patient not taking: Reported on 11/04/2015 12/24/14   Joline Salt Barrett, PA-C  pravastatin (PRAVACHOL) 40 MG tablet Take 1 tablet (40 mg total) by mouth every evening. Patient not taking: Reported on 11/04/2015 06/14/15   Peter M [place], MD  predniSONE (DELTASONE) 20 MG tablet Take 3 tablets (60 mg total) by mouth daily with breakfast. 11/04/15   Shon Baton, MD   BP 98/54 mmHg  Pulse 66  Resp 22  SpO2 100% Physical Exam  Constitutional: He is oriented to person, place, and time. He appears well-developed and well-nourished. No distress.  HENT:  Head: Normocephalic and atraumatic.  Cardiovascular: Normal rate, regular rhythm and normal heart sounds.   No murmur heard. Pulmonary/Chest: Effort normal. No respiratory distress. He has wheezes. He has rales. He exhibits no tenderness.  Rales left upper lobe, diffuse expiratory wheeze, good air movement  Abdominal: Soft. Bowel sounds are normal. There is no tenderness. There is no rebound.  Musculoskeletal: He exhibits no edema.  Neurological: He is alert and oriented to person, place, and time.  Skin: Skin is warm and dry.  Psychiatric: He has a normal mood and affect.  Nursing note and vitals reviewed.   ED Course  Procedures (including critical care time) DIAGNOSTIC STUDIES: Oxygen Saturation is 97% on RA, normal by my interpretation.    COORDINATION OF CARE: 12:42 AM-Discussed treatment plan which includes labs and x-ray with pt at bedside and pt agreed to plan.    Labs Review Labs Reviewed   BASIC METABOLIC PANEL - Abnormal; Notable for the following:    Glucose, Bld 179 (*)    All other components within normal limits  CBC - Abnormal; Notable for the following:    MCV 101.4 (*)    All other components within normal limits  I-STAT TROPOININ, ED    Imaging Review Dg Chest 2 View  11/04/2015  CLINICAL DATA:  Chest pain and cough.  Previous myocardial infarct. EXAM: CHEST  2 VIEW COMPARISON:  10/31/2015 FINDINGS: The heart size and mediastinal contours are within normal limits. Both lungs are clear. Pulmonary hyperinflation is again seen, consistent with COPD. No evidence of pneumothorax or pleural effusion. IMPRESSION: COPD.  No active cardiopulmonary disease. Electronically Signed   By: Myles Rosenthal M.D.   On: 11/04/2015 00:06   I have personally reviewed and evaluated these images and lab results as part of my medical decision-making.   EKG Interpretation   Date/Time:  Wednesday November 03 2015 23:17:21 EST Ventricular Rate:  77 PR Interval:  124 QRS Duration: 114 QT Interval:  420 QTC Calculation: 475 R Axis:   -53 Text Interpretation:  Normal sinus rhythm Left axis deviation Pulmonary  disease pattern Nonspecific ST and T wave abnormality Abnormal ECG No  significant change since last tracing Confirmed by Jeffifer Rabold  MD, Cande Mastropietro  ([id number]) on 11/04/2015 1:05:47 AM      MDM   Final diagnoses:  Chronic obstructive pulmonary disease with acute exacerbation (HCC)    Patient presents with chest pain, shortness of breath, and cough. Recently homeless and states that being outside is exacerbating his symptoms. He does have a history of COPD. He is wheezing on exam. Suspect chest pain is likely related to COPD. EKG is nonischemic and initial troponin is negative. Patient was given prednisone and a duo neb.    3:30 AM On reassessment, patient's wheezing improved. He is denying pain. Suspect acute COPD exacerbation. Patient reports that he is unable to get housing and will  "go to the street" if discharged. We'll hold for case management to evaluate and assist with housing. Discharge paperwork was printed to include prednisone, doxycycline, and an inhaler.  Discharge pending case management in the morning.  I personally performed the services described in this documentation, which was scribed in my presence. The recorded information has been reviewed and is accurate.    Shon Baton, MD 11/04/15 [phone]

## 2015-11-04 NOTE — Discharge Instructions (Signed)
Chronic Obstructive Pulmonary Disease Chronic obstructive pulmonary disease (COPD) is a common lung condition in which airflow from the lungs is limited. COPD is a general term that can be used to describe many different lung problems that limit airflow, including both chronic bronchitis and emphysema. If you have COPD, your lung function will probably never return to normal, but there are measures you can take to improve lung function and make yourself feel better. CAUSES   Smoking (common).  Exposure to secondhand smoke.  Genetic problems.  Chronic inflammatory lung diseases or recurrent infections. SYMPTOMS  Shortness of breath, especially with physical activity.  Deep, persistent (chronic) cough with a large amount of thick mucus.  Wheezing.  Rapid breaths (tachypnea).  Gray or bluish discoloration (cyanosis) of the skin, especially in your fingers, toes, or lips.  Fatigue.  Weight loss.  Frequent infections or episodes when breathing symptoms become much worse (exacerbations).  Chest tightness. DIAGNOSIS Your health care provider will take a medical history and perform a physical examination to diagnose COPD. Additional tests for COPD may include:  Lung (pulmonary) function tests.  Chest X-ray.  CT scan.  Blood tests. TREATMENT  Treatment for COPD may include:  Inhaler and nebulizer medicines. These help manage the symptoms of COPD and make your breathing more comfortable.  Supplemental oxygen. Supplemental oxygen is only helpful if you have a low oxygen level in your blood.  Exercise and physical activity. These are beneficial for nearly all people with COPD.  Lung surgery or transplant.  Nutrition therapy to gain weight, if you are underweight.  Pulmonary rehabilitation. This may involve working with a team of health care providers and specialists, such as respiratory, occupational, and physical therapists. HOME CARE INSTRUCTIONS  Take all medicines  (inhaled or pills) as directed by your health care provider.  Avoid over-the-counter medicines or cough syrups that dry up your airway (such as antihistamines) and slow down the elimination of secretions unless instructed otherwise by your health care provider.  If you are a smoker, the most important thing that you can do is stop smoking. Continuing to smoke will cause further lung damage and breathing trouble. Ask your health care provider for help with quitting smoking. He or she can direct you to community resources or hospitals that provide support.  Avoid exposure to irritants such as smoke, chemicals, and fumes that aggravate your breathing.  Use oxygen therapy and pulmonary rehabilitation if directed by your health care provider. If you require home oxygen therapy, ask your health care provider whether you should purchase a pulse oximeter to measure your oxygen level at home.  Avoid contact with individuals who have a contagious illness.  Avoid extreme temperature and humidity changes.  Eat healthy foods. Eating smaller, more frequent meals and resting before meals may help you maintain your strength.  Stay active, but balance activity with periods of rest. Exercise and physical activity will help you maintain your ability to do things you want to do.  Preventing infection and hospitalization is very important when you have COPD. Make sure to receive all the vaccines your health care provider recommends, especially the pneumococcal and influenza vaccines. Ask your health care provider whether you need a pneumonia vaccine.  Learn and use relaxation techniques to manage stress.  Learn and use controlled breathing techniques as directed by your health care provider. Controlled breathing techniques include:  Pursed lip breathing. Start by breathing in (inhaling) through your nose for 1 second. Then, purse your lips as if you were   going to whistle and breathe out (exhale) through the  pursed lips for 2 seconds.  Diaphragmatic breathing. Start by putting one hand on your abdomen just above your waist. Inhale slowly through your nose. The hand on your abdomen should move out. Then purse your lips and exhale slowly. You should be able to feel the hand on your abdomen moving in as you exhale.  Learn and use controlled coughing to clear mucus from your lungs. Controlled coughing is a series of short, progressive coughs. The steps of controlled coughing are: 1. Lean your head slightly forward. 2. Breathe in deeply using diaphragmatic breathing. 3. Try to hold your breath for 3 seconds. 4. Keep your mouth slightly open while coughing twice. 5. Spit any mucus out into a tissue. 6. Rest and repeat the steps once or twice as needed. SEEK MEDICAL CARE IF:  You are coughing up more mucus than usual.  There is a change in the color or thickness of your mucus.  Your breathing is more labored than usual.  Your breathing is faster than usual. SEEK IMMEDIATE MEDICAL CARE IF:  You have shortness of breath while you are resting.  You have shortness of breath that prevents you from:  Being able to talk.  Performing your usual physical activities.  You have chest pain lasting longer than 5 minutes.  Your skin color is more cyanotic than usual.  You measure low oxygen saturations for longer than 5 minutes with a pulse oximeter. MAKE SURE YOU:  Understand these instructions.  Will watch your condition.  Will get help right away if you are not doing well or get worse.   This information is not intended to replace advice given to you by your health care provider. Make sure you discuss any questions you have with your health care provider.   Document Released: 08/23/2005 Document Revised: 12/04/2014 Document Reviewed: 07/10/2013 [employer] Interactive Patient Education 2016 [employer]  

## 2015-11-07 NOTE — Progress Notes (Addendum)
Cardiology Office Note   Date:  11/08/2015   ID:  Casey Jackson, DOB 07-27-1951, MRN [id number]  PCP:  Dorrene German, MD  Cardiologist:  Dr. Swaziland  Chief Complaint  Patient presents with  . Follow-up    CAD      History of Present Illness: Casey Jackson is a 64 y.o. male with past medical history of HLD, tobacco abuse, CAD s/p PCI to RCA on 12/22/2014, and ICM with baseline EF 20-25%. He originally came in January 2016, cardiac catheterization at that time showed EF 25%, occluded LAD with L to L collateral, severe dx in mid LCx and large OM2, severe dx in mid and distal RCA. CT surgery consulted given his poor social service and likely inability to be compliant with DAPT for prolonged period of time. He was seen by Dr. Laneta Simmers who agreed with bypass surgery. Unfortunately, he later changed his mind and attempted to leave AMA on 12/22/2014. Multiple parties tried to discuss with him the high risk of sudden cardiac cath given the significant CAD and failed to convince him, eventually, Dr. Eldridge Dace was able to convince him to stay 2 more day and have PCI/BMS of mid RCA lesion so he can return later for CABG. The lesion was fixed with a bare metal stent on 12/23/2014 and he was discharged on the following day on aspirin and Effient. He has had intolerance to multiple medications including statin, beta blocker and ACE inhibitor. Interestingly, his PCP recently placed him on Lipitor and he has been tolerating okay. Since his discharge, he has failed to follow-up multiple times. He has also delayed his CABG multiple times as he has been feeling better. The last time he saw Dr. Laneta Simmers was in June at which time he agreed to undergo CABG. Apparently he was scheduled for CABG on 06/14/2015, this again was canceled once he felt better and has no further anginal symptoms. He was seen by me in the ED on 10/25/2015 for chest pain and palpitation which he attributed to Vit D tablet. We told him that it is  unlikely that vit D tablet could cause chest pain. We have stressed the danger posed by his untreated CAD, however he was not interested in CABG at the time. He was discharged after his EKG showed no changes and his trop was negative. He was seen back in the ED again on 12/4 presented with chest pain after losing his effient which he recently filled. ED discussed with Dr Johney Frame who recommended switching to plavix. He presented to ED on 11/04/2015 again with productive cough and chest congestion after being evicted from his apartment the day before and has been out in the rainy weather which exacerbated his cough.   He presented today for post ED office visit. He has not had recurrent chest pain reminding him of his angina since I saw him. He says he has not seen his PCP in awhile and planning to establish new PCP. His cold like symptom has resolved since his most recent ED visit. He has been living in [employer] during mean time.   LHC 12/22/2014 IMPRESSIONS:  1. Patent left main coronary artery. 2. Occluded left anterior descending artery. Left to left collaterals fil the branches. 3. Severe disease in the mid left circumflex artery and in the large OM 2 branch. 4. Severe disease in the mid and distal right coronary artery. There is significant disease in the mid posterior lateral artery. 5. Severely decreased left ventricular systolic function. LVEDP  26 mmHg. Ejection fraction 25%.  RECOMMENDATION: Will get cardiac surgery consult for possible bypass surgery. The patient was hesitant and would've preferred percutaneous revascularization. Given his social situation, he may not be able to take dual antiplatelet therapy for a prolonged period of time. I think bypass surgery would give him his best chance of LV recovery, and I discussed this with the patient in detail. We'll restart heparin in about 4 hours after the sheath pull. He'll need to be tested for diabetes and will need aggressive  secondary prevention.         LHC 12/23/2014  IMPRESSIONS:  6. Successful PCI of the mid right coronary artery (95% stenosis to 0%) with bare metal stent, 3.0 x 24 rebel, postdilated to greater than 3.5 mm in diameter. There is still disease in the distal RCA which would be better treated by bypass. The posterior lateral artery looks free of disease. SVG to PDA may be sufficient to revascularize the RCA territory.   RECOMMENDATION: I stressed the importance of dual antiplatelet therapy with aspirin and Effient. The patient states he will take both medications. I explained to him that he will get a card for a 30 day free supply.   The plan is for him to get bypass surgery done in a month with Dr. Laneta SimmersBartle. The patient was scheduled for bypass surgery tomorrow but earlier today, demanded that he would leave the hospital AMA. He stated he had something he had to do all he would not tell us what it was. We explained to him the risks of leaving the hospital would be reinfarction and possible death. Regardless, he was ready to leave. Since this mid right lesion was likely the culprit for his symptoms, I discussed with Dr. Laneta SimmersBartle about revascularizing this mid right coronary artery lesion with a bare-metal stent, and then planning bypass surgery after a month of dual antiplatelet therapy. The patient was agreeable as well. He will need follow-up with CT surgery in a few weeks. After 30 days, he can stop the Effient for 5 days and have his bypass surgery.  Although this was not ideal for the patient, it was the best compromise that we could come up with given his absolute desire to leave the hospital today, despite significant risks to his health.   Past Medical History  Diagnosis Date  . Osteoarthritis   . Coronary artery disease     a. Big NSTEMI 11/2014: cath with surgical disease but patient initially refused CABG. Compromise decision was made to do intervention on the RCA with a bare metal  stent with medical therapy for other disease and follow-up as an outpatient.  . Myocardial infarction [facility](HCC)     NSTEMI  . HOH (hard of hearing)   . Tobacco abuse   . Ischemic cardiomyopathy     a. a. Cath 12/22/14: EF 25%. b. 2D Echo 12/23/14: EF 25-30%, diffuse hypokinesis, akinesis of the entireinferolateral and inferior myocardium, mild MR.  [name]. Hyperlipidemia   . Transaminitis     Past Surgical History  Procedure Laterality Date  . Left heart catheterization with coronary angiogram N/A 12/22/2014    Procedure: LEFT HEART CATHETERIZATION WITH CORONARY ANGIOGRAM;  Surgeon: Corky CraftsJayadeep S Varanasi, MD; LAD 100%, CFX 90%, RI mild dz, RCA 95%/70%, EF 25%, CABG recommended  . Percutaneous coronary stent intervention (pci-s) N/A 12/23/2014    Procedure: PERCUTANEOUS CORONARY STENT INTERVENTION (PCI-S);  Surgeon: Corky CraftsJayadeep S Varanasi, MD; 3.0 x 24 rebel BMS to the RCA  Current Outpatient Prescriptions  Medication Sig Dispense Refill  . aspirin 81 MG tablet Take 81 mg by mouth daily.    [name] atorvastatin (LIPITOR) 20 MG tablet Take 20 mg by mouth every morning.    . clopidogrel (PLAVIX) 75 MG tablet Take 1 tablet (75 mg total) by mouth daily. 30 tablet 0  . nitroGLYCERIN (NITROSTAT) 0.4 MG SL tablet Place 1 tablet (0.4 mg total) under the tongue every 5 (five) minutes as needed for chest pain. 25 tablet 3  . prasugrel (EFFIENT) 10 MG TABS tablet Take 1 tablet (10 mg total) by mouth daily. 30 tablet 11   No current facility-administered medications for this visit.    Allergies:   Penicillins and Ergocalciferol    Social History:  The patient  reports that he has been smoking Cigarettes.  He has been smoking about 0.00 packs per day for the past 50 years. He uses smokeless tobacco. He reports that he does not drink alcohol or use illicit drugs.   Family History:  The patient's family history includes Cancer in his father; Hypertension in an other family member. There is no history of Heart attack  or Stroke.    ROS:  Please see the history of present illness.   Otherwise, review of systems are positive for .   All other systems are reviewed and negative.    PHYSICAL EXAM: VS:  BP 110/60 mmHg  Pulse 75  Ht  (1.803 m)  Wt 180 lb (81.647 kg)  BMI 25.12 kg/m2 , BMI Body mass index is 25.12 kg/(m^2). GEN: Well nourished, well developed, in no acute distress HEENT: normal Neck: no JVD, carotid bruits, or masses Cardiac: RRR; no murmurs, rubs, or gallops,no edema  Respiratory:  clear to auscultation bilaterally, normal work of breathing GI: soft, nontender, nondistended, + BS MS: no deformity or atrophy Skin: warm and dry, no rash Neuro:  Strength and sensation are intact Psych: euthymic mood, full affect   EKG:  EKG is ordered today. The ekg ordered today demonstrates NSR without significant ST-T wave changes   Recent Labs: 12/22/2014: TSH 2.111 01/26/2015: ALT 16 05/21/2015: B Natriuretic Peptide 58.3 11/03/2015: BUN 6; Creatinine, Ser 0.93; Hemoglobin 14.4; Platelets 219; Potassium 3.9; Sodium 138    Lipid Panel    Component Value Date/Time   CHOL 122 01/26/2015 1243   TRIG 132.0 01/26/2015 1243   HDL 31.50* 01/26/2015 1243   CHOLHDL 4 01/26/2015 1243   VLDL 26.4 01/26/2015 1243   LDLCALC 64 01/26/2015 1243      Wt Readings from Last 3 Encounters:  11/08/15 180 lb (81.647 kg)  10/25/15 190 lb (86.183 kg)  05/21/15 186 lb (84.369 kg)      Other studies Reviewed: Additional studies/ records that were reviewed today include: previous cath report in Jan, CT surgery consult note, recent ED visit note. Review of the above records demonstrates: severe untreated CAD   ASSESSMENT AND PLAN:  1.  CAD s/p BMS to mid RCA with significant residual in LCx which also perfuse the 100% occluded LAD Cathed 11/2014 EF 25%, occluded LAD with L to L collateral, severe dx in mid LCx and large OM2, severe dx in mid and distal RCA. He wanted to leave AMA before his scheduled  CABG, eventually agreed to stay 2 more days for BMS of mid RCA lesion so he can return later for CABG. He was scheduled for CABG in 05/2015, again delayed as he felt better. He has not had any angina symptom for  the last 2 weeks. He was recently given 30 day plavix supply as he lost his effient shortly after refilling it. He will run out of his plavix after 30 days, he wish to be back on his effient, I have refilled his medication. No acute EKG pattern. Followup with Dr. Swaziland in a month  2. ICM with baseline EF 20-25%: no obvious sign of heart failure symptom on exam. Recently treated for a cold in the ED, productive cough and SOB resolved.  3. HLD: continue lipitor. Says he is trying to find a local PCP, planning to see [facility] in next 2 weeks  4. tobacco abuse: cut back to 2 cigarette per day, he says he plan to stop smoking by Jan. Hopefully.   5. Social problem: recently evicted from apartment, currently living in [employer], per pt, [employer] provide 2 meals and housing. He wanted an office note to let [employer] not to let him out during 9AM to 3PM due to the cold weather. Given the degree of his significant residual CAD, I think it is very reasonable. Therefore I have given him a note.    Current medicines are reviewed at length with the patient today.  The patient does not have concerns regarding medicines.  The following changes have been made:  Given Rx for effient 10 mg starting in Jan 2017 once he run out of plavix.  Labs/ tests ordered today include:   Orders Placed This Encounter  Procedures  . EKG 12-Lead     Disposition:   FU with Dr. Swaziland in 1 month  Signed, Azalee Course PA 11/08/2015 3:41 PM    [facility] [address] [place], [place], [place]  [address] Phone: [phone]; Fax: [phone]

## 2015-11-08 ENCOUNTER — Encounter: Payer: Self-pay | Admitting: Physician Assistant

## 2015-11-08 ENCOUNTER — Ambulatory Visit (INDEPENDENT_AMBULATORY_CARE_PROVIDER_SITE_OTHER): Payer: Medicare Other | Admitting: Physician Assistant

## 2015-11-08 ENCOUNTER — Encounter: Payer: Medicare Other | Admitting: Nurse Practitioner

## 2015-11-08 VITALS — BP 110/60 | HR 75 | Ht 71.0 in | Wt 180.0 lb

## 2015-11-08 DIAGNOSIS — I200 Unstable angina: Secondary | ICD-10-CM

## 2015-11-08 DIAGNOSIS — I2510 Atherosclerotic heart disease of native coronary artery without angina pectoris: Secondary | ICD-10-CM | POA: Diagnosis not present

## 2015-11-08 DIAGNOSIS — I5022 Chronic systolic (congestive) heart failure: Secondary | ICD-10-CM | POA: Diagnosis not present

## 2015-11-08 MED ORDER — PRASUGREL HCL 10 MG PO TABS: 10.0000 mg | ORAL_TABLET | Freq: Every day | ORAL | Status: DC

## 2015-11-08 NOTE — Patient Instructions (Signed)
Medication Instructions:  Your physician has recommended you make the following change in your medication:  1. Start Effient ( 10 mg ) daily, when you start this medication stop plavix   Labwork: -None  Testing/Procedures: -None  Follow-Up: Your physician recommends that you keep scheduled follow-up appointment  With Dr. Thomasene LotJordon Follow up with [facility] in one week.   Any Other Special Instructions Will Be Listed Below (If Applicable).     If you need a refill on your cardiac medications before your next appointment, please call your pharmacy.

## 2015-11-22 ENCOUNTER — Emergency Department (HOSPITAL_COMMUNITY)
Admission: EM | Admit: 2015-11-22 | Discharge: 2015-11-23 | Disposition: A | Discharge status: Home / Self Care | Payer: Medicare Other | Attending: Emergency Medicine | Admitting: Emergency Medicine

## 2015-11-22 ENCOUNTER — Emergency Department (HOSPITAL_COMMUNITY): Payer: Medicare Other

## 2015-11-22 ENCOUNTER — Encounter (HOSPITAL_COMMUNITY): Payer: Self-pay | Admitting: Emergency Medicine

## 2015-11-22 DIAGNOSIS — H905 Unspecified sensorineural hearing loss: Secondary | ICD-10-CM | POA: Insufficient documentation

## 2015-11-22 DIAGNOSIS — F17210 Nicotine dependence, cigarettes, uncomplicated: Secondary | ICD-10-CM | POA: Insufficient documentation

## 2015-11-22 DIAGNOSIS — Z79899 Other long term (current) drug therapy: Secondary | ICD-10-CM | POA: Diagnosis not present

## 2015-11-22 DIAGNOSIS — Z880 Allergy status to penicillin: Secondary | ICD-10-CM | POA: Insufficient documentation

## 2015-11-22 DIAGNOSIS — R079 Chest pain, unspecified: Secondary | ICD-10-CM | POA: Diagnosis present

## 2015-11-22 DIAGNOSIS — R0789 Other chest pain: Secondary | ICD-10-CM | POA: Diagnosis not present

## 2015-11-22 DIAGNOSIS — Z7982 Long term (current) use of aspirin: Secondary | ICD-10-CM | POA: Insufficient documentation

## 2015-11-22 DIAGNOSIS — I252 Old myocardial infarction: Secondary | ICD-10-CM | POA: Insufficient documentation

## 2015-11-22 DIAGNOSIS — E785 Hyperlipidemia, unspecified: Secondary | ICD-10-CM | POA: Insufficient documentation

## 2015-11-22 DIAGNOSIS — I2510 Atherosclerotic heart disease of native coronary artery without angina pectoris: Secondary | ICD-10-CM | POA: Diagnosis not present

## 2015-11-22 DIAGNOSIS — Z7902 Long term (current) use of antithrombotics/antiplatelets: Secondary | ICD-10-CM | POA: Diagnosis not present

## 2015-11-22 DIAGNOSIS — M1990 Unspecified osteoarthritis, unspecified site: Secondary | ICD-10-CM | POA: Diagnosis not present

## 2015-11-22 DIAGNOSIS — R072 Precordial pain: Secondary | ICD-10-CM

## 2015-11-22 LAB — BASIC METABOLIC PANEL
ANION GAP: 8 (ref 5–15)
BUN: 10 mg/dL (ref 6–20)
CHLORIDE: 103 mmol/L (ref 101–111)
CO2: 30 mmol/L (ref 22–32)
CREATININE: 0.97 mg/dL (ref 0.61–1.24)
Calcium: 9.3 mg/dL (ref 8.9–10.3)
GFR calc non Af Amer: >60 mL/min (ref >60)
Glucose, Bld: 102 mg/dL — ABNORMAL HIGH (ref 65–99)
POTASSIUM: 4.3 mmol/L (ref 3.5–5.1)
SODIUM: 141 mmol/L (ref 135–145)

## 2015-11-22 LAB — CBC
HCT: 46.3 % (ref 39.0–52.0)
HEMOGLOBIN: 15.5 g/dL (ref 13.0–17.0)
MCH: 33.4 pg (ref 26.0–34.0)
MCHC: 33.5 g/dL (ref 30.0–36.0)
MCV: 99.8 fL (ref 78.0–100.0)
PLATELETS: 191 10*3/uL (ref 150–400)
RBC: 4.64 MIL/uL (ref 4.22–5.81)
RDW: 13.1 % (ref 11.5–15.5)
WBC: 6.4 10*3/uL (ref 4.0–10.5)

## 2015-11-22 LAB — I-STAT TROPONIN, ED: TROPONIN I, POC: 0 ng/mL (ref 0.00–0.08)

## 2015-11-22 NOTE — Discharge Instructions (Signed)
Chest Pain Observation  It is often hard to give a specific diagnosis for the cause of chest pain. Among other possibilities your symptoms might be caused by inadequate oxygen delivery to your heart (angina). Angina that is not treated or evaluated can lead to a heart attack (myocardial infarction) or death.  Blood tests, electrocardiograms, and X-rays may have been done to help determine a possible cause of your chest pain. After evaluation and observation, your health care provider has determined that it is unlikely your pain was caused by an unstable condition that requires hospitalization. However, a full evaluation of your pain may need to be completed, with additional diagnostic testing as directed. It is very important to keep your follow-up appointments. Not keeping your follow-up appointments could result in permanent heart damage, disability, or death. If there is any problem keeping your follow-up appointments, you must call your health care provider.  HOME CARE INSTRUCTIONS   Due to the slight chance that your pain could be angina, it is important to follow your health care provider's treatment plan and also maintain a healthy lifestyle:  · Maintain or work toward achieving a healthy weight.  · Stay physically active and exercise regularly.  · Decrease your salt intake.  · Eat a balanced, healthy diet. Talk to a dietitian to learn about heart-healthy foods.  · Increase your fiber intake by including whole grains, vegetables, fruits, and nuts in your diet.  · Avoid situations that cause stress, anger, or depression.  · Take medicines as advised by your health care provider. Report any side effects to your health care provider. Do not stop medicines or adjust the dosages on your own.  · Quit smoking. Do not use nicotine patches or gum until you check with your health care provider.  · Keep your blood pressure, blood sugar, and cholesterol levels within normal limits.  · Limit alcohol intake to no more than  1 drink per day for women who are not pregnant and 2 drinks per day for men.  · Do not abuse drugs.  SEEK IMMEDIATE MEDICAL CARE IF:  You have severe chest pain or pressure which may include symptoms such as:  · You feel pain or pressure in your arms, neck, jaw, or back.  · You have severe back or abdominal pain, feel sick to your stomach (nauseous), or throw up (vomit).  · You are sweating profusely.  · You are having a fast or irregular heartbeat.  · You feel short of breath while at rest.  · You notice increasing shortness of breath during rest, sleep, or with activity.  · You have chest pain that does not get better after rest or after taking your usual medicine.  · You wake from sleep with chest pain.  · You are unable to sleep because you cannot breathe.  · You develop a frequent cough or you are coughing up blood.  · You feel dizzy, faint, or experience extreme fatigue.  · You develop severe weakness, dizziness, fainting, or chills.  Any of these symptoms may represent a serious problem that is an emergency. Do not wait to see if the symptoms will go away. Call your local emergency services (911 in the [place] Do not drive yourself to the hospital.  MAKE SURE YOU:  · Understand these instructions.  · Will watch your condition.  · Will get help right away if you are not doing well or get worse.     This information is not intended to replace   advice given to you by your health care provider. Make sure you discuss any questions you have with your health care provider.     Document Released: 12/16/2010 Document Revised: 11/18/2013 Document Reviewed: 05/15/2013  Elsevier Interactive Patient Education ©2016 [employer]      Nonspecific Chest Pain  It is often hard to find the cause of chest pain. There is always a chance that your pain could be related to something serious, such as a heart attack or a blood clot in your lungs. Chest pain can also be caused by conditions that are not life-threatening. If you have  chest pain, it is very important to follow up with your doctor.    HOME CARE  · If you were prescribed an antibiotic medicine, finish it all even if you start to feel better.  · Avoid any activities that cause chest pain.  · Do not use any tobacco products, including cigarettes, chewing tobacco, or electronic cigarettes. If you need help quitting, ask your doctor.  · Do not drink alcohol.  · Take medicines only as told by your doctor.  · Keep all follow-up visits as told by your doctor. This is important. This includes any further testing if your chest pain does not go away.  · Your doctor may tell you to keep your head raised (elevated) while you sleep.  · Make lifestyle changes as told by your doctor. These may include:    Getting regular exercise. Ask your doctor to suggest some activities that are safe for you.    Eating a heart-healthy diet. Your doctor or a diet specialist (dietitian) can help you to learn healthy eating options.    Maintaining a healthy weight.    Managing diabetes, if necessary.    Reducing stress.  GET HELP IF:  · Your chest pain does not go away, even after treatment.  · You have a rash with blisters on your chest.  · You have a fever.  GET HELP RIGHT AWAY IF:  · Your chest pain is worse.  · You have an increasing cough, or you cough up blood.  · You have severe belly (abdominal) pain.  · You feel extremely weak.  · You pass out (faint).  · You have chills.  · You have sudden, unexplained chest discomfort.  · You have sudden, unexplained discomfort in your arms, back, neck, or jaw.  · You have shortness of breath at any time.  · You suddenly start to sweat, or your skin gets clammy.  · You feel nauseous.  · You vomit.  · You suddenly feel light-headed or dizzy.  · Your heart begins to beat quickly, or it feels like it is skipping beats.  These symptoms may be an emergency. Do not wait to see if the symptoms will go away. Get medical help right away. Call your local emergency services (911  in the [place] Do not drive yourself to the hospital.     This information is not intended to replace advice given to you by your health care provider. Make sure you discuss any questions you have with your health care provider.     Document Released: 05/01/2008 Document Revised: 12/04/2014 Document Reviewed: 06/19/2014  Elsevier Interactive Patient Education ©2016 [employer]

## 2015-11-22 NOTE — ED Provider Notes (Signed)
CSN: [id number]     Arrival date & time 11/22/15  2209 History   First MD Initiated Contact with Patient 11/22/15 2216     Chief Complaint  Patient presents with  . Chest Pain     (Consider location/radiation/quality/duration/timing/severity/associated sxs/prior Treatment) Patient is a 64 y.o. male presenting with chest pain. The history is provided by the patient.  Chest Pain Pain location:  Substernal area Pain quality: aching   Pain radiates to:  Does not radiate Pain radiates to the back: no   Pain severity:  Moderate Onset quality:  Gradual Duration:  8 hours Timing:  Constant Progression:  Unchanged Chronicity:  Recurrent Context: not eating, not lifting, no movement, no stress and no trauma   Relieved by:  None tried Worsened by:  Nothing tried Ineffective treatments:  None tried Associated symptoms: no abdominal pain, no AICD problem, no altered mental status, no anorexia, no anxiety, no back pain, no claudication, no cough, no diaphoresis, no dizziness, no dysphagia, no fatigue, no fever, no headache, no heartburn, no lower extremity edema, no nausea, no near-syncope, no numbness, no palpitations, no PND, no shortness of breath, no syncope, not vomiting and no weakness   Risk factors: coronary artery disease, high cholesterol and male sex     Past Medical History  Diagnosis Date  . Osteoarthritis   . Coronary artery disease     a. Big NSTEMI 11/2014: cath with surgical disease but patient initially refused CABG. Compromise decision was made to do intervention on the RCA with a bare metal stent with medical therapy for other disease and follow-up as an outpatient.  . Myocardial infarction [facility])     NSTEMI  . HOH (hard of hearing)   . Tobacco abuse   . Ischemic cardiomyopathy     a. a. Cath 12/22/14: EF 25%. b. 2D Echo 12/23/14: EF 25-30%, diffuse hypokinesis, akinesis of the entireinferolateral and inferior myocardium, mild MR.  [name]. Hyperlipidemia   . Transaminitis     Past Surgical History  Procedure Laterality Date  . Left heart catheterization with coronary angiogram N/A 12/22/2014    Procedure: LEFT HEART CATHETERIZATION WITH CORONARY ANGIOGRAM;  Surgeon: Corky CraftsJayadeep S Varanasi, MD; LAD 100%, CFX 90%, RI mild dz, RCA 95%/70%, EF 25%, CABG recommended  . Percutaneous coronary stent intervention (pci-s) N/A 12/23/2014    Procedure: PERCUTANEOUS CORONARY STENT INTERVENTION (PCI-S);  Surgeon: Corky CraftsJayadeep S Varanasi, MD; 3.0 x 24 rebel BMS to the RCA    Family History  Problem Relation Age of Onset  . Hypertension    . Heart attack Neg Hx   . Stroke Neg Hx   . Cancer Father    Social History  Substance Use Topics  . Smoking status: Current Every Day Smoker -- 0.00 packs/day for 50 years    Types: Cigarettes  . Smokeless tobacco: Current User  . Alcohol Use: No    Review of Systems  Constitutional: Negative for fever, diaphoresis and fatigue.  HENT: Negative for trouble swallowing.   Eyes: Negative for pain.  Respiratory: Positive for chest tightness. Negative for cough and shortness of breath.   Cardiovascular: Positive for chest pain. Negative for palpitations, claudication, syncope, PND and near-syncope.  Gastrointestinal: Negative for heartburn, nausea, vomiting, abdominal pain and anorexia.  Genitourinary: Negative for dysuria.  Musculoskeletal: Negative for back pain.  Neurological: Negative for dizziness, weakness, numbness and headaches.      Allergies  Penicillins and Ergocalciferol  Home Medications   Prior to Admission medications   Medication Sig Start  Date End Date Taking? Authorizing Provider  aspirin 81 MG tablet Take 81 mg by mouth daily.    Historical Provider, MD  atorvastatin (LIPITOR) 20 MG tablet Take 20 mg by mouth every morning.    Historical Provider, MD  clopidogrel (PLAVIX) 75 MG tablet Take 1 tablet (75 mg total) by mouth daily. 10/31/15   Geoffery Lyons, MD  nitroGLYCERIN (NITROSTAT) 0.4 MG SL tablet Place 1 tablet  (0.4 mg total) under the tongue every 5 (five) minutes as needed for chest pain. 12/24/14   Rhonda G Barrett, PA-C  prasugrel (EFFIENT) 10 MG TABS tablet Take 1 tablet (10 mg total) by mouth daily. 11/08/15   Azalee Course, PA   BP 126/71 mmHg  Pulse 62  Resp 23  Ht  (1.803 m)  Wt 81.647 kg  BMI 25.12 kg/m2  SpO2 98% Physical Exam  Constitutional: He is oriented to person, place, and time. He appears well-developed and well-nourished. No distress.  HENT:  Head: Atraumatic.  Mouth/Throat: No oropharyngeal exudate.  Eyes: Conjunctivae and EOM are normal. Pupils are equal, round, and reactive to light.  Neck: Normal range of motion. Neck supple.  Cardiovascular: Normal rate and regular rhythm.  Exam reveals no gallop and no friction rub.   No murmur heard. Pulmonary/Chest: Effort normal and breath sounds normal. No respiratory distress. He has no wheezes. He has no rales. He exhibits no tenderness.  Abdominal: Soft. Bowel sounds are normal. He exhibits no distension. There is no tenderness. There is no rebound and no guarding.  Neurological: He is alert and oriented to person, place, and time. No cranial nerve deficit.  Skin: Skin is warm and dry. He is not diaphoretic.  Psychiatric: He has a normal mood and affect.    ED Course  Procedures (including critical care time) Labs Review Labs Reviewed  BASIC METABOLIC PANEL - Abnormal; Notable for the following:    Glucose, Bld 102 (*)    All other components within normal limits  CBC  I-STAT TROPOININ, ED    Imaging Review Dg Chest 2 View  11/22/2015  CLINICAL DATA:  64 year old male with mid chest pain. EXAM: CHEST  2 VIEW COMPARISON:  Chest radiograph dated 11/03/2015 FINDINGS: The heart size and mediastinal contours are within normal limits. Both lungs are clear. The visualized skeletal structures are unremarkable. IMPRESSION: No active cardiopulmonary disease. Electronically Signed   By: Elgie Collard M.D.   On: 11/22/2015  22:41   I have personally reviewed and evaluated these images and lab results as part of my medical decision-making.   EKG Interpretation   Date/Time:  Monday November 22 2015 22:11:07 EST Ventricular Rate:  67 PR Interval:  132 QRS Duration: 121 QT Interval:  394 QTC Calculation: 416 R Axis:   -57 Text Interpretation:  Sinus rhythm RBBB and LAFB Anteroseptal infarct, old  Nonspecific T abnormalities, lateral leads since last tracing no  significant change Confirmed by MILLER  MD, BRIAN ([id number]) on 11/22/2015  10:51:08 PM      MDM   Final diagnoses:  Retrosternal chest pain    64 year old Caucasian male past mental history of coronary artery disease status post stent approximately one year ago presents in setting of aching retrosternal chest pain. Patient reports this began gradually today and has been constant for 6-8 hours. He reports due to continued pain he presented to the emergency department. Patient normally takes prasugrel for stent however he lost his medication earlier this month. Patient was given prescription for Plavix due to  inability to afford prasugrel. Patient reports he has 3 more days until his insurance will pay for prasugrel again. He additionally has been recommended to have CABG and has declined several times but does have follow-up in January with CT surgery for possible CABG.  On arrival patient was hemodynamically stable and continued to complain of pain which was unchanged. Throughout examination patient perseverated on need for prasugrel over Plavix. He reports he thinks Plavix is in inferior medication that his blood is thicker than normal which is why he is having pain. Patient reports that he does not believe this pain is related to a heart attack as he "knows what a heart attack is". Patient asked if he could give him 3 pills a prasugerl to hold him over until his medications came in. Patient denied any shortness of breath, trauma, radiation to neck or  jaw, diaphoresis. EKG was obtained which was unchanged from previous EKGs. No acute signs of ischemia at this time. Patient's initial troponin was not elevated. Patient without significant electrolyte abnormalities or signs of infection. Chest x-ray was obtained which does not reveal any acute cardiopulmonary abnormalities. I discussed options with patient and at this time as he is not having significant elevation in troponin or EKG changes he does not want to stay for further management. Patient again asked if he could have to go to medicine and I explained this would not be possible. Patient reports that is why he came to the emergency department and if this cannot happen, he does not want to stay for further management this time. Patient reports close follow-up CT surgery and cardiology and patient was given strict return percussion. Patient stable at time of discharge and in agreement with plan.  Attending has seen and evaluated patient in Dr. Rondel Baton in agreement with plan.    Stacy Gardner, MD 11/22/15 1610  Eber Hong, MD 11/23/15 [phone]

## 2015-11-22 NOTE — ED Notes (Signed)
Pt brought to ED by [facility] from home for 4/10 mid CP that radiates to his shoulder, 324 mg gotten prior to arrival. VSS HR 70, BP 129/1, R 18. Pt states he lost his blood thinner medication and is only taking plavix now.

## 2015-11-22 NOTE — ED Notes (Signed)
Patient transported to X-ray 

## 2015-11-22 NOTE — ED Provider Notes (Signed)
I saw and evaluated the patient, reviewed the resident's note and I agree with the findings and plan.  Pertinent History: The patient is a 64 year old male, he has a known history of hypertension, hypercholesterolemia as well as coronary disease, this was found to be 3 vessel coronary disease on heart catheterization, it was recommended that he undergo bypass grafting however the patient canceled his appointment, then return to see the cardiologist and has now agreed to pursue that. He has an appointment with Dr. SwazilandJordan, his cardiologist this coming week. He states that he is supposed to be on Effient but he cannot afford it so he has been using Plavix which she states he can't afford. He reports having over 12 hours of ongoing chest pain today that started at 10 AM and resolved by the time he got in the ambulance. He has no symptoms at this time. He has no shortness of breath nausea and has no swelling in his legs. Pertinent Exam findings: Normal respiratory rate, no distress, strong pulses at the radial arteries, no JVD or peripheral edema.MMM, no m/r/g.   EKG Interpretation  Date/Time:  Monday November 22 2015 22:11:07 EST Ventricular Rate:  67 PR Interval:  132 QRS Duration: 121 QT Interval:  394 QTC Calculation: 416 R Axis:   -57 Text Interpretation:  Sinus rhythm RBBB and LAFB Anteroseptal infarct, old Nonspecific T abnormalities, lateral leads since last tracing no significant change Confirmed by Ivaan Liddy  MD, Naliyah Neth ([id number]) on 11/22/2015 10:51:08 PM      The pt refused admission when I had the discussion with him - he will continue Plavix until his insurance will cover his effient in 3 days.  Pt will return to ED if sx worsen.  I personally interpreted the EKG as well as the resident and agree with the interpretation on the resident's chart.  Final diagnoses:  Retrosternal chest pain      Eber HongBrian Ciel Chervenak, MD 11/23/15 1031

## 2015-11-23 NOTE — ED Notes (Signed)
Pt wheeled out by nurse tech, cab boucher to be given by CN for transportation.

## 2015-12-14 ENCOUNTER — Ambulatory Visit (INDEPENDENT_AMBULATORY_CARE_PROVIDER_SITE_OTHER): Payer: Medicare Other | Admitting: Cardiology

## 2015-12-14 ENCOUNTER — Encounter: Payer: Self-pay | Admitting: Cardiology

## 2015-12-14 VITALS — BP 120/68 | HR 72 | Ht 71.0 in | Wt 179.0 lb

## 2015-12-14 DIAGNOSIS — I255 Ischemic cardiomyopathy: Secondary | ICD-10-CM

## 2015-12-14 DIAGNOSIS — I2510 Atherosclerotic heart disease of native coronary artery without angina pectoris: Secondary | ICD-10-CM | POA: Diagnosis not present

## 2015-12-14 DIAGNOSIS — Z720 Tobacco use: Secondary | ICD-10-CM

## 2015-12-14 DIAGNOSIS — I5022 Chronic systolic (congestive) heart failure: Secondary | ICD-10-CM

## 2015-12-14 NOTE — Patient Instructions (Signed)
You may stop Effient at the end of this month.   Continue your other therapy  I will see you in 6 months.

## 2015-12-14 NOTE — Progress Notes (Signed)
Cardiology Office Note   Date:  12/14/2015   ID:  Casey Jackson, DOB [birth year], MRN [id number]  PCP:  Dorrene German, MD  Cardiologist:  Dr. Swaziland   Chief Complaint  Patient presents with  . Follow-up    no chest pain, no shortness of breath, no swelling, no cramping, no dizziness or lightheadedness      History of Present Illness: Casey Jackson is a 65 y.o. male who presents for eval of his CAD. He was admitted in January with NSTEMI, ischemic cardiomyopathy, hyperlipidemia, tobacco abuse. Cardiac catheterization demonstrated 3 vessel CAD. CABG was recommended. However, the patient refused due to social issues. He was taken back to the Cath Lab and underwent PCI of the RCA with a bare metal stent. This was in order for him to get his social issues in order.   Echocardiogram demonstrated reduced EF at 25-30%. LifeVest was recommended. The patient declined.  He was maintained on aspirin and Effient. He has been tried on several beta blockers including Bystolic, Coreg, and Toprol and an ACEi but has been unable to tolerate with symptoms of chest pain, dizziness, and feeling drunk. He was initially intolerant of statin but later started on lipitor by primary care and is tolerating this. The plan was for him to have CABG and this was actually scheduled by Dr. Laneta Simmers back in July. The patient changed his mind stating he felt too good. Seen in the ED last month with atypical chest pain. Thought this was related to taking his medication at the wrong time of day. Deferred further evaluation.  He has cut tobacco to 2 cigarettes a day.    On follow up today he is currently staying at the [employer]. Was evicted from his apartment. States he is eating healthy and walking 1 mile per day. Denies chest pain or SOB.   Past Medical History  Diagnosis Date  . Osteoarthritis   . Coronary artery disease     a. Big NSTEMI 11/2014: cath with surgical disease but patient initially refused CABG.  Compromise decision was made to do intervention on the RCA with a bare metal stent with medical therapy for other disease and follow-up as an outpatient.  . Myocardial infarction [facility])     NSTEMI  . HOH (hard of hearing)   . Tobacco abuse   . Ischemic cardiomyopathy     a. a. Cath 12/22/14: EF 25%. b. 2D Echo 12/23/14: EF 25-30%, diffuse hypokinesis, akinesis of the entireinferolateral and inferior myocardium, mild MR.  [name] Hyperlipidemia   . Transaminitis     Past Surgical History  Procedure Laterality Date  . Left heart catheterization with coronary angiogram N/A 12/22/2014    Procedure: LEFT HEART CATHETERIZATION WITH CORONARY ANGIOGRAM;  Surgeon: Corky Crafts, MD; LAD 100%, CFX 90%, RI mild dz, RCA 95%/70%, EF 25%, CABG recommended  . Percutaneous coronary stent intervention (pci-s) N/A 12/23/2014    Procedure: PERCUTANEOUS CORONARY STENT INTERVENTION (PCI-S);  Surgeon: Corky Crafts, MD; 3.0 x 24 rebel BMS to the RCA      Current Outpatient Prescriptions  Medication Sig Dispense Refill  . aspirin 81 MG tablet Take 81 mg by mouth daily.    [name] atorvastatin (LIPITOR) 20 MG tablet Take 20 mg by mouth every morning.    [name] HYDROcodone-acetaminophen (NORCO/VICODIN) 5-325 MG tablet TK 1 T PO TID  0  . nitroGLYCERIN (NITROSTAT) 0.4 MG SL tablet Place 1 tablet (0.4 mg total) under the tongue every 5 (five) minutes as needed for  chest pain. 25 tablet 3  . prasugrel (EFFIENT) 10 MG TABS tablet Take 1 tablet (10 mg total) by mouth daily. 30 tablet 11   No current facility-administered medications for this visit.    Allergies:   Penicillins and Ergocalciferol    Social History:  The patient  reports that he has been smoking Cigarettes.  He has been smoking about 0.00 packs per day for the past 50 years. He uses smokeless tobacco. He reports that he does not drink alcohol or use illicit drugs.   Family History:  The patient's family history includes Cancer in his father. There is no  history of Heart attack or Stroke.    ROS:  As noted in HPI. All other systems reviewed and are negative.   Wt Readings from Last 3 Encounters:  12/14/15 81.194 kg (179 lb)  11/22/15 81.647 kg (180 lb)  11/08/15 81.647 kg (180 lb)     PHYSICAL EXAM: VS:  BP 120/68 mmHg  Pulse 72  Ht  (1.803 m)  Wt 81.194 kg (179 lb)  BMI 24.98 kg/m2 , BMI Body mass index is 24.98 kg/(m^2). General:Pleasant affect, NAD Skin:Warm and dry, brisk capillary refill HEENT:normocephalic, sclera clear, mucus membranes moist Neck:supple, no JVD, no bruits  Heart:S1S2 RRR without murmur, gallup, rub or click Lungs:clear without rales, rhonchi, or wheezes ZOX:WRUE, non tender, + BS, do not palpate liver spleen or masses Ext:no lower ext edema, 2+ pedal pulses, 2+ radial pulses Neuro:alert and oriented, MAE, follows commands, + facial symmetry    EKG:  EKG is NOT ordered today. The ekg from ER without acute changes   Recent Labs: 12/22/2014: TSH 2.111 01/26/2015: ALT 16 05/21/2015: B Natriuretic Peptide 58.3 11/22/2015: BUN 10; Creatinine, Ser 0.97; Hemoglobin 15.5; Platelets 191; Potassium 4.3; Sodium 141    Lipid Panel    Component Value Date/Time   CHOL 122 01/26/2015 1243   TRIG 132.0 01/26/2015 1243   HDL 31.50* 01/26/2015 1243   CHOLHDL 4 01/26/2015 1243   VLDL 26.4 01/26/2015 1243   LDLCALC 64 01/26/2015 1243       Other studies Reviewed: Additional studies/ records that were reviewed today include:none   ASSESSMENT AND PLAN: Coronary artery disease involving native coronary artery of native heart without angina pectoris I reviewed his cath findings from January 2016.[name] He has CTO of the LAD with collaterals. There is severe disease in the LCX which supplies 2 marginal branches. A critical lesion in the mid RCA was stented successfully but there was a severe ulcerated stenosis in the distal RCA prior to the bifurcation.   He remains on aspirin and Effient. Recommend that he stop  Effient at the end of this month. Although he has high risk anatomy he does not want CABG.   Cardiomyopathy, ischemic Intolerant of even low dose ACEi or beta blocker. No overt CHF.  I think his LV EF is not likely to improve without revascularization. He has critical 3 vessel disease and is at high risk going forward.   Hyperlipidemia LDL goal <70 Continue statin. last LDL optimal.  Tobacco abuse  He continues to try to quit. Sown to 2 cigarettes a day.    Signed, Oluwademilade Kellett Swaziland, MD  12/14/2015 1:50 PM    [facility] [address] [place], [place], [place]  [address]/ [address] [place], [place] Phone: [phone]; Fax: [phone]  [phone]

## 2016-01-12 ENCOUNTER — Emergency Department (HOSPITAL_COMMUNITY)
Admission: EM | Admit: 2016-01-12 | Discharge: 2016-01-12 | Disposition: A | Discharge status: Home / Self Care | Payer: Medicare Other | Attending: Emergency Medicine | Admitting: Emergency Medicine

## 2016-01-12 ENCOUNTER — Encounter (HOSPITAL_COMMUNITY): Payer: Self-pay | Admitting: Vascular Surgery

## 2016-01-12 DIAGNOSIS — M79662 Pain in left lower leg: Secondary | ICD-10-CM | POA: Diagnosis present

## 2016-01-12 DIAGNOSIS — F17210 Nicotine dependence, cigarettes, uncomplicated: Secondary | ICD-10-CM | POA: Insufficient documentation

## 2016-01-12 DIAGNOSIS — M79661 Pain in right lower leg: Secondary | ICD-10-CM | POA: Insufficient documentation

## 2016-01-12 DIAGNOSIS — H9190 Unspecified hearing loss, unspecified ear: Secondary | ICD-10-CM | POA: Insufficient documentation

## 2016-01-12 DIAGNOSIS — I2510 Atherosclerotic heart disease of native coronary artery without angina pectoris: Secondary | ICD-10-CM | POA: Insufficient documentation

## 2016-01-12 NOTE — ED Notes (Signed)
Pt reports to the ED for eval of bilateral lower leg pain. Pt reports he has left hip and knee joint deterioration and left calf muscle deterioration. As well as blockages in his right leg. Pt reports his right knee And hip now hurt. Denies any recent injury. Pt reports the pain began approx 1 week ago. Pt A&Ox4, resp e/u, and skin warm and dry. He is from [facility] and does not have a PCP in [place].

## 2016-01-12 NOTE — ED Notes (Signed)
Called patient x2 in main ED waiting area to revitalize; no one answered

## 2016-01-24 IMAGING — DX DG CHEST 2V
2 series · 2 of 2 positions shown · non-contrast
Comparison: Chest x-rays dated 10/25/2015, 05/21/2015 and
04/13/2015.

CLINICAL DATA: Mild chest discomfort and mild palpitations. Stent
placement in [REDACTED].

EXAM:
CHEST  2 VIEW

[chest pa]
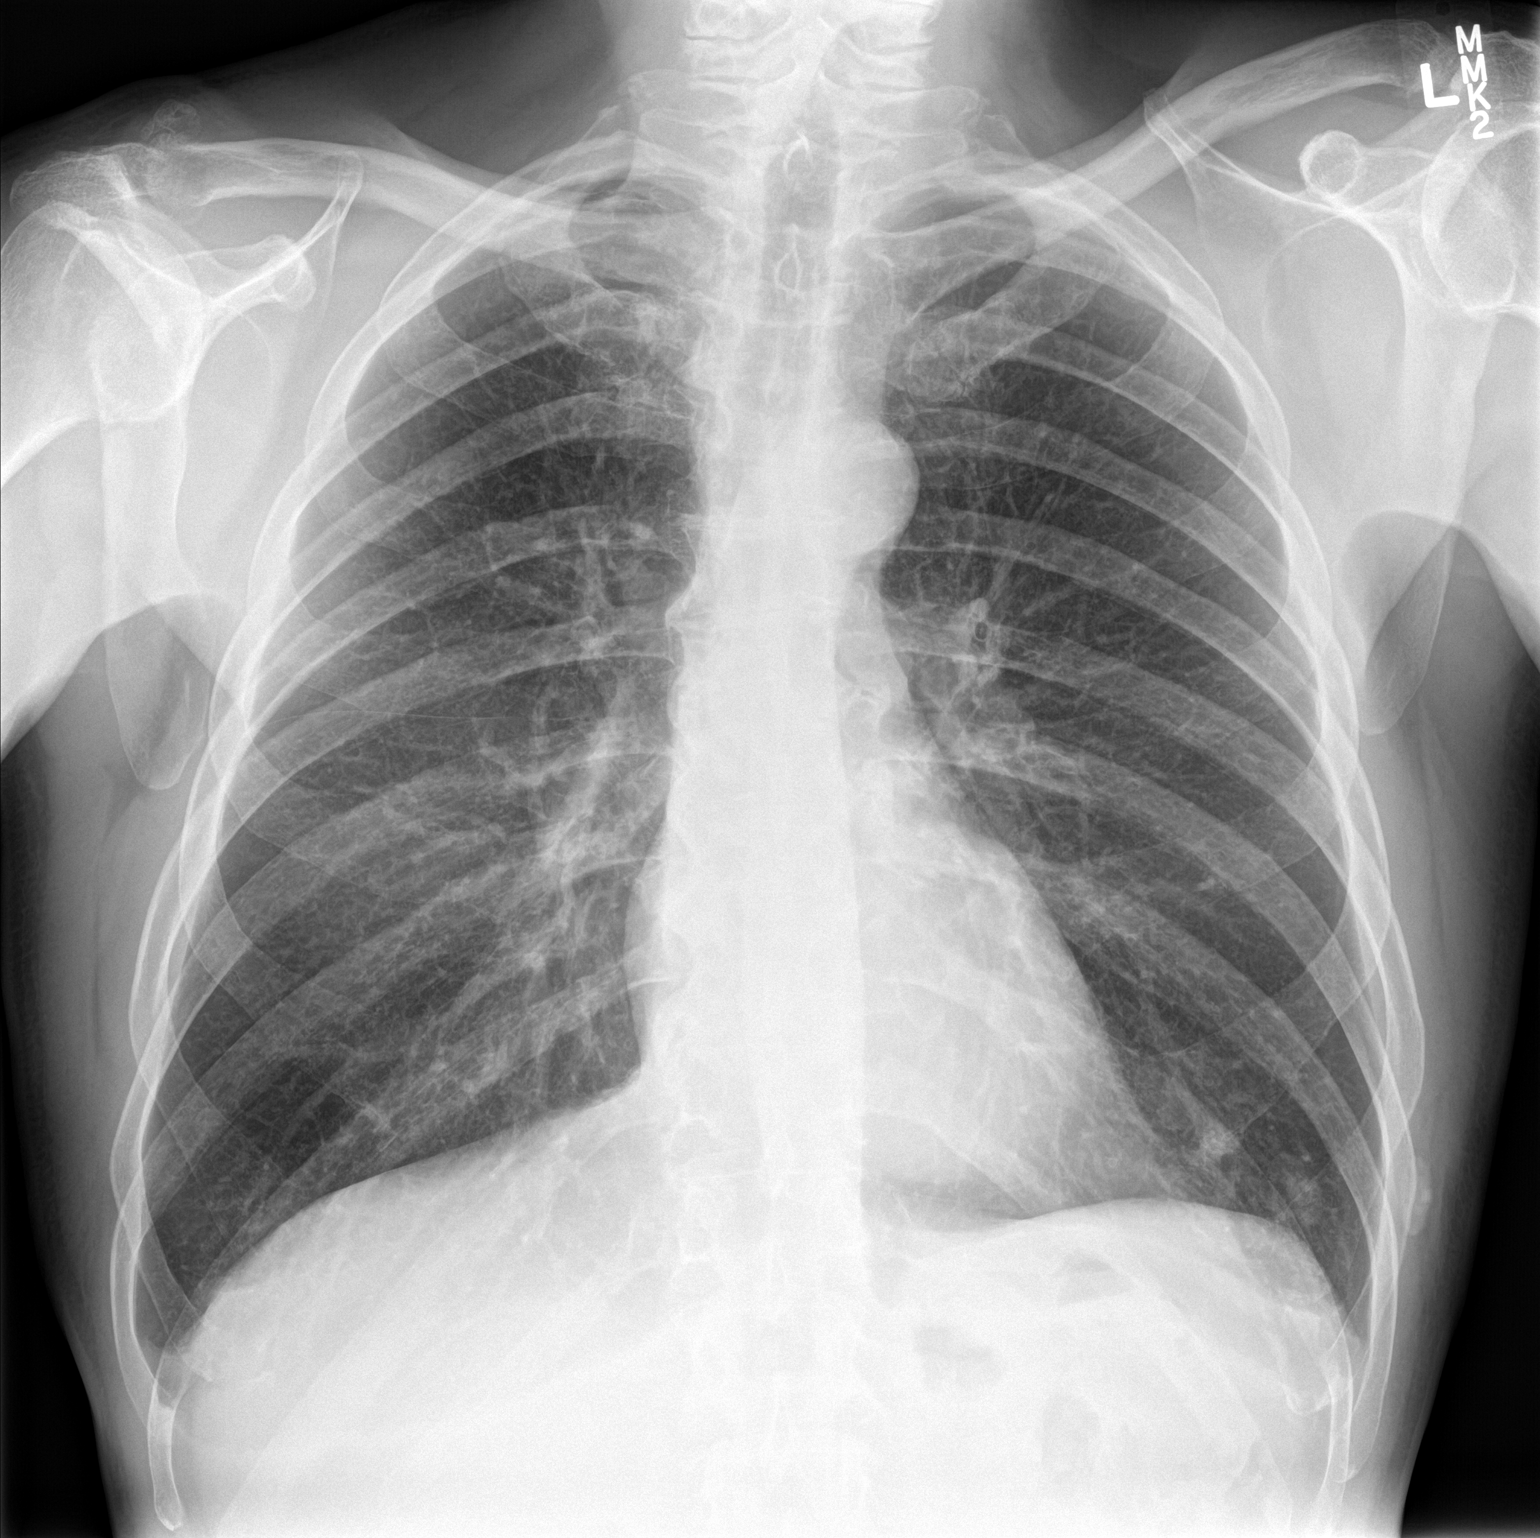

[chest lat]
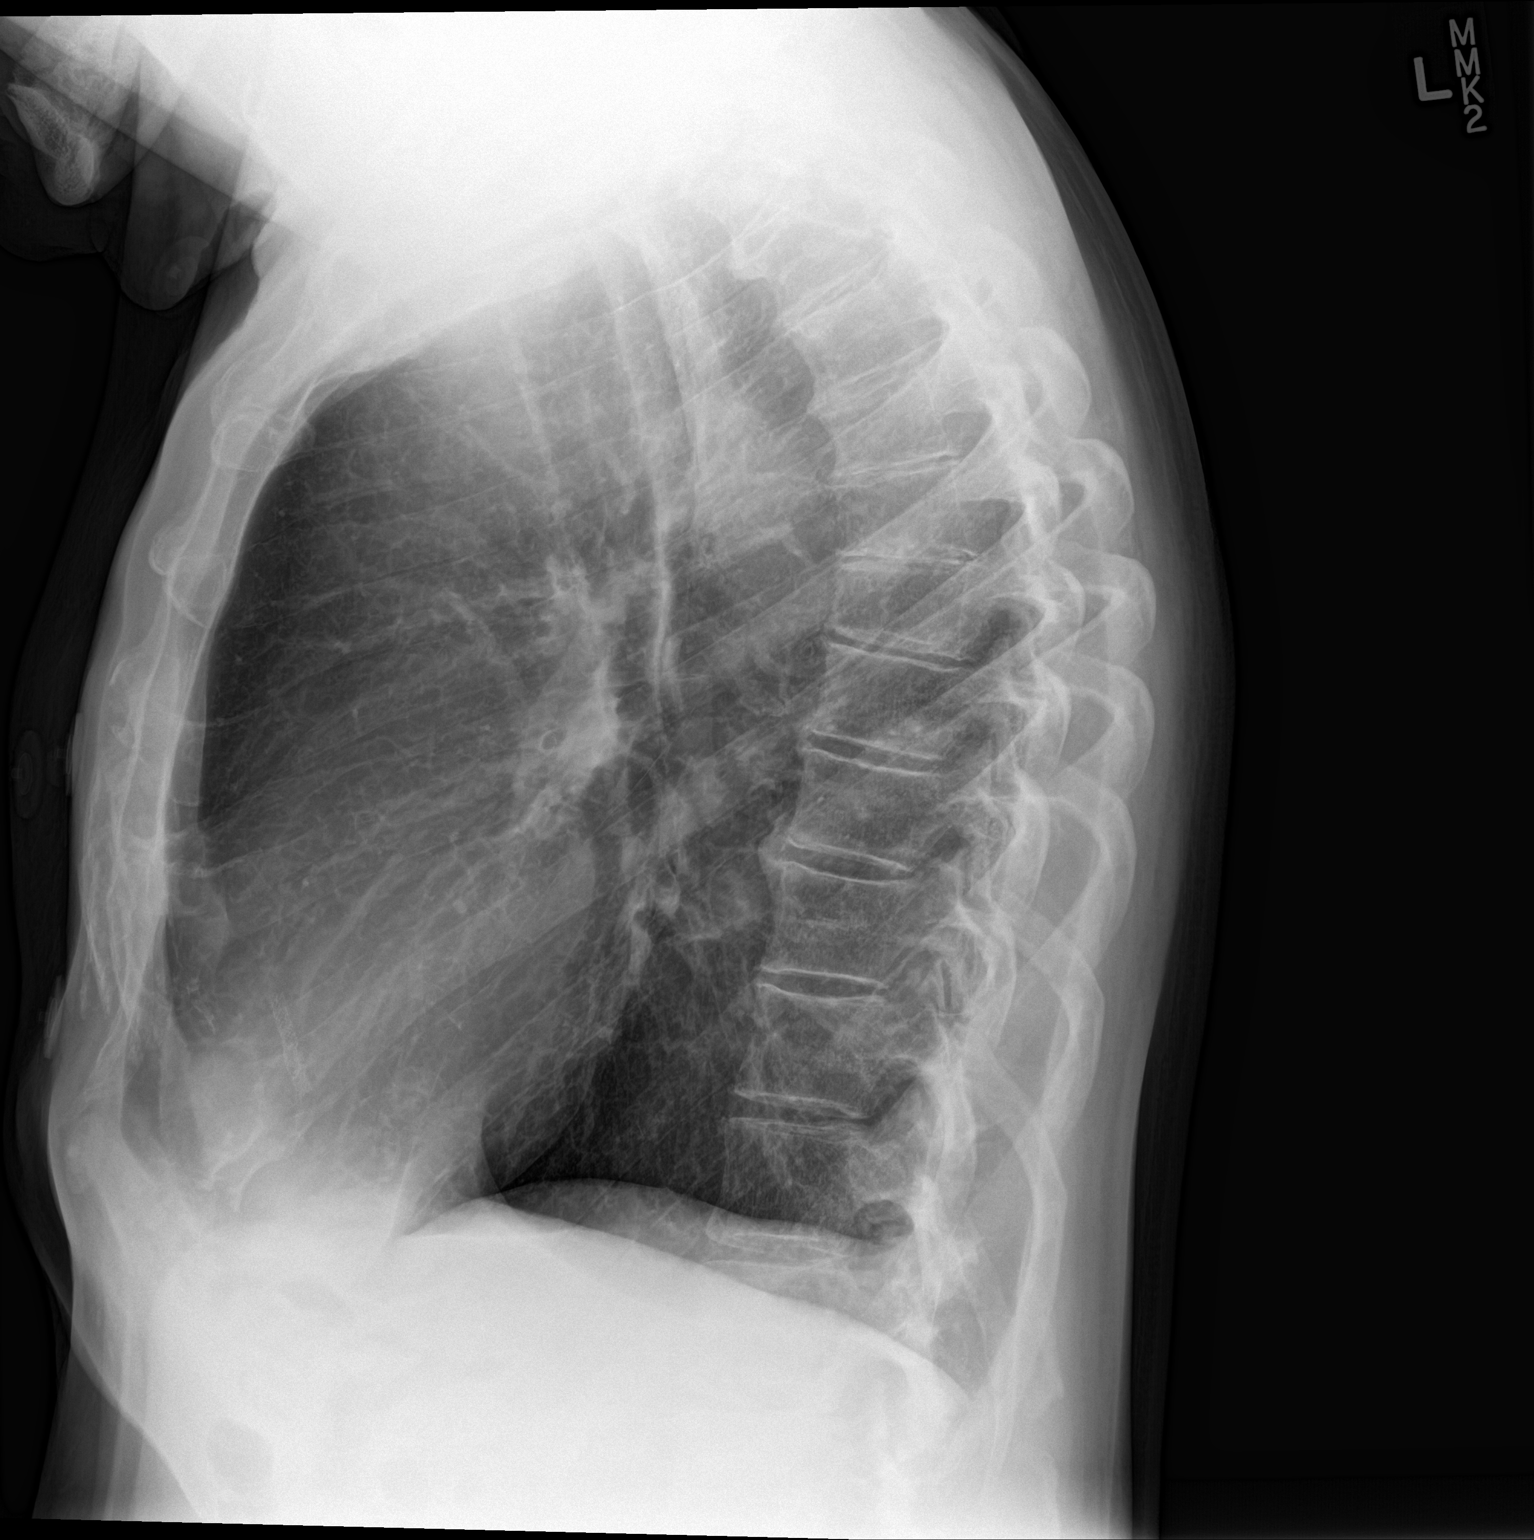

[2 of 2 positions shown; findings below may reference images not displayed]

FINDINGS: Two small nodular densities are seen at the left lung base, 1 of
which appears stable compared to an earlier chest x-ray of
04/13/2015. Suspect benign etiology, possibly focal nodular
atelectasis. Lungs otherwise clear. No pleural effusion seen. No
pneumothorax. Lungs are hyperexpanded suggesting some degree of
COPD.

Heart size is normal. Overall cardiomediastinal silhouette is stable
in size and configuration. Mild atherosclerotic changes noted at the
aortic arch.

Degenerative changes again noted at the right acromioclavicular
joint space, with prominent osseous spurring. Mild degenerative
change noted within the upper thoracic spine with associated mild
kyphosis. Osseous and soft tissue structures about the chest are
otherwise unremarkable.
IMPRESSION: 1. Lungs are hyperexpanded suggesting COPD.
2. Two small nodular densities are seen at the left lung base, only
well seen on the AP projection, 1 of which appears stable compared
to an earlier chest x-ray of 04/13/2015. I suspect this is a benign
finding, possibly nodular atelectasis. Would merely consider a
follow-up plain film examination in 2-3 months to ensure stability
or resolution. If persists or increases on subsequent chest x-ray, a
chest CT would then be needed for definitive characterization.
3. Lungs otherwise clear. No evidence of pneumonia. No pleural
effusion.

## 2016-05-03 ENCOUNTER — Telehealth: Payer: Self-pay | Admitting: *Deleted

## 2016-05-03 ENCOUNTER — Emergency Department (HOSPITAL_COMMUNITY)
Admission: EM | Admit: 2016-05-03 | Discharge: 2016-05-03 | Disposition: A | Discharge status: Home / Self Care | Payer: Medicare Other | Attending: Emergency Medicine | Admitting: Emergency Medicine

## 2016-05-03 ENCOUNTER — Encounter (HOSPITAL_COMMUNITY): Payer: Self-pay | Admitting: Emergency Medicine

## 2016-05-03 ENCOUNTER — Emergency Department (HOSPITAL_COMMUNITY): Payer: Medicare Other

## 2016-05-03 DIAGNOSIS — F17210 Nicotine dependence, cigarettes, uncomplicated: Secondary | ICD-10-CM | POA: Insufficient documentation

## 2016-05-03 DIAGNOSIS — I2510 Atherosclerotic heart disease of native coronary artery without angina pectoris: Secondary | ICD-10-CM | POA: Insufficient documentation

## 2016-05-03 DIAGNOSIS — R072 Precordial pain: Secondary | ICD-10-CM | POA: Diagnosis present

## 2016-05-03 DIAGNOSIS — Z79899 Other long term (current) drug therapy: Secondary | ICD-10-CM | POA: Diagnosis not present

## 2016-05-03 DIAGNOSIS — I252 Old myocardial infarction: Secondary | ICD-10-CM | POA: Diagnosis not present

## 2016-05-03 DIAGNOSIS — Z7982 Long term (current) use of aspirin: Secondary | ICD-10-CM | POA: Insufficient documentation

## 2016-05-03 DIAGNOSIS — Z7902 Long term (current) use of antithrombotics/antiplatelets: Secondary | ICD-10-CM | POA: Diagnosis not present

## 2016-05-03 DIAGNOSIS — M25561 Pain in right knee: Secondary | ICD-10-CM

## 2016-05-03 DIAGNOSIS — R079 Chest pain, unspecified: Secondary | ICD-10-CM

## 2016-05-03 LAB — BASIC METABOLIC PANEL
ANION GAP: 8 (ref 5–15)
BUN: 7 mg/dL (ref 6–20)
CHLORIDE: 105 mmol/L (ref 101–111)
CO2: 26 mmol/L (ref 22–32)
CREATININE: 0.94 mg/dL (ref 0.61–1.24)
Calcium: 9.3 mg/dL (ref 8.9–10.3)
GFR calc Af Amer: >60 mL/min (ref >60)
GFR calc non Af Amer: >60 mL/min (ref >60)
Glucose, Bld: 153 mg/dL — ABNORMAL HIGH (ref 65–99)
POTASSIUM: 4.7 mmol/L (ref 3.5–5.1)
SODIUM: 139 mmol/L (ref 135–145)

## 2016-05-03 LAB — CBC
HCT: 46.6 % (ref 39.0–52.0)
Hemoglobin: 15.9 g/dL (ref 13.0–17.0)
MCH: 33.5 pg (ref 26.0–34.0)
MCHC: 34.1 g/dL (ref 30.0–36.0)
MCV: 98.1 fL (ref 78.0–100.0)
PLATELETS: 227 10*3/uL (ref 150–400)
RBC: 4.75 MIL/uL (ref 4.22–5.81)
RDW: 13.1 % (ref 11.5–15.5)
WBC: 8.9 10*3/uL (ref 4.0–10.5)

## 2016-05-03 LAB — I-STAT TROPONIN, ED: Troponin i, poc: 0 ng/mL (ref 0.00–0.08)

## 2016-05-03 MED ORDER — HYDROCODONE-ACETAMINOPHEN 5-325 MG PO TABS: 1.0000 | ORAL_TABLET | ORAL | Status: DC | PRN

## 2016-05-03 NOTE — ED Notes (Signed)
Pt arrive via [facility] from home with substernal chest pain that began 2 hours ago. Pt reports hx of Mi with stents. Pt also c/o right hip pain. States currently homeless which isnt helping his hip pain. VSS.

## 2016-05-03 NOTE — ED Notes (Signed)
Pt. At xray- staff will return to room 43

## 2016-05-03 NOTE — Progress Notes (Signed)
CSW engaged with Patient at this bedside. CSW provided Patient with a list of local shelters, food pantries, and free food. Patient reports that he has obtained an apartment, but can't move in until the 1st of July. He reports that he had been staying at [facility] but was discharged due to length of stay. He reports that he was then staying at a hotel but it became too costly. CSW assisted Patient in calling several shelters, both of which report that they have bed openings. Patient agreeable to go to one of the two shelters. Patient reports needing money for a PART bus. CSW to staff with CSW supervisor re: request.     Lance MussAshley Gardner,MSW, LCSW [facility] ED/[facility] Clinical Social Worker [phone]

## 2016-05-03 NOTE — ED Provider Notes (Signed)
CSN: [id number]     Arrival date & time 05/03/16  1156 History   First MD Initiated Contact with Patient 05/03/16 1252     Chief Complaint  Patient presents with  . Chest Pain  . Hip Pain     (Consider location/radiation/quality/duration/timing/severity/associated sxs/prior Treatment) HPI Comments: Patient is a 65 year old male with past medical history of coronary artery disease with stent. He presents today for evaluation of pain in his chest and knee. The pain in his chest is been ongoing for well over a year. He had a stent placed in January, however does not appear that this helped. He denies any shortness of breath or nausea.  He also reports pain in his right knee in the absence of any injury or trauma. Patient reports that he is homeless and has been "camping out". He believes this may have aggravated his knee pain. He is requesting assistance to help him find a place to stay. He also reports that his assistance checks have been recently mixed up and he has not received them. He reports making arrangements for an apartment, however has no place to stay until these plans materialize.  Patient is a 65 y.o. male presenting with chest pain and hip pain. The history is provided by the patient.  Chest Pain Pain location:  Substernal area Pain quality: tightness   Pain radiates to:  Does not radiate Pain radiates to the back: no   Pain severity:  Moderate Onset quality:  Gradual Duration:  12 months Timing:  Constant Progression:  Unchanged Chronicity:  Chronic Relieved by:  Nothing Worsened by:  Nothing tried Ineffective treatments:  None tried Hip Pain Associated symptoms include chest pain.    Past Medical History  Diagnosis Date  . Osteoarthritis   . Coronary artery disease     a. Big NSTEMI 11/2014: cath with surgical disease but patient initially refused CABG. Compromise decision was made to do intervention on the RCA with a bare metal stent with medical therapy for other  disease and follow-up as an outpatient.  . Myocardial infarction [facility])     NSTEMI  . HOH (hard of hearing)   . Tobacco abuse   . Ischemic cardiomyopathy     a. a. Cath 12/22/14: EF 25%. b. 2D Echo 12/23/14: EF 25-30%, diffuse hypokinesis, akinesis of the entireinferolateral and inferior myocardium, mild MR.  [name] Hyperlipidemia   . Transaminitis    Past Surgical History  Procedure Laterality Date  . Left heart catheterization with coronary angiogram N/A 12/22/2014    Procedure: LEFT HEART CATHETERIZATION WITH CORONARY ANGIOGRAM;  Surgeon: Corky Crafts, MD; LAD 100%, CFX 90%, RI mild dz, RCA 95%/70%, EF 25%, CABG recommended  . Percutaneous coronary stent intervention (pci-s) N/A 12/23/2014    Procedure: PERCUTANEOUS CORONARY STENT INTERVENTION (PCI-S);  Surgeon: Corky Crafts, MD; 3.0 x 24 rebel BMS to the RCA    Family History  Problem Relation Age of Onset  . Hypertension    . Heart attack Neg Hx   . Stroke Neg Hx   . Cancer Father    Social History  Substance Use Topics  . Smoking status: Current Every Day Smoker -- 0.00 packs/day for 50 years    Types: Cigarettes  . Smokeless tobacco: Current User  . Alcohol Use: No    Review of Systems  Cardiovascular: Positive for chest pain.  All other systems reviewed and are negative.     Allergies  Penicillins and Ergocalciferol  Home Medications   Prior to  Admission medications   Medication Sig Start Date End Date Taking? Authorizing Provider  aspirin 81 MG tablet Take 81 mg by mouth daily.    Historical Provider, MD  atorvastatin (LIPITOR) 20 MG tablet Take 20 mg by mouth every morning.    Historical Provider, MD  HYDROcodone-acetaminophen (NORCO/VICODIN) 5-325 MG tablet TK 1 T PO TID 11/17/15   Historical Provider, MD  nitroGLYCERIN (NITROSTAT) 0.4 MG SL tablet Place 1 tablet (0.4 mg total) under the tongue every 5 (five) minutes as needed for chest pain. 12/24/14   Rhonda G Barrett, PA-C  prasugrel (EFFIENT) 10 MG  TABS tablet Take 1 tablet (10 mg total) by mouth daily. 11/08/15   Azalee Course, PA   BP 130/71 mmHg  Pulse 80  Temp(Src) 99.2 F (37.3 C) (Oral)  Resp 22  SpO2 97% Physical Exam  Constitutional: He is oriented to person, place, and time. He appears well-developed and well-nourished. No distress.  HENT:  Head: Normocephalic and atraumatic.  Mouth/Throat: Oropharynx is clear and moist.  Neck: Normal range of motion. Neck supple.  Cardiovascular: Normal rate and regular rhythm.  Exam reveals no friction rub.   No murmur heard. Pulmonary/Chest: Effort normal and breath sounds normal. No respiratory distress. He has no wheezes. He has no rales.  Abdominal: Soft. Bowel sounds are normal. He exhibits no distension. There is no tenderness.  Musculoskeletal: Normal range of motion. He exhibits no edema.  The right knee appears grossly normal. There is no effusion, redness, or warmth. He has good range of motion without crepitus. The knee is stable anteriorly/posteriorly and with varus and valgus stress.  Neurological: He is alert and oriented to person, place, and time. Coordination normal.  Skin: Skin is warm and dry. He is not diaphoretic.  Nursing note and vitals reviewed.   ED Course  Procedures (including critical care time) Labs Review Labs Reviewed  BASIC METABOLIC PANEL - Abnormal; Notable for the following:    Glucose, Bld 153 (*)    All other components within normal limits  CBC  I-STAT TROPOININ, ED    Imaging Review Dg Chest 2 View  05/03/2016  CLINICAL DATA:  65 year old male with chest pain. Shortness of breath. Smoker. Initial encounter. EXAM: CHEST  2 VIEW COMPARISON:  11/22/2015 and earlier. FINDINGS: Stable somewhat large lung volumes. Normal cardiac size and mediastinal contours. Visualized tracheal air column is within normal limits. Mild increased interstitial markings in both lungs appear stable with no pneumothorax, pulmonary edema, pleural effusion or confluent  pulmonary opacity. Chronic degenerative changes at the right acromioclavicular joint. No acute osseous abnormality identified. IMPRESSION: No acute cardiopulmonary abnormality. Electronically Signed   By: Odessa Fleming M.D.   On: 05/03/2016 12:56   I have personally reviewed and evaluated these images and lab results as part of my medical decision-making.   EKG Interpretation   Date/Time:  Wednesday May 03 2016 12:07:42 EDT Ventricular Rate:  90 PR Interval:  116 QRS Duration: 118 QT Interval:  400 QTC Calculation: 489 R Axis:   -63 Text Interpretation:  Normal sinus rhythm Left axis deviation Incomplete  right bundle branch block Septal infarct , age undetermined Abnormal ECG  agree no change from previous Confirmed by Donnald Garre, MD, Lebron Conners [phone]) on  05/03/2016 12:16:13 PM      MDM   Final diagnoses:  None    Patient is a 65 year old male with history of coronary artery disease with stent. He presents today for evaluation of pain in his right knee and chest discomfort  which he tells me his been ongoing for greater than 1 year. His cardiac workup reveals no elevation of troponin and an unchanged EKG. He is also complaining of knee pain that he feels is related to his "camping out", as he is currently homeless. His homelessness appears to be his greatest concern today and is requesting to speak with a Child psychotherapistsocial worker for assistance. The social worker came to speak with him and gave him referrals and information regarding homeless shelters which the patient is agreeable to look into. I highly doubt there is an acute cardiac problem. I also see nothing on the x-ray of his knee that appears acute. He does have some degenerative changes which can be followed up as needed. He will be provided with pain medication and advised to follow-up with primary Dr. in a timely fashion.    Geoffery Lyonsouglas Jaydalee Bardwell, MD 05/04/16 [phone]

## 2016-05-03 NOTE — Discharge Instructions (Signed)
Hydrocodone as prescribed as needed for pain.  Please follow-up with your primary Dr. In the next week.   Nonspecific Chest Pain  Chest pain can be caused by many different conditions. There is always a chance that your pain could be related to something serious, such as a heart attack or a blood clot in your lungs. Chest pain can also be caused by conditions that are not life-threatening. If you have chest pain, it is very important to follow up with your health care provider. CAUSES  Chest pain can be caused by:  Heartburn.  Pneumonia or bronchitis.  Anxiety or stress.  Inflammation around your heart (pericarditis) or lung (pleuritis or pleurisy).  A blood clot in your lung.  A collapsed lung (pneumothorax). It can develop suddenly on its own (spontaneous pneumothorax) or from trauma to the chest.  Shingles infection (varicella-zoster virus).  Heart attack.  Damage to the bones, muscles, and cartilage that make up your chest wall. This can include:  Bruised bones due to injury.  Strained muscles or cartilage due to frequent or repeated coughing or overwork.  Fracture to one or more ribs.  Sore cartilage due to inflammation (costochondritis). RISK FACTORS  Risk factors for chest pain may include:  Activities that increase your risk for trauma or injury to your chest.  Respiratory infections or conditions that cause frequent coughing.  Medical conditions or overeating that can cause heartburn.  Heart disease or family history of heart disease.  Conditions or health behaviors that increase your risk of developing a blood clot.  Having had chicken pox (varicella zoster). SIGNS AND SYMPTOMS Chest pain can feel like:  Burning or tingling on the surface of your chest or deep in your chest.  Crushing, pressure, aching, or squeezing pain.  Dull or sharp pain that is worse when you move, cough, or take a deep breath.  Pain that is also felt in your back, neck,  shoulder, or arm, or pain that spreads to any of these areas. Your chest pain may come and go, or it may stay constant. DIAGNOSIS Lab tests or other studies may be needed to find the cause of your pain. Your health care provider may have you take a test called an ambulatory ECG (electrocardiogram). An ECG records your heartbeat patterns at the time the test is performed. You may also have other tests, such as:  Transthoracic echocardiogram (TTE). During echocardiography, sound waves are used to create a picture of all of the heart structures and to look at how blood flows through your heart.  Transesophageal echocardiogram (TEE).This is a more advanced imaging test that obtains images from inside your body. It allows your health care provider to see your heart in finer detail.  Cardiac monitoring. This allows your health care provider to monitor your heart rate and rhythm in real time.  Holter monitor. This is a portable device that records your heartbeat and can help to diagnose abnormal heartbeats. It allows your health care provider to track your heart activity for several days, if needed.  Stress tests. These can be done through exercise or by taking medicine that makes your heart beat more quickly.  Blood tests.  Imaging tests. TREATMENT  Your treatment depends on what is causing your chest pain. Treatment may include:  Medicines. These may include:  Acid blockers for heartburn.  Anti-inflammatory medicine.  Pain medicine for inflammatory conditions.  Antibiotic medicine, if an infection is present.  Medicines to dissolve blood clots.  Medicines to treat coronary  artery disease.  Supportive care for conditions that do not require medicines. This may include:  Resting.  Applying heat or cold packs to injured areas.  Limiting activities until pain decreases. HOME CARE INSTRUCTIONS  If you were prescribed an antibiotic medicine, finish it all even if you start to feel  better.  Avoid any activities that bring on chest pain.  Do not use any tobacco products, including cigarettes, chewing tobacco, or electronic cigarettes. If you need help quitting, ask your health care provider.  Do not drink alcohol.  Take medicines only as directed by your health care provider.  Keep all follow-up visits as directed by your health care provider. This is important. This includes any further testing if your chest pain does not go away.  If heartburn is the cause for your chest pain, you may be told to keep your head raised (elevated) while sleeping. This reduces the chance that acid will go from your stomach into your esophagus.  Make lifestyle changes as directed by your health care provider. These may include:  Getting regular exercise. Ask your health care provider to suggest some activities that are safe for you.  Eating a heart-healthy diet. A registered dietitian can help you to learn healthy eating options.  Maintaining a healthy weight.  Managing diabetes, if necessary.  Reducing stress. SEEK MEDICAL CARE IF:  Your chest pain does not go away after treatment.  You have a rash with blisters on your chest.  You have a fever. SEEK IMMEDIATE MEDICAL CARE IF:   Your chest pain is worse.  You have an increasing cough, or you cough up blood.  You have severe abdominal pain.  You have severe weakness.  You faint.  You have chills.  You have sudden, unexplained chest discomfort.  You have sudden, unexplained discomfort in your arms, back, neck, or jaw.  You have shortness of breath at any time.  You suddenly start to sweat, or your skin gets clammy.  You feel nauseous or you vomit.  You suddenly feel light-headed or dizzy.  Your heart begins to beat quickly, or it feels like it is skipping beats. These symptoms may represent a serious problem that is an emergency. Do not wait to see if the symptoms will go away. Get medical help right away.  Call your local emergency services (911 in the [place] Do not drive yourself to the hospital.   This information is not intended to replace advice given to you by your health care provider. Make sure you discuss any questions you have with your health care provider.   Document Released: 08/23/2005 Document Revised: 12/04/2014 Document Reviewed: 06/19/2014 Elsevier Interactive Patient Education 2016 [employer]  Musculoskeletal Pain Musculoskeletal pain is muscle and boney aches and pains. These pains can occur in any part of the body. Your caregiver may treat you without knowing the cause of the pain. They may treat you if blood or urine tests, X-rays, and other tests were normal.  CAUSES There is often not a definite cause or reason for these pains. These pains may be caused by a type of germ (virus). The discomfort may also come from overuse. Overuse includes working out too hard when your body is not fit. Boney aches also come from weather changes. Bone is sensitive to atmospheric pressure changes. HOME CARE INSTRUCTIONS   Ask when your test results will be ready. Make sure you get your test results.  Only take over-the-counter or prescription medicines for pain, discomfort, or fever as  directed by your caregiver. If you were given medications for your condition, do not drive, operate machinery or power tools, or sign legal documents for 24 hours. Do not drink alcohol. Do not take sleeping pills or other medications that may interfere with treatment.  Continue all activities unless the activities cause more pain. When the pain lessens, slowly resume normal activities. Gradually increase the intensity and duration of the activities or exercise.  During periods of severe pain, bed rest may be helpful. Lay or sit in any position that is comfortable.  Putting ice on the injured area.  Put ice in a bag.  Place a towel between your skin and the bag.  Leave the ice on for 15 to 20 minutes, 3  to 4 times a day.  Follow up with your caregiver for continued problems and no reason can be found for the pain. If the pain becomes worse or does not go away, it may be necessary to repeat tests or do additional testing. Your caregiver may need to look further for a possible cause. SEEK IMMEDIATE MEDICAL CARE IF:  You have pain that is getting worse and is not relieved by medications.  You develop chest pain that is associated with shortness or breath, sweating, feeling sick to your stomach (nauseous), or throw up (vomit).  Your pain becomes localized to the abdomen.  You develop any new symptoms that seem different or that concern you. MAKE SURE YOU:   Understand these instructions.  Will watch your condition.  Will get help right away if you are not doing well or get worse.   This information is not intended to replace advice given to you by your health care provider. Make sure you discuss any questions you have with your health care provider.   Document Released: 11/13/2005 Document Revised: 02/05/2012 Document Reviewed: 07/18/2013 Elsevier Interactive Patient Education [employer]

## 2016-05-03 NOTE — ED Notes (Signed)
Placed patient on the monitor and in a gown 

## 2016-05-29 NOTE — Telephone Encounter (Signed)
[place] Production designer, theatre/television/filmmanager with [facility] trying to get in touch with patient about a apartment that has become available.Patient called left message on patient's personal voice mail to call her at [phone] and also call the office to schedule follow up appointment with Dr.Jordan.

## 2016-07-11 ENCOUNTER — Encounter (HOSPITAL_COMMUNITY): Payer: Self-pay | Admitting: Emergency Medicine

## 2016-07-11 ENCOUNTER — Emergency Department (HOSPITAL_COMMUNITY): Payer: Medicare (Managed Care)

## 2016-07-11 ENCOUNTER — Emergency Department (HOSPITAL_COMMUNITY)
Admission: EM | Admit: 2016-07-11 | Discharge: 2016-07-12 | Disposition: A | Discharge status: Home / Self Care | Payer: Medicare (Managed Care) | Attending: Emergency Medicine | Admitting: Emergency Medicine

## 2016-07-11 DIAGNOSIS — J40 Bronchitis, not specified as acute or chronic: Secondary | ICD-10-CM | POA: Diagnosis not present

## 2016-07-11 DIAGNOSIS — Z79899 Other long term (current) drug therapy: Secondary | ICD-10-CM | POA: Diagnosis not present

## 2016-07-11 DIAGNOSIS — I252 Old myocardial infarction: Secondary | ICD-10-CM | POA: Insufficient documentation

## 2016-07-11 DIAGNOSIS — I2510 Atherosclerotic heart disease of native coronary artery without angina pectoris: Secondary | ICD-10-CM | POA: Insufficient documentation

## 2016-07-11 DIAGNOSIS — Z7982 Long term (current) use of aspirin: Secondary | ICD-10-CM | POA: Diagnosis not present

## 2016-07-11 DIAGNOSIS — I5022 Chronic systolic (congestive) heart failure: Secondary | ICD-10-CM | POA: Insufficient documentation

## 2016-07-11 DIAGNOSIS — Z955 Presence of coronary angioplasty implant and graft: Secondary | ICD-10-CM | POA: Insufficient documentation

## 2016-07-11 DIAGNOSIS — R0789 Other chest pain: Secondary | ICD-10-CM | POA: Diagnosis present

## 2016-07-11 DIAGNOSIS — F17210 Nicotine dependence, cigarettes, uncomplicated: Secondary | ICD-10-CM | POA: Insufficient documentation

## 2016-07-11 LAB — COMPREHENSIVE METABOLIC PANEL
ALBUMIN: 4 g/dL (ref 3.5–5.0)
ALK PHOS: 70 U/L (ref 38–126)
ALT: 15 U/L — ABNORMAL LOW (ref 17–63)
AST: 19 U/L (ref 15–41)
Anion gap: 8 (ref 5–15)
BUN: 9 mg/dL (ref 6–20)
CALCIUM: 9.2 mg/dL (ref 8.9–10.3)
CO2: 26 mmol/L (ref 22–32)
Chloride: 103 mmol/L (ref 101–111)
Creatinine, Ser: 1.04 mg/dL (ref 0.61–1.24)
GFR calc Af Amer: >60 mL/min (ref >60)
GFR calc non Af Amer: >60 mL/min (ref >60)
GLUCOSE: 160 mg/dL — AB (ref 65–99)
Potassium: 3.7 mmol/L (ref 3.5–5.1)
Sodium: 137 mmol/L (ref 135–145)
Total Bilirubin: 0.8 mg/dL (ref 0.3–1.2)
Total Protein: 6.8 g/dL (ref 6.5–8.1)

## 2016-07-11 LAB — CBC WITH DIFFERENTIAL/PLATELET
BASOS ABS: 0 10*3/uL (ref 0.0–0.1)
BASOS PCT: 0 %
EOS PCT: 3 %
Eosinophils Absolute: 0.2 10*3/uL (ref 0.0–0.7)
HEMATOCRIT: 43.6 % (ref 39.0–52.0)
HEMOGLOBIN: 14.7 g/dL (ref 13.0–17.0)
LYMPHS PCT: 25 %
Lymphs Abs: 2.2 10*3/uL (ref 0.7–4.0)
MCH: 34.2 pg — ABNORMAL HIGH (ref 26.0–34.0)
MCHC: 33.7 g/dL (ref 30.0–36.0)
MCV: 101.4 fL — ABNORMAL HIGH (ref 78.0–100.0)
MONO ABS: 0.6 10*3/uL (ref 0.1–1.0)
MONOS PCT: 7 %
Neutro Abs: 5.8 10*3/uL (ref 1.7–7.7)
Neutrophils Relative %: 65 %
Platelets: 242 10*3/uL (ref 150–400)
RBC: 4.3 MIL/uL (ref 4.22–5.81)
RDW: 13.9 % (ref 11.5–15.5)
WBC: 8.8 10*3/uL (ref 4.0–10.5)

## 2016-07-11 LAB — I-STAT TROPONIN, ED: Troponin i, poc: 0.01 ng/mL (ref 0.00–0.08)

## 2016-07-11 MED ORDER — IPRATROPIUM BROMIDE 0.02 % IN SOLN
0.5000 mg | Freq: Once | RESPIRATORY_TRACT | Status: AC
  Administered 2016-07-12: 0.5 mg via RESPIRATORY_TRACT
  Filled 2016-07-11: qty 2.5

## 2016-07-11 MED ORDER — ALBUTEROL SULFATE (2.5 MG/3ML) 0.083% IN NEBU
5.0000 mg | INHALATION_SOLUTION | Freq: Once | RESPIRATORY_TRACT | Status: AC
  Administered 2016-07-12: 5 mg via RESPIRATORY_TRACT
  Filled 2016-07-11: qty 6

## 2016-07-11 NOTE — ED Provider Notes (Signed)
[facility]-EMERGENCY DEPT Provider Note   CSN: [id number] Arrival date & time: 07/11/16  2014     History   Chief Complaint Chief Complaint  Patient presents with  . Chest Pain    HPI Casey Jackson is a 65 y.o. male.  Casey Jackson is a 65 y.o. Male with a history of coronary artery disease, and STEMI, hyperlipidemia, and a smoker who presents to the emergency department complaining of cough worsening over the past 3 days. Patient reports he was diagnosed with pneumonia month and a half ago and reports his symptoms feel similar. He reports his coughing is worse lying down. He reports his pneumonia resolved and 3 days ago he began having worsening cough with productive green sputum. He reports he has been staying outside over the past 3 days as he is recently homeless. He is trying to get up [facility]. He tells me he has been coughing and has chest pain with coughing. He plans a slight dull pain constantly to his chest. He denies any shortness of breath or wheezing. He has taken nothing for treatment of his symptoms today. Patient denies fevers, hemoptysis, leg pain, leg swelling, palpitations, shortness of breath, wheezing, abdominal pain, lightheadedness, dizziness, syncope, nausea, vomiting or rashes.   The history is provided by the patient. No language interpreter was used.    Past Medical History:  Diagnosis Date  . Coronary artery disease    a. Big NSTEMI 11/2014: cath with surgical disease but patient initially refused CABG. Compromise decision was made to do intervention on the RCA with a bare metal stent with medical therapy for other disease and follow-up as an outpatient.  [name]. HOH (hard of hearing)   . Hyperlipidemia   . Ischemic cardiomyopathy    a. a. Cath 12/22/14: EF 25%. b. 2D Echo 12/23/14: EF 25-30%, diffuse hypokinesis, akinesis of the entireinferolateral and inferior myocardium, mild MR.  . Myocardial infarction [facility](HCC)    NSTEMI  . Osteoarthritis   . Tobacco abuse    . Transaminitis     Patient Active Problem List   Diagnosis Date Noted  . Pain in the chest   . Palpitations   . ACS (acute coronary syndrome) (HCC) 05/01/2015  . Chest pain 05/01/2015  . Chronic systolic CHF (congestive heart failure) (HCC) 04/27/2015  . Coronary artery disease   . Transaminitis   . Tobacco abuse   . HOH (hard of hearing)   . Osteoarthritis   . Hyperlipidemia LDL goal <70 12/24/2014  . Cardiomyopathy, ischemic 12/24/2014  . NSTEMI (non-ST elevated myocardial infarction) (HCC) 12/22/2014    Past Surgical History:  Procedure Laterality Date  . LEFT HEART CATHETERIZATION WITH CORONARY ANGIOGRAM N/A 12/22/2014   Procedure: LEFT HEART CATHETERIZATION WITH CORONARY ANGIOGRAM;  Surgeon: Corky CraftsJayadeep S Varanasi, MD; LAD 100%, CFX 90%, RI mild dz, RCA 95%/70%, EF 25%, CABG recommended  . PERCUTANEOUS CORONARY STENT INTERVENTION (PCI-S) N/A 12/23/2014   Procedure: PERCUTANEOUS CORONARY STENT INTERVENTION (PCI-S);  Surgeon: Corky CraftsJayadeep S Varanasi, MD; 3.0 x 24 rebel BMS to the RCA        Home Medications    Prior to Admission medications   Medication Sig Start Date End Date Taking? Authorizing Provider  aspirin 81 MG tablet Take 81 mg by mouth daily.   Yes Historical Provider, MD  atorvastatin (LIPITOR) 20 MG tablet Take 20 mg by mouth every morning.   Yes Historical Provider, MD  nitroGLYCERIN (NITROSTAT) 0.4 MG SL tablet Place 1 tablet (0.4 mg total) under the tongue  every 5 (five) minutes as needed for chest pain. 12/24/14  Yes Rhonda G Barrett, PA-C  azithromycin (ZITHROMAX) 250 MG tablet Take 1 tablet (250 mg total) by mouth daily. Take first 2 tablets together, then 1 every day until finished. 07/12/16   Everlene Farrier, PA-C  HYDROcodone-acetaminophen (NORCO) 5-325 MG tablet Take 1-2 tablets by mouth every 4 (four) hours as needed. Patient not taking: Reported on 07/11/2016 05/03/16   Geoffery Lyons, MD  prasugrel (EFFIENT) 10 MG TABS tablet Take 1 tablet (10 mg total) by  mouth daily. Patient not taking: Reported on 07/11/2016 11/08/15   Azalee Course, PA    Family History Family History  Problem Relation Age of Onset  . Cancer Father   . Hypertension    . Heart attack Neg Hx   . Stroke Neg Hx     Social History Social History  Substance Use Topics  . Smoking status: Current Every Day Smoker    Packs/day: 0.00    Years: 50.00    Types: Cigarettes  . Smokeless tobacco: Current User  . Alcohol use No     Allergies   Penicillins and Ergocalciferol   Review of Systems Review of Systems  Constitutional: Negative for chills and fever.  HENT: Negative for congestion and sore throat.   Eyes: Negative for visual disturbance.  Respiratory: Positive for cough and chest tightness. Negative for shortness of breath and wheezing.   Cardiovascular: Positive for chest pain. Negative for palpitations and leg swelling.  Gastrointestinal: Negative for abdominal pain, nausea and vomiting.  Genitourinary: Negative for dysuria.  Musculoskeletal: Negative for back pain and neck pain.  Skin: Negative for rash.  Neurological: Negative for syncope, weakness, light-headedness and headaches.     Physical Exam Updated Vital Signs BP 118/71 (BP Location: Right Arm)   Pulse 60   Temp 98 F (36.7 C) (Oral)   Resp 16   Ht 5\' 11"  (1.803 m)   Wt 81.6 kg   SpO2 98%   BMI 25.10 kg/m   Physical Exam  Constitutional: He appears well-developed and well-nourished. No distress.  Nontoxic appearing.  HENT:  Head: Normocephalic and atraumatic.  Right Ear: External ear normal.  Left Ear: External ear normal.  Mouth/Throat: Oropharynx is clear and moist.  Boggy nasal turbinates bilaterally.  Eyes: Conjunctivae are normal. Pupils are equal, round, and reactive to light. Right eye exhibits no discharge. Left eye exhibits no discharge.  Neck: Normal range of motion. Neck supple. No JVD present. No tracheal deviation present.  Cardiovascular: Normal rate, regular rhythm,  normal heart sounds and intact distal pulses.  Exam reveals no gallop and no friction rub.   No murmur heard. Bilateral radial pulses are intact. Good capillary refill.  Pulmonary/Chest: Effort normal. No stridor. No respiratory distress. He has no wheezes. He has no rales.  Slight crackles noted bilaterally. No rales or rhonchi. No increased work of breathing.  Abdominal: Soft. There is no tenderness.  Musculoskeletal: He exhibits no edema or tenderness.  No lower extremity edema or tenderness.  Lymphadenopathy:    He has no cervical adenopathy.  Neurological: He is alert. Coordination normal.  Skin: Skin is warm and dry. Capillary refill takes less than 2 seconds. No rash noted. He is not diaphoretic. No erythema. No pallor.  Psychiatric: He has a normal mood and affect. His behavior is normal.  Nursing note and vitals reviewed.    ED Treatments / Results  Labs (all labs ordered are listed, but only abnormal results are displayed) Labs  Reviewed  CBC WITH DIFFERENTIAL/PLATELET - Abnormal; Notable for the following:       Result Value   MCV 101.4 (*)    MCH 34.2 (*)    All other components within normal limits  COMPREHENSIVE METABOLIC PANEL - Abnormal; Notable for the following:    Glucose, Bld 160 (*)    ALT 15 (*)    All other components within normal limits  I-STAT TROPOININ, ED    EKG  EKG Interpretation  Date/Time:  Tuesday July 11 2016 20:47:35 EDT Ventricular Rate:  70 PR Interval:  128 QRS Duration: 116 QT Interval:  416 QTC Calculation: 449 R Axis:   -54 Text Interpretation:  Normal sinus rhythm Left axis deviation Pulmonary disease pattern Incomplete right bundle branch block Septal infarct , age undetermined ST & T wave abnormality, consider lateral ischemia Abnormal ECG agree. no STEMI. Confirmed by Donnald Garre, MD, Lebron Conners [phone]) on 07/11/2016 11:35:48 PM       Radiology Dg Chest 2 View  Result Date: 07/11/2016 CLINICAL DATA:  Chest pain starting today.  Cough for 3 days. Diagnosed with bronchitis 1 month ago. Left lower lung pneumonia 3 weeks ago. EXAM: CHEST  2 VIEW COMPARISON:  05/03/2016 FINDINGS: Mild emphysematous changes in the lungs. No focal airspace disease or consolidation. 8 mm nodular opacity in the left mid lung. Given smoking history, may consider follow-up CT for further evaluation. No blunting of costophrenic angles. No pneumothorax. Normal heart size and pulmonary vascularity. Mediastinal contours appear intact. Degenerative changes in the spine. IMPRESSION: Emphysematous changes in the lungs. 8 mm nodule in the left mid lung. No focal consolidation. Electronically Signed   By: Burman Nieves M.D.   On: 07/11/2016 22:08    Procedures Procedures (including critical care time)  Medications Ordered in ED Medications  albuterol (PROVENTIL) (2.5 MG/3ML) 0.083% nebulizer solution 5 mg (5 mg Nebulization Given 07/12/16 0004)  ipratropium (ATROVENT) nebulizer solution 0.5 mg (0.5 mg Nebulization Given 07/12/16 0004)  albuterol (PROVENTIL HFA;VENTOLIN HFA) 108 (90 Base) MCG/ACT inhaler 2 puff (2 puffs Inhalation Given 07/12/16 0126)     Initial Impression / Assessment and Plan / ED Course  I have reviewed the triage vital signs and the nursing notes.  Pertinent labs & imaging results that were available during my care of the patient were reviewed by me and considered in my medical decision making (see chart for details).  Clinical Course   Patient presented to the emergency department worsening productive cough. He reports he has pain in his chest only with coughing. No chest pain otherwise. On exam the patient is afebrile nontoxic appearing. He does have some slight crackles noted bilateral bases. No increased work of breathing. No hypoxia. EKG shows no STEMI. Troponin is not elevated. Chest x-ray shows emphysematous changes. I have low suspicion for ACS in this patient at this time. He is complaining of only pain in his chest with  coughing. Patient with bronchitis. Will discharge with azithromycin and albuterol inhaler. I encouraged close follow-up by [facility] Center. I advised the patient to follow-up with their primary care provider this week. I advised the patient to return to the emergency department with new or worsening symptoms or new concerns. The patient verbalized understanding and agreement with plan.   This patient was discussed with and evaluated by Dr. Donnald Garre who agrees with assessment and plan.  Final Clinical Impressions(s) / ED Diagnoses   Final diagnoses:  Bronchitis    New Prescriptions Discharge Medication List as of 07/12/2016 12:54 AM  START taking these medications   Details  azithromycin (ZITHROMAX) 250 MG tablet Take 1 tablet (250 mg total) by mouth daily. Take first 2 tablets together, then 1 every day until finished., Starting Wed 07/12/2016, Print         Everlene FarrierWilliam Advika Mclelland, PA-C 07/12/16 1627    Arby BarretteMarcy Pfeiffer, MD 08/15/16 [phone]

## 2016-07-11 NOTE — ED Triage Notes (Signed)
Pt c/o left chest tightness onset while eating tonight.  Ptg denies nausea, vomiting, shortness of breath or diaphoresis

## 2016-07-11 NOTE — ED Notes (Signed)
Pt reports he is homeless currently and "sleeping on the ground is making me sick again"

## 2016-07-11 NOTE — ED Notes (Signed)
Patient transported to X-ray 

## 2016-07-12 ENCOUNTER — Telehealth: Payer: Self-pay | Admitting: *Deleted

## 2016-07-12 ENCOUNTER — Emergency Department (HOSPITAL_COMMUNITY)
Admission: EM | Admit: 2016-07-12 | Discharge: 2016-07-13 | Disposition: A | Discharge status: Home / Self Care | Payer: Medicare (Managed Care) | Attending: Emergency Medicine | Admitting: Emergency Medicine

## 2016-07-12 ENCOUNTER — Encounter (HOSPITAL_COMMUNITY): Payer: Self-pay | Admitting: Emergency Medicine

## 2016-07-12 ENCOUNTER — Emergency Department (HOSPITAL_COMMUNITY): Payer: Medicare (Managed Care)

## 2016-07-12 DIAGNOSIS — Z7982 Long term (current) use of aspirin: Secondary | ICD-10-CM | POA: Insufficient documentation

## 2016-07-12 DIAGNOSIS — I2510 Atherosclerotic heart disease of native coronary artery without angina pectoris: Secondary | ICD-10-CM | POA: Insufficient documentation

## 2016-07-12 DIAGNOSIS — I252 Old myocardial infarction: Secondary | ICD-10-CM | POA: Insufficient documentation

## 2016-07-12 DIAGNOSIS — Z951 Presence of aortocoronary bypass graft: Secondary | ICD-10-CM | POA: Insufficient documentation

## 2016-07-12 DIAGNOSIS — G8929 Other chronic pain: Secondary | ICD-10-CM | POA: Diagnosis not present

## 2016-07-12 DIAGNOSIS — I5022 Chronic systolic (congestive) heart failure: Secondary | ICD-10-CM | POA: Diagnosis not present

## 2016-07-12 DIAGNOSIS — F17210 Nicotine dependence, cigarettes, uncomplicated: Secondary | ICD-10-CM | POA: Diagnosis not present

## 2016-07-12 DIAGNOSIS — M25561 Pain in right knee: Secondary | ICD-10-CM | POA: Insufficient documentation

## 2016-07-12 MED ORDER — AZITHROMYCIN 250 MG PO TABS: 250.0000 mg | ORAL_TABLET | Freq: Every day | ORAL | 0 refills | Status: DC

## 2016-07-12 MED ORDER — ALBUTEROL SULFATE HFA 108 (90 BASE) MCG/ACT IN AERS
2.0000 | INHALATION_SPRAY | Freq: Once | RESPIRATORY_TRACT | Status: AC
  Administered 2016-07-12: 2 via RESPIRATORY_TRACT
  Filled 2016-07-12: qty 6.7

## 2016-07-12 NOTE — ED Notes (Signed)
Pt departed in NAD, refused use of wheelchair.  

## 2016-07-12 NOTE — ED Triage Notes (Signed)
Pt. reports worsening right knee pain radiating to right hip when walking onset yesterday , denies injury /ambulatory .

## 2016-07-13 MED ORDER — ACETAMINOPHEN 325 MG PO TABS
650.0000 mg | ORAL_TABLET | Freq: Once | ORAL | Status: AC
Start: 2016-07-13 — End: 2016-07-13
  Administered 2016-07-13: 650 mg via ORAL
  Filled 2016-07-13: qty 2

## 2016-07-13 MED ORDER — ACETAMINOPHEN 500 MG PO TABS: 500.0000 mg | ORAL_TABLET | Freq: Four times a day (QID) | ORAL | 0 refills | Status: AC | PRN

## 2016-07-13 MED ORDER — IBUPROFEN 400 MG PO TABS
400.0000 mg | ORAL_TABLET | Freq: Once | ORAL | Status: AC
  Administered 2016-07-13: 400 mg via ORAL
  Filled 2016-07-13: qty 1

## 2016-07-13 MED ORDER — IBUPROFEN 400 MG PO TABS: 400.0000 mg | ORAL_TABLET | Freq: Four times a day (QID) | ORAL | 0 refills | Status: DC | PRN

## 2016-07-13 NOTE — ED Notes (Signed)
Patient is A&Ox4 at this time.  Patient in no signs of distress.  Please see providers note for complete history and physical exam.  

## 2016-07-13 NOTE — ED Notes (Signed)
Patient Alert and oriented X4. Stable and ambulatory. Patient verbalized understanding of the discharge instructions.  Patient belongings were taken by the patient.  

## 2016-07-13 NOTE — ED Provider Notes (Signed)
[facility] DEPT Provider Note   CSN: [id number] Arrival date & time: 07/12/16  2318     History   Chief Complaint Chief Complaint  Patient presents with  . Knee Pain    HPI Casey Jackson is a 65 y.o. male.  HPI Casey Jackson is a 65 y.o. male with PMH significant for HLD, CAD, OA who presents with Exacerbation of chronic right knee pain. He states that it became worse yesterday. He denies any injury or trauma. He has not taken anything for his symptoms. He denies any swelling, numbness, weakness. He is ambulatory. No modifying factors. States he has seen orthopedist in the past for the same symptoms.  Past Medical History:  Diagnosis Date  . Coronary artery disease    a. Big NSTEMI 11/2014: cath with surgical disease but patient initially refused CABG. Compromise decision was made to do intervention on the RCA with a bare metal stent with medical therapy for other disease and follow-up as an outpatient.  [name]. HOH (hard of hearing)   . Hyperlipidemia   . Ischemic cardiomyopathy    a. a. Cath 12/22/14: EF 25%. b. 2D Echo 12/23/14: EF 25-30%, diffuse hypokinesis, akinesis of the entireinferolateral and inferior myocardium, mild MR.  . Myocardial infarction [facility](HCC)    NSTEMI  . Osteoarthritis   . Tobacco abuse   . Transaminitis     Patient Active Problem List   Diagnosis Date Noted  . Pain in the chest   . Palpitations   . ACS (acute coronary syndrome) (HCC) 05/01/2015  . Chest pain 05/01/2015  . Chronic systolic CHF (congestive heart failure) (HCC) 04/27/2015  . Coronary artery disease   . Transaminitis   . Tobacco abuse   . HOH (hard of hearing)   . Osteoarthritis   . Hyperlipidemia LDL goal <70 12/24/2014  . Cardiomyopathy, ischemic 12/24/2014  . NSTEMI (non-ST elevated myocardial infarction) (HCC) 12/22/2014    Past Surgical History:  Procedure Laterality Date  . LEFT HEART CATHETERIZATION WITH CORONARY ANGIOGRAM N/A 12/22/2014   Procedure: LEFT HEART  CATHETERIZATION WITH CORONARY ANGIOGRAM;  Surgeon: Corky CraftsJayadeep S Varanasi, MD; LAD 100%, CFX 90%, RI mild dz, RCA 95%/70%, EF 25%, CABG recommended  . PERCUTANEOUS CORONARY STENT INTERVENTION (PCI-S) N/A 12/23/2014   Procedure: PERCUTANEOUS CORONARY STENT INTERVENTION (PCI-S);  Surgeon: Corky CraftsJayadeep S Varanasi, MD; 3.0 x 24 rebel BMS to the RCA        Home Medications    Prior to Admission medications   Medication Sig Start Date End Date Taking? Authorizing Provider  acetaminophen (TYLENOL) 500 MG tablet Take 1 tablet (500 mg total) by mouth every 6 (six) hours as needed. 07/13/16   Cheri FowlerKayla Derin Matthes, PA-C  aspirin 81 MG tablet Take 81 mg by mouth daily.    Historical Provider, MD  atorvastatin (LIPITOR) 20 MG tablet Take 20 mg by mouth every morning.    Historical Provider, MD  azithromycin (ZITHROMAX) 250 MG tablet Take 1 tablet (250 mg total) by mouth daily. Take first 2 tablets together, then 1 every day until finished. 07/12/16   Everlene FarrierWilliam Dansie, PA-C  HYDROcodone-acetaminophen (NORCO) 5-325 MG tablet Take 1-2 tablets by mouth every 4 (four) hours as needed. Patient not taking: Reported on 07/11/2016 05/03/16   Geoffery Lyonsouglas Delo, MD  ibuprofen (ADVIL,MOTRIN) 400 MG tablet Take 1 tablet (400 mg total) by mouth every 6 (six) hours as needed. 07/13/16   Cheri FowlerKayla Karon Cotterill, PA-C  nitroGLYCERIN (NITROSTAT) 0.4 MG SL tablet Place 1 tablet (0.4 mg total) under the tongue every 5 (five)  minutes as needed for chest pain. 12/24/14   Rhonda G Barrett, PA-C  prasugrel (EFFIENT) 10 MG TABS tablet Take 1 tablet (10 mg total) by mouth daily. Patient not taking: Reported on 07/11/2016 11/08/15   Azalee CourseHao Meng, PA    Family History Family History  Problem Relation Age of Onset  . Cancer Father   . Hypertension    . Heart attack Neg Hx   . Stroke Neg Hx     Social History Social History  Substance Use Topics  . Smoking status: Current Every Day Smoker    Packs/day: 0.00    Years: 50.00    Types: Cigarettes  . Smokeless tobacco:  Current User  . Alcohol use No     Allergies   Penicillins and Ergocalciferol   Review of Systems Review of Systems All other systems negative unless otherwise stated in HPI   Physical Exam Updated Vital Signs BP 138/78 (BP Location: Right Arm)   Pulse 91   Resp 18   Ht 5\' 11"  (1.803 m)   Wt 83.5 kg   SpO2 98%   BMI 25.66 kg/m   Physical Exam  Constitutional: He is oriented to person, place, and time. He appears well-developed and well-nourished.  HENT:  Head: Normocephalic and atraumatic.  Right Ear: External ear normal.  Left Ear: External ear normal.  Eyes: Conjunctivae are normal. No scleral icterus.  Neck: No tracheal deviation present.  Cardiovascular:  Pulses:      Posterior tibial pulses are 2+ on the right side, and 2+ on the left side.  Pulmonary/Chest: Effort normal. No respiratory distress.  Abdominal: He exhibits no distension.  Musculoskeletal: Normal range of motion. He exhibits no tenderness.  Right knee: No swelling, erythema, deformity, or ecchymoses. Nontender to palpation. Full active range of motion without pain.  Neurological: He is alert and oriented to person, place, and time.  Normal strength and sensation.  Skin: Skin is warm and dry.  Psychiatric: He has a normal mood and affect. His behavior is normal.     ED Treatments / Results  Labs (all labs ordered are listed, but only abnormal results are displayed) Labs Reviewed - No data to display  EKG  EKG Interpretation None       Radiology Dg Chest 2 View  Result Date: 07/11/2016 CLINICAL DATA:  Chest pain starting today. Cough for 3 days. Diagnosed with bronchitis 1 month ago. Left lower lung pneumonia 3 weeks ago. EXAM: CHEST  2 VIEW COMPARISON:  05/03/2016 FINDINGS: Mild emphysematous changes in the lungs. No focal airspace disease or consolidation. 8 mm nodular opacity in the left mid lung. Given smoking history, may consider follow-up CT for further evaluation. No blunting of  costophrenic angles. No pneumothorax. Normal heart size and pulmonary vascularity. Mediastinal contours appear intact. Degenerative changes in the spine. IMPRESSION: Emphysematous changes in the lungs. 8 mm nodule in the left mid lung. No focal consolidation. Electronically Signed   By: Burman NievesWilliam  Stevens M.D.   On: 07/11/2016 22:08   Dg Knee Complete 4 Views Right  Result Date: 07/13/2016 CLINICAL DATA:  Right knee pain and swelling for 3 days. No known injury. EXAM: RIGHT KNEE - COMPLETE 4+ VIEW COMPARISON:  05/03/2016 FINDINGS: No evidence of fracture, dislocation, or joint effusion. No evidence of arthropathy or other focal bone abnormality. Soft tissues are unremarkable. Vascular calcifications. IMPRESSION: No acute bony abnormalities. Electronically Signed   By: Burman NievesWilliam  Stevens M.D.   On: 07/13/2016 00:03    Procedures Procedures (including critical  care time)  Medications Ordered in ED Medications  acetaminophen (TYLENOL) tablet 650 mg (not administered)  ibuprofen (ADVIL,MOTRIN) tablet 400 mg (not administered)     Initial Impression / Assessment and Plan / ED Course  I have reviewed the triage vital signs and the nursing notes.  Pertinent labs & imaging results that were available during my care of the patient were reviewed by me and considered in my medical decision making (see chart for details).  Clinical Course   Patient X-Ray negative for obvious fracture or dislocation.  Pt advised to follow up with orthopedics. Patient given Tylenol and Ibuprofen while in ED, conservative therapy recommended and discussed. Patient will be discharged home & is agreeable with above plan. Returns precautions discussed. Pt appears safe for discharge.    Final Clinical Impressions(s) / ED Diagnoses   Final diagnoses:  Knee pain, right    New Prescriptions New Prescriptions   ACETAMINOPHEN (TYLENOL) 500 MG TABLET    Take 1 tablet (500 mg total) by mouth every 6 (six) hours as needed.    IBUPROFEN (ADVIL,MOTRIN) 400 MG TABLET    Take 1 tablet (400 mg total) by mouth every 6 (six) hours as needed.     Cheri Fowler, PA-C 07/13/16 0026    Layla Maw Ward, DO 07/13/16 1610

## 2016-09-27 ENCOUNTER — Encounter (HOSPITAL_COMMUNITY): Payer: Self-pay

## 2016-09-27 ENCOUNTER — Emergency Department (HOSPITAL_COMMUNITY)
Admission: EM | Admit: 2016-09-27 | Discharge: 2016-09-27 | Disposition: A | Discharge status: Home / Self Care | Payer: Medicare (Managed Care) | Attending: Emergency Medicine | Admitting: Emergency Medicine

## 2016-09-27 ENCOUNTER — Emergency Department (HOSPITAL_COMMUNITY): Payer: Medicare Other | Attending: Emergency Medicine

## 2016-09-27 DIAGNOSIS — R0789 Other chest pain: Secondary | ICD-10-CM

## 2016-09-27 DIAGNOSIS — R079 Chest pain, unspecified: Secondary | ICD-10-CM

## 2016-09-27 DIAGNOSIS — Z7901 Long term (current) use of anticoagulants: Secondary | ICD-10-CM | POA: Diagnosis not present

## 2016-09-27 DIAGNOSIS — I5022 Chronic systolic (congestive) heart failure: Secondary | ICD-10-CM | POA: Insufficient documentation

## 2016-09-27 DIAGNOSIS — I2510 Atherosclerotic heart disease of native coronary artery without angina pectoris: Secondary | ICD-10-CM

## 2016-09-27 DIAGNOSIS — F17210 Nicotine dependence, cigarettes, uncomplicated: Secondary | ICD-10-CM | POA: Insufficient documentation

## 2016-09-27 DIAGNOSIS — R072 Precordial pain: Secondary | ICD-10-CM

## 2016-09-27 DIAGNOSIS — Z955 Presence of coronary angioplasty implant and graft: Secondary | ICD-10-CM | POA: Insufficient documentation

## 2016-09-27 DIAGNOSIS — I252 Old myocardial infarction: Secondary | ICD-10-CM | POA: Diagnosis not present

## 2016-09-27 DIAGNOSIS — Z7982 Long term (current) use of aspirin: Secondary | ICD-10-CM | POA: Diagnosis not present

## 2016-09-27 LAB — BASIC METABOLIC PANEL
Anion gap: 7 (ref 5–15)
BUN: 8 mg/dL (ref 6–20)
CO2: 27 mmol/L (ref 22–32)
Calcium: 9.3 mg/dL (ref 8.9–10.3)
Chloride: 106 mmol/L (ref 101–111)
Creatinine, Ser: 0.88 mg/dL (ref 0.61–1.24)
GFR calc Af Amer: >60 mL/min (ref >60)
GLUCOSE: 77 mg/dL (ref 65–99)
POTASSIUM: 4.4 mmol/L (ref 3.5–5.1)
Sodium: 140 mmol/L (ref 135–145)

## 2016-09-27 LAB — CBC
HEMATOCRIT: 46 % (ref 39.0–52.0)
Hemoglobin: 15.9 g/dL (ref 13.0–17.0)
MCH: 33.7 pg (ref 26.0–34.0)
MCHC: 34.6 g/dL (ref 30.0–36.0)
MCV: 97.5 fL (ref 78.0–100.0)
Platelets: 214 10*3/uL (ref 150–400)
RBC: 4.72 MIL/uL (ref 4.22–5.81)
RDW: 12.8 % (ref 11.5–15.5)
WBC: 8.6 10*3/uL (ref 4.0–10.5)

## 2016-09-27 LAB — I-STAT TROPONIN, ED
Troponin i, poc: 0.01 ng/mL (ref 0.00–0.08)
Troponin i, poc: 0.01 ng/mL (ref 0.00–0.08)

## 2016-09-27 MED ORDER — ASPIRIN 81 MG PO CHEW
324.0000 mg | CHEWABLE_TABLET | Freq: Once | ORAL | Status: AC
  Administered 2016-09-27: 324 mg via ORAL
  Filled 2016-09-27: qty 4

## 2016-09-27 MED ORDER — ATORVASTATIN CALCIUM 20 MG PO TABS: 20.0000 mg | ORAL_TABLET | Freq: Every day | ORAL | 1 refills | Status: DC

## 2016-09-27 NOTE — ED Notes (Signed)
cardiology NP at bedside  

## 2016-09-27 NOTE — ED Notes (Signed)
Pt wishes to NOT have have an IV at this time unless absolutely necessary. PT advised we can wait until/ if he needs one but normally with his history and c/o we would have one in case of emergency. Pt still wishes to wait.

## 2016-09-27 NOTE — ED Triage Notes (Signed)
Patient here with dull chest pain x 2 days, denies any associated symptoms. States that he thinks related to running out of his cholesterol meds, NAD. Alert and oriented

## 2016-09-27 NOTE — Consult Note (Signed)
Cardiology Consult    Patient ID: Casey Jackson MRN: [id number], DOB/AGE: 1951-10-17   Admit date: 09/27/2016 Date of Consult: 09/27/2016  Primary Physician: Dorrene German, MD Primary Cardiologist: Dr. Swaziland Requesting Provider: Dr. Ranae Palms Reason for Consultation: Chest pain  Patient Profile    65 yo male with PMH of 3v CAD, HTN, HLD, tobacco use who presented to the [facility] ED with reports of intermittent dull chest discomfort for the past 2 days.   Past Medical History   Past Medical History:  Diagnosis Date  . Coronary artery disease    a. Big NSTEMI 11/2014: cath with surgical disease but patient initially refused CABG. Compromise decision was made to do intervention on the RCA with a bare metal stent with medical therapy for other disease and follow-up as an outpatient.  [name] HOH (hard of hearing)   . Hyperlipidemia   . Ischemic cardiomyopathy    a. a. Cath 12/22/14: EF 25%. b. 2D Echo 12/23/14: EF 25-30%, diffuse hypokinesis, akinesis of the entireinferolateral and inferior myocardium, mild MR.  . Myocardial infarction    NSTEMI  . Osteoarthritis   . Tobacco abuse   . Transaminitis     Past Surgical History:  Procedure Laterality Date  . LEFT HEART CATHETERIZATION WITH CORONARY ANGIOGRAM N/A 12/22/2014   Procedure: LEFT HEART CATHETERIZATION WITH CORONARY ANGIOGRAM;  Surgeon: Corky Crafts, MD; LAD 100%, CFX 90%, RI mild dz, RCA 95%/70%, EF 25%, CABG recommended  . PERCUTANEOUS CORONARY STENT INTERVENTION (PCI-S) N/A 12/23/2014   Procedure: PERCUTANEOUS CORONARY STENT INTERVENTION (PCI-S);  Surgeon: Corky Crafts, MD; 3.0 x 24 rebel BMS to the RCA      Allergies  Allergies  Allergen Reactions  . Penicillins Swelling and Other (See Comments)    Has patient had a PCN reaction causing immediate rash, facial/tongue/throat swelling, SOB or lightheadedness with hypotension: YES Has patient had a PCN reaction causing severe rash involving mucus membranes or  skin necrosis: YES Has patient had a PCN reaction that required hospitalization NO, REACTION OCCURRED IN MD OFFICE Has patient had a PCN reaction occurring within the last 10 years: NO 1968 If all of the above answers are "NO", then may proceed with Cephalosporin use.   . Ergocalciferol Palpitations    History of Present Illness    Casey Jackson is a 65 yo male patient of Dr. Swaziland who presented as a NSTEMI in January 2016. He had a cardiac catheterization that time that demonstrated three-vessel CAD, and CABG was recommended. However the patient refused at that time and was taken back for repeat cath and underwent PCI of RCA with bare-metal stent placed. Echocardiogram post cath demonstrated reduced EF of 25-30%. A LifeVest was recommended but the patient declined. He was maintained on aspirin and Effient. Had been tried on several beta blockers including diastolic, Coreg and Toprol but was unable to tolerate with reported symptoms of chest pain, dizziness, and lightheadedness. He was initially intolerant to statins, but was later started on Lipitor 20 mg by his PCP and tolerated well. Initially plan was for him to return in July 2016 to have CABG and was scheduled to do so, but he changed his mind as he had no further symptoms.  He was last seen in the office by Dr. Swaziland on 12/14/15 where he reported doing well. States he had reduced his tobacco use to 2 cigarettes a day. His Effient was stopped in Feb this year as he was a year out from his stent.  Reports  he has been in his usual state of health, but ran out of his statin 2 weeks ago. [facility] and working to find permanent living. 2 days ago he started having some central chest discomfort after eating "certain" cholesterol containing foods. Pain only present after eating, lingered for a couple of hours and resolved on its own. States his food choices are limited at the shelter, but he has been trying to eat right. Denies  any anginal symptoms, actually states he walked about 1 1/1 miles this morning without any symptoms. Trop neg x1. EKG with no acute changes noted. Currently with no complaints. States he really just feels that he needs his statin refilled.   Inpatient Medications      Family History    Family History  Problem Relation Age of Onset  . Cancer Father   . Hypertension    . Heart attack Neg Hx   . Stroke Neg Hx     Social History    Social History   Social History  . Marital status: Single    Spouse name: N/A  . Number of children: 0  . Years of education: N/A   Occupational History  . disabled     former Corporate investment bankerconstruction worker   Social History Main Topics  . Smoking status: Current Every Day Smoker    Packs/day: 0.00    Years: 50.00    Types: Cigarettes  . Smokeless tobacco: Current User  . Alcohol use No  . Drug use: No  . Sexual activity: Not on file   Other Topics Concern  . Not on file   Social History Narrative  . No narrative on file     Review of Systems    General:  No chills, fever, night sweats or weight changes.  Cardiovascular: See HPI Dermatological: No rash, lesions/masses Respiratory: No cough, dyspnea Urologic: No hematuria, dysuria Abdominal:   No nausea, vomiting, diarrhea, bright red blood per rectum, melena, or hematemesis Neurologic:  No visual changes, wkns, changes in mental status. All other systems reviewed and are otherwise negative except as noted above.  Physical Exam    Blood pressure 125/75, pulse 67, temperature 98 F (36.7 C), temperature source Oral, resp. rate 23, weight 185 lb (83.9 kg), SpO2 99 %.  General: Pleasant, NAD Psych: Normal affect. Neuro: Alert and oriented X 3. Moves all extremities spontaneously. HEENT: Normal  Neck: Supple without bruits or JVD. Lungs:  Resp regular and unlabored, CTA. Heart: RRR no s3, s4, or murmurs. Abdomen: Soft, non-tender, non-distended, BS + x 4.  Extremities: No clubbing, cyanosis  or edema. DP/PT/Radials 2+ and equal bilaterally.  Labs    Troponin (Point of Care Test)  Recent Labs  09/27/16 1042  TROPIPOC 0.01   No results for input(s): CKTOTAL, CKMB, TROPONINI in the last 72 hours. Lab Results  Component Value Date   WBC 8.6 09/27/2016   HGB 15.9 09/27/2016   HCT 46.0 09/27/2016   MCV 97.5 09/27/2016   PLT 214 09/27/2016    Recent Labs Lab 09/27/16 1026  NA 140  K 4.4  CL 106  CO2 27  BUN 8  CREATININE 0.88  CALCIUM 9.3  GLUCOSE 77   Lab Results  Component Value Date   CHOL 122 01/26/2015   HDL 31.50 (L) 01/26/2015   LDLCALC 64 01/26/2015   TRIG 132.0 01/26/2015   Lab Results  Component Value Date   DDIMER 0.33 12/22/2014     Radiology Studies  Dg Chest 2 View  Result Date: 09/27/2016 CLINICAL DATA:  Chest pain for 2 days EXAM: CHEST  2 VIEW COMPARISON:  07/11/2016 FINDINGS: Cardiomediastinal silhouette is stable. Mild hyperinflation again noted. Stable 8 mm nodule left lower lobe. No infiltrate or pulmonary edema. Mild degenerative change thoracic spine. IMPRESSION: No active cardiopulmonary disease. Electronically Signed   By: Natasha Mead M.D.   On: 09/27/2016 10:50    ECG & Cardiac Imaging    EKG: SR, diffuse ST depression ( noted on previous EKGs)  Echo: 05/12/15  Study Conclusions  - Left ventricle: The cavity size was normal. Systolic function was   severely reduced. The estimated ejection fraction was in the   range of 20% to 25%. Diffuse hypokinesis. There is akinesis of   the apicalanterior myocardium. There is akinesis of the   basal-midanterolateral myocardium. There is severe hypokinesis of   the entireinferior myocardium. There is akinesis of the   entireinferolateral myocardium. Features are consistent with a   pseudonormal left ventricular filling pattern, with concomitant   abnormal relaxation and increased filling pressure (grade 2   diastolic dysfunction). - Aortic valve: Trileaflet; mildly thickened,  mildly calcified   leaflets. - Mitral valve: There was trivial regurgitation. - Tricuspid valve: There was trivial regurgitation.  Assessment & Plan    65 yo male with PMH of 3v CAD, HTN, HLD, tobacco use who presented to the [facility] ED with reports of intermittent dull chest discomfort for the past 2 days.   1. Chest discomfort: Very atypical symptoms on presentation. States this is nothing like his heart attack last year. EKG non acute, trop neg x1. He is currently without complaint. Appears he has been very conscious of his health, reporting he lost about 70lbs over the past year. Discussed the option of overnight admission to rule out, but he expressed wanting to leave. He is very adamant about getting his statin refilled. Does have significant cardiac hx, but states again that he is not interested in CABG.  -- Given presentation this seems reasonable. Will send to have a follow up appt made in the office.   2. HTN: Well controlled.  3. Tobacco Use: States he is smoking about 4 cig a day. Working to quit.   Janice Coffin, NP-C Pager [phone] 09/27/2016, 1:54 PM As above, patient seen and examined. Briefly he is a 65 year old male with past medical history of severe coronary disease, ischemic cardiomyopathy for evaluation of chest pain. Patient had a non-ST elevation myocardial infarction in January 2016. He had severe three-vessel coronary disease at that time and coronary artery bypass and graft recommended but he declined. He had PCI of his right coronary artery. He has since declined bypass surgery and ICD and understands the risk of myocardial infarction and death. He does not have dyspnea on exertion, orthopnea, PND, pedal edema, exertional chest pain or syncope. He presented today with complaints of 48 hours of intermittent chest pain. It is substernal and described as a dull sensation. It does not radiate. No associated symptoms. Occurs only after eating certain foods  and last 3-4 hours. Electrocardiogram shows sinus rhythm, cannot rule out prior septal infarct, nonspecific lateral T-wave changes. It is unchanged compared to previous. Troponin is normal. Chest pain is atypical and likely GI in etiology. He states it is not like the pain with his infarct. Given his cardiac history I recommended admission and rule out myocardial infarction but he declined. He understands the risk of myocardial infarction and death. He  again declines coronary artery bypass and graft. Continue aspirin and statin. He has not tolerated beta blockade in the past as outlined in Dr. Elvis CoilJordan's note. He will follow-up as scheduled with Dr. SwazilandJordan. Please call with questions.  Olga MillersBrian Crenshaw, MD

## 2016-09-27 NOTE — ED Provider Notes (Signed)
[facility]-EMERGENCY DEPT Provider Note   CSN: [id number] Arrival date & time: 09/27/16  1016     History   Chief Complaint Chief Complaint  Patient presents with  . Chest Pain    HPI Casey Jackson is a 65 y.o. male.  HPI Casey Jackson is a 65 y.o. male with history of coronary disease, MI, ischemic cardiomyopathy, presents to emergency department complaining of chest pain. Patient states that his pain started 2 days ago. He denies exertional symptoms. States "just pressure on the center of my chest." He denies symptoms similar to prior MI. He states that he is not having any shortness of breath, diaphoresis, dizziness, lightheadedness. Patient was stented in January 2016, refused CABG at that time. States he has been doing well since then. States he ran out of his cholesterol medication 2 weeks ago and believes that that is what's causing his chest pressure. He is currently just taking 81 mg of aspirin daily. No other medications. Denies any pain radiating down her extremities or face. Denies any swelling in extremities. No other complaints.  Past Medical History:  Diagnosis Date  . Coronary artery disease    a. Big NSTEMI 11/2014: cath with surgical disease but patient initially refused CABG. Compromise decision was made to do intervention on the RCA with a bare metal stent with medical therapy for other disease and follow-up as an outpatient.  [name]. HOH (hard of hearing)   . Hyperlipidemia   . Ischemic cardiomyopathy    a. a. Cath 12/22/14: EF 25%. b. 2D Echo 12/23/14: EF 25-30%, diffuse hypokinesis, akinesis of the entireinferolateral and inferior myocardium, mild MR.  . Myocardial infarction    NSTEMI  . Osteoarthritis   . Tobacco abuse   . Transaminitis     Patient Active Problem List   Diagnosis Date Noted  . Pain in the chest   . Palpitations   . ACS (acute coronary syndrome) (HCC) 05/01/2015  . Chest pain 05/01/2015  . Chronic systolic CHF (congestive heart failure) (HCC)  04/27/2015  . Coronary artery disease   . Transaminitis   . Tobacco abuse   . HOH (hard of hearing)   . Osteoarthritis   . Hyperlipidemia LDL goal <70 12/24/2014  . Cardiomyopathy, ischemic 12/24/2014  . NSTEMI (non-ST elevated myocardial infarction) (HCC) 12/22/2014    Past Surgical History:  Procedure Laterality Date  . LEFT HEART CATHETERIZATION WITH CORONARY ANGIOGRAM N/A 12/22/2014   Procedure: LEFT HEART CATHETERIZATION WITH CORONARY ANGIOGRAM;  Surgeon: Corky CraftsJayadeep S Varanasi, MD; LAD 100%, CFX 90%, RI mild dz, RCA 95%/70%, EF 25%, CABG recommended  . PERCUTANEOUS CORONARY STENT INTERVENTION (PCI-S) N/A 12/23/2014   Procedure: PERCUTANEOUS CORONARY STENT INTERVENTION (PCI-S);  Surgeon: Corky CraftsJayadeep S Varanasi, MD; 3.0 x 24 rebel BMS to the RCA        Home Medications    Prior to Admission medications   Medication Sig Start Date End Date Taking? Authorizing Provider  aspirin 81 MG tablet Take 81 mg by mouth daily.   Yes Historical Provider, MD  atorvastatin (LIPITOR) 20 MG tablet Take 20 mg by mouth every morning.   Yes Historical Provider, MD  acetaminophen (TYLENOL) 500 MG tablet Take 1 tablet (500 mg total) by mouth every 6 (six) hours as needed. Patient not taking: Reported on 09/27/2016 07/13/16   Cheri FowlerKayla Rose, PA-C  HYDROcodone-acetaminophen (NORCO) 5-325 MG tablet Take 1-2 tablets by mouth every 4 (four) hours as needed. Patient not taking: Reported on 09/27/2016 05/03/16   Geoffery Lyonsouglas Delo, MD  ibuprofen (ADVIL,MOTRIN) 400  MG tablet Take 1 tablet (400 mg total) by mouth every 6 (six) hours as needed. Patient not taking: Reported on 09/27/2016 07/13/16   Cheri Fowler, PA-C  nitroGLYCERIN (NITROSTAT) 0.4 MG SL tablet Place 1 tablet (0.4 mg total) under the tongue every 5 (five) minutes as needed for chest pain. 12/24/14   Rhonda G Barrett, PA-C  prasugrel (EFFIENT) 10 MG TABS tablet Take 1 tablet (10 mg total) by mouth daily. Patient not taking: Reported on 09/27/2016 11/08/15   Azalee Course, PA     Family History Family History  Problem Relation Age of Onset  . Cancer Father   . Hypertension    . Heart attack Neg Hx   . Stroke Neg Hx     Social History Social History  Substance Use Topics  . Smoking status: Current Every Day Smoker    Packs/day: 0.00    Years: 50.00    Types: Cigarettes  . Smokeless tobacco: Current User  . Alcohol use No     Allergies   Penicillins and Ergocalciferol   Review of Systems Review of Systems  Constitutional: Negative for chills and fever.  Respiratory: Positive for chest tightness. Negative for cough and shortness of breath.   Cardiovascular: Positive for chest pain. Negative for palpitations and leg swelling.  Gastrointestinal: Negative for abdominal distention, abdominal pain, diarrhea, nausea and vomiting.  Musculoskeletal: Negative for arthralgias, myalgias, neck pain and neck stiffness.  Skin: Negative for rash.  Allergic/Immunologic: Negative for immunocompromised state.  Neurological: Negative for dizziness, weakness, light-headedness, numbness and headaches.  All other systems reviewed and are negative.    Physical Exam Updated Vital Signs BP 144/75   Pulse 71   Temp 98 F (36.7 C) (Oral)   Resp 17   Wt 83.9 kg   SpO2 100%   BMI 25.80 kg/m   Physical Exam  Constitutional: He appears well-developed and well-nourished. No distress.  HENT:  Head: Normocephalic and atraumatic.  Eyes: Conjunctivae are normal.  Neck: Neck supple.  Cardiovascular: Normal rate, regular rhythm and normal heart sounds.   Pulmonary/Chest: Effort normal. No respiratory distress. He has no wheezes. He has no rales.  Abdominal: Soft. Bowel sounds are normal. He exhibits no distension. There is no tenderness. There is no rebound.  Musculoskeletal: He exhibits no edema.  Neurological: He is alert.  Skin: Skin is warm and dry.  Nursing note and vitals reviewed.    ED Treatments / Results  Labs (all labs ordered are listed, but only  abnormal results are displayed) Labs Reviewed  CBC  BASIC METABOLIC PANEL  I-STAT TROPOININ, ED    EKG  EKG Interpretation  Date/Time:  Wednesday September 27 2016 10:24:20 EDT Ventricular Rate:  76 PR Interval:  126 QRS Duration: 118 QT Interval:  424 QTC Calculation: 477 R Axis:   -45 Text Interpretation:  Normal sinus rhythm Left axis deviation Septal infarct , age undetermined ST & T wave abnormality, consider lateral ischemia Abnormal ECG agree. no sig change c/w old Confirmed by Donnald Garre, MD, Lebron Conners [phone]) on 09/27/2016 10:23:59 AM Also confirmed by Donnald Garre, MD, Lebron Conners [phone]), editor [place], [place] [phone])  on 09/27/2016 10:42:32 AM       Radiology Dg Chest 2 View  Result Date: 09/27/2016 CLINICAL DATA:  Chest pain for 2 days EXAM: CHEST  2 VIEW COMPARISON:  07/11/2016 FINDINGS: Cardiomediastinal silhouette is stable. Mild hyperinflation again noted. Stable 8 mm nodule left lower lobe. No infiltrate or pulmonary edema. Mild degenerative change thoracic spine. IMPRESSION: No active cardiopulmonary  disease. Electronically Signed   By: Natasha MeadLiviu  Pop M.D.   On: 09/27/2016 10:50    Procedures Procedures (including critical care time)  Medications Ordered in ED Medications  aspirin chewable tablet 324 mg (not administered)     Initial Impression / Assessment and Plan / ED Course  I have reviewed the triage vital signs and the nursing notes.  Pertinent labs & imaging results that were available during my care of the patient were reviewed by me and considered in my medical decision making (see chart for details).  Clinical Course    11:07 AM Seen and examined. Patient with prior history of MI with stenting in January 2016. Was recommended to have a CABG, however he refused. Still known coronary disease. Here with chest pressure, does not feel like prior MI. Will get labs including troponin, chest x-ray. Aspirin 324 mg ordered.  1:00 PM Patient is initial troponin and blood  work unremarkable. Chest x-ray negative. Discussed with cardiology will come by and see  3:33 PM Patient seen by cardiology. Offered observation admission, however patient declined. Second troponin is negative. Patient will be given a close follow-up appointment with cardiology for recheck. I will refill patient's Lipitor by his request. We'll have him follow-up closely. Return precautions discussed. No chest pain at this time.   Vitals:   09/27/16 1330 09/27/16 1400 09/27/16 1430 09/27/16 1500  BP: 125/75 134/67 123/64 128/65  Pulse: 67 67  (!) 57  Resp: 23 23 26 21   Temp:      TempSrc:      SpO2: 99% 100%  100%  Weight:         Final Clinical Impressions(s) / ED Diagnoses   Final diagnoses:  Chest pain, unspecified type    New Prescriptions New Prescriptions   ATORVASTATIN (LIPITOR) 20 MG TABLET    Take 1 tablet (20 mg total) by mouth daily.     Jaynie Crumbleatyana Ulis Kaps, PA-C 09/27/16 1537    Loren Raceravid Yelverton, MD 09/29/16 1016

## 2016-09-27 NOTE — Discharge Instructions (Signed)
Continue to take your Lipitor. Please follow-up with cardiology closely for recheck. Return if worsening symptoms.

## 2016-09-27 NOTE — ED Notes (Signed)
Pt to come from radiology to patient room.

## 2016-09-29 ENCOUNTER — Telehealth: Payer: Self-pay | Admitting: Cardiology

## 2016-09-29 NOTE — Telephone Encounter (Signed)
Closed encounter °

## 2016-11-01 ENCOUNTER — Ambulatory Visit: Payer: Medicare (Managed Care) | Admitting: Cardiology

## 2016-12-03 ENCOUNTER — Emergency Department (HOSPITAL_COMMUNITY): Payer: Medicare Other

## 2016-12-03 ENCOUNTER — Emergency Department (HOSPITAL_COMMUNITY)
Admission: EM | Admit: 2016-12-03 | Discharge: 2016-12-03 | Disposition: A | Discharge status: Home / Self Care | Payer: Medicare Other | Attending: Emergency Medicine | Admitting: Emergency Medicine

## 2016-12-03 ENCOUNTER — Encounter (HOSPITAL_COMMUNITY): Payer: Self-pay | Admitting: Emergency Medicine

## 2016-12-03 DIAGNOSIS — I2510 Atherosclerotic heart disease of native coronary artery without angina pectoris: Secondary | ICD-10-CM | POA: Diagnosis not present

## 2016-12-03 DIAGNOSIS — I252 Old myocardial infarction: Secondary | ICD-10-CM | POA: Diagnosis not present

## 2016-12-03 DIAGNOSIS — Z7982 Long term (current) use of aspirin: Secondary | ICD-10-CM | POA: Insufficient documentation

## 2016-12-03 DIAGNOSIS — Z79899 Other long term (current) drug therapy: Secondary | ICD-10-CM | POA: Diagnosis not present

## 2016-12-03 DIAGNOSIS — R0981 Nasal congestion: Secondary | ICD-10-CM | POA: Diagnosis present

## 2016-12-03 DIAGNOSIS — I110 Hypertensive heart disease with heart failure: Secondary | ICD-10-CM | POA: Insufficient documentation

## 2016-12-03 DIAGNOSIS — J069 Acute upper respiratory infection, unspecified: Secondary | ICD-10-CM

## 2016-12-03 DIAGNOSIS — F17210 Nicotine dependence, cigarettes, uncomplicated: Secondary | ICD-10-CM | POA: Insufficient documentation

## 2016-12-03 DIAGNOSIS — I5022 Chronic systolic (congestive) heart failure: Secondary | ICD-10-CM | POA: Insufficient documentation

## 2016-12-03 DIAGNOSIS — B9789 Other viral agents as the cause of diseases classified elsewhere: Secondary | ICD-10-CM

## 2016-12-03 MED ORDER — IPRATROPIUM-ALBUTEROL 0.5-2.5 (3) MG/3ML IN SOLN
3.0000 mL | Freq: Once | RESPIRATORY_TRACT | Status: AC
  Administered 2016-12-03: 3 mL via RESPIRATORY_TRACT
  Filled 2016-12-03: qty 3

## 2016-12-03 MED ORDER — BENZONATATE 100 MG PO CAPS: 100.0000 mg | ORAL_CAPSULE | Freq: Three times a day (TID) | ORAL | 0 refills | Status: DC

## 2016-12-03 MED ORDER — ATORVASTATIN CALCIUM 20 MG PO TABS: 20.0000 mg | ORAL_TABLET | Freq: Every day | ORAL | 1 refills | Status: DC

## 2016-12-03 MED ORDER — ALBUTEROL SULFATE HFA 108 (90 BASE) MCG/ACT IN AERS: 1.0000 | INHALATION_SPRAY | Freq: Four times a day (QID) | RESPIRATORY_TRACT | 0 refills | Status: DC | PRN

## 2016-12-03 MED ORDER — FLUTICASONE PROPIONATE 50 MCG/ACT NA SUSP: 2.0000 | Freq: Every day | NASAL | 2 refills | Status: DC

## 2016-12-03 NOTE — ED Triage Notes (Signed)
Pt. Stated, I tried some alka Seltzer Plus and it hasn't did anything for it.

## 2016-12-03 NOTE — Discharge Instructions (Signed)
Your chest x-ray shows no signs of pneumonia. This is likely a viral upper respiratory tract infection. I have given you a prescription for Flonase for your nasal congestion. I have also given albuterol inhaler to help with your cough. He may take Tessalon as well for cough. Please drink plenty of water to stay hydrated. He may use Tylenol and ibuprofen for fevers and pain. I would recommend getting over-the-counter Mucinex to help with chest congestion. Follow-up with your primary care doctor in regards to today's visit. Return to the ED if your symptoms worsen. Please follow up with your primary card doctor to help you stop smoking.

## 2016-12-03 NOTE — ED Provider Notes (Signed)
[facility] DEPT Provider Note   CSN: [id number] Arrival date & time: 12/03/16  [id number]     History   Chief Complaint Chief Complaint  Patient presents with  . URI  . Nasal Congestion    HPI Casey Jackson is a 66 y.o. male.  66 year old Caucasian male with a past medical history significant for CAD status post PCI, hypertension, hyperlipidemia that presents to the ED today with URI symptoms. Patient states that he developed a "head cold" approximately 3 days ago. He endorses green to yellow nasal discharge, sinus congestion. This then progressed to his chest. States that he's been coughing up green mucus. He also reports subjective fevers at home yesterday. Patient took Alka-Seltzer cold and flu that has not done anything for his symptoms. He states that it did improve his fever. Patient did not take his temperature at home. Patient endorses mild sore throat. Denies any body aches. Patient endorses sick contacts. Patient states he is 3 cigarette smoker a day. Denies any history of underlying lung disease. Patient states that when he coughs he has a rattling in his chest. He denies any headache, vision changes, lightheadedness, dizziness, chest pain, shortness of breath, abdominal pain, nausea, emesis, urinary symptoms, change in bowel habits.      Past Medical History:  Diagnosis Date  . Coronary artery disease    a. Big NSTEMI 11/2014: cath with surgical disease but patient initially refused CABG. Compromise decision was made to do intervention on the RCA with a bare metal stent with medical therapy for other disease and follow-up as an outpatient.  [name]. HOH (hard of hearing)   . Hyperlipidemia   . Ischemic cardiomyopathy    a. a. Cath 12/22/14: EF 25%. b. 2D Echo 12/23/14: EF 25-30%, diffuse hypokinesis, akinesis of the entireinferolateral and inferior myocardium, mild MR.  . Myocardial infarction    NSTEMI  . Osteoarthritis   . Tobacco abuse   . Transaminitis     Patient Active  Problem List   Diagnosis Date Noted  . Pain in the chest   . Palpitations   . ACS (acute coronary syndrome) (HCC) 05/01/2015  . Chest pain 05/01/2015  . Chronic systolic CHF (congestive heart failure) (HCC) 04/27/2015  . Coronary artery disease   . Transaminitis   . Tobacco abuse   . HOH (hard of hearing)   . Osteoarthritis   . Hyperlipidemia LDL goal <70 12/24/2014  . Cardiomyopathy, ischemic 12/24/2014  . NSTEMI (non-ST elevated myocardial infarction) (HCC) 12/22/2014    Past Surgical History:  Procedure Laterality Date  . LEFT HEART CATHETERIZATION WITH CORONARY ANGIOGRAM N/A 12/22/2014   Procedure: LEFT HEART CATHETERIZATION WITH CORONARY ANGIOGRAM;  Surgeon: Corky CraftsJayadeep S Varanasi, MD; LAD 100%, CFX 90%, RI mild dz, RCA 95%/70%, EF 25%, CABG recommended  . PERCUTANEOUS CORONARY STENT INTERVENTION (PCI-S) N/A 12/23/2014   Procedure: PERCUTANEOUS CORONARY STENT INTERVENTION (PCI-S);  Surgeon: Corky CraftsJayadeep S Varanasi, MD; 3.0 x 24 rebel BMS to the RCA        Home Medications    Prior to Admission medications   Medication Sig Start Date End Date Taking? Authorizing Provider  acetaminophen (TYLENOL) 500 MG tablet Take 1 tablet (500 mg total) by mouth every 6 (six) hours as needed. Patient not taking: Reported on 09/27/2016 07/13/16   Cheri FowlerKayla Rose, PA-C  aspirin 81 MG tablet Take 81 mg by mouth daily.    Historical Provider, MD  atorvastatin (LIPITOR) 20 MG tablet Take 20 mg by mouth every morning.    Historical Provider, MD  atorvastatin (LIPITOR) 20 MG tablet Take 1 tablet (20 mg total) by mouth daily. 09/27/16   Tatyana Kirichenko, PA-C  HYDROcodone-acetaminophen (NORCO) 5-325 MG tablet Take 1-2 tablets by mouth every 4 (four) hours as needed. Patient not taking: Reported on 09/27/2016 05/03/16   Geoffery Lyons, MD  ibuprofen (ADVIL,MOTRIN) 400 MG tablet Take 1 tablet (400 mg total) by mouth every 6 (six) hours as needed. Patient not taking: Reported on 09/27/2016 07/13/16   Cheri Fowler, PA-C    nitroGLYCERIN (NITROSTAT) 0.4 MG SL tablet Place 1 tablet (0.4 mg total) under the tongue every 5 (five) minutes as needed for chest pain. 12/24/14   Rhonda G Barrett, PA-C  prasugrel (EFFIENT) 10 MG TABS tablet Take 1 tablet (10 mg total) by mouth daily. Patient not taking: Reported on 09/27/2016 11/08/15   Azalee Course, PA    Family History Family History  Problem Relation Age of Onset  . Cancer Father   . Hypertension    . Heart attack Neg Hx   . Stroke Neg Hx     Social History Social History  Substance Use Topics  . Smoking status: Current Every Day Smoker    Packs/day: 0.00    Years: 50.00    Types: Cigarettes  . Smokeless tobacco: Current User  . Alcohol use No     Allergies   Penicillins and Ergocalciferol   Review of Systems Review of Systems  Constitutional: Negative for chills and fever.  HENT: Positive for congestion, postnasal drip, rhinorrhea, sinus pain, sinus pressure and sore throat. Negative for ear pain.   Eyes: Negative for visual disturbance.  Respiratory: Positive for cough. Negative for shortness of breath and wheezing.   Cardiovascular: Negative for palpitations.  Gastrointestinal: Negative for abdominal pain, diarrhea, nausea and vomiting.  Skin: Negative.   Neurological: Negative for dizziness, syncope, weakness, light-headedness and headaches.  All other systems reviewed and are negative.    Physical Exam Updated Vital Signs BP 144/70 (BP Location: Left Arm)   Pulse 97   Temp 97.6 F (36.4 C) (Oral)   Resp 20   Ht 5\' 11"  (1.803 m)   Wt 81.6 kg   SpO2 97%   BMI 25.10 kg/m   Physical Exam  Constitutional: He appears well-developed and well-nourished. No distress.  HENT:  Head: Normocephalic and atraumatic.  Right Ear: Tympanic membrane, external ear and ear canal normal.  Left Ear: Tympanic membrane, external ear and ear canal normal.  Nose: Nose normal.  Mouth/Throat: Uvula is midline and mucous membranes are normal. Posterior  oropharyngeal erythema present. No oropharyngeal exudate, posterior oropharyngeal edema or tonsillar abscesses. Tonsils are 0 on the right. Tonsils are 0 on the left. No tonsillar exudate.  Eyes: Conjunctivae are normal. Right eye exhibits no discharge. Left eye exhibits no discharge. No scleral icterus.  Neck: Normal range of motion. Neck supple. No thyromegaly present.  Cardiovascular: Normal rate, regular rhythm, normal heart sounds and intact distal pulses.  Exam reveals no gallop and no friction rub.   No murmur heard. Pulmonary/Chest: Effort normal. No respiratory distress. He has wheezes. He exhibits no tenderness.  Course sounds noted throughout all lung fields. Clears with cough. Mild scattered wheezes heard. Patient is not hypoxic or tachypnea.  Abdominal: Soft. Bowel sounds are normal. He exhibits no distension. There is no tenderness. There is no rebound and no guarding.  Musculoskeletal: Normal range of motion.  Lymphadenopathy:    He has no cervical adenopathy.  Neurological: He is alert.  Skin: Skin is warm and  dry. Capillary refill takes less than 2 seconds.  Nursing note and vitals reviewed.    ED Treatments / Results  Labs (all labs ordered are listed, but only abnormal results are displayed) Labs Reviewed - No data to display  EKG  EKG Interpretation None       Radiology Dg Chest 2 View  Result Date: 12/03/2016 CLINICAL DATA:  Cold like symptoms for several days, initial encounter EXAM: CHEST  2 VIEW COMPARISON:  09/27/2016 FINDINGS: Cardiac shadow is within normal limits. The lungs are hyperinflated consistent with COPD. No focal infiltrate or sizable effusion is seen. Previously seen nodular density on the left is not well appreciated on the current exam degenerative changes of the thoracic spine are noted. IMPRESSION: No acute abnormality noted. Electronically Signed   By: Alcide Clever M.D.   On: 12/03/2016 10:55    Procedures Procedures (including critical  care time)  Medications Ordered in ED Medications  ipratropium-albuterol (DUONEB) 0.5-2.5 (3) MG/3ML nebulizer solution 3 mL (3 mLs Nebulization Given 12/03/16 1226)     Initial Impression / Assessment and Plan / ED Course  I have reviewed the triage vital signs and the nursing notes.  Pertinent labs & imaging results that were available during my care of the patient were reviewed by me and considered in my medical decision making (see chart for details).  Clinical Course   Patient presents to the ED with URI symptoms. Patient is afebrile, not tachycardic, not hypoxic, not tachypnea can the ED. He appears nontoxic and is in no acute distress. Chest x-ray shows no focal infiltrate. Lung exam with coarse sounds and scattered wheezes that clear with cough. DuoNeb was given. Improvement of patient's lung sounds. We'll encourage symptomatic treatment at home. Patient is asking for refill of his lipid medication. We will prescribe a 30 day supply. The patient is a chronic smoker. History of COPD. Does not have any inhalers at home. Will discharge patient with Flonase, albuterol inhaler, cough medication. Encourage symptomatic treatment. Encouraged follow-up with his primary care doctor. Counseled patient on importance of stop smoking. Patient was seen and examined by Dr. Rennis Chris who agrees with the above plan. Pt is hemodynamically stable, in NAD, & able to ambulate in the ED. Pain has been managed & has no complaints prior to dc. Pt is comfortable with above plan and is stable for discharge at this time. All questions were answered prior to disposition. Strict return precautions for f/u to the ED were discussed.   Final Clinical Impressions(s) / ED Diagnoses   Final diagnoses:  Viral URI with cough    New Prescriptions New Prescriptions   ALBUTEROL (PROVENTIL HFA;VENTOLIN HFA) 108 (90 BASE) MCG/ACT INHALER    Inhale 1-2 puffs into the lungs every 6 (six) hours as needed for wheezing or shortness  of breath.   BENZONATATE (TESSALON) 100 MG CAPSULE    Take 1 capsule (100 mg total) by mouth every 8 (eight) hours.   FLUTICASONE (FLONASE) 50 MCG/ACT NASAL SPRAY    Place 2 sprays into both nostrils daily.     Rise Mu, PA-C 12/03/16 1404    Doug Sou, MD 12/03/16 1610

## 2016-12-03 NOTE — ED Provider Notes (Signed)
Complains of nasal congestion and congestion in his chest for 3 days. Had temperature 101 yesterday. He feels improved today over yesterday. On exam alert and in no distress HEENT exam no facial asymmetry nose normal oropharynx normal lungs diffuse scant rhonchi. No respiratory distress. Speaks in paragraphs and I counseled patient for  minutes on smoking cessation. Chest x-ray viewed by me   Doug SouSam Elwood Bazinet, MD 12/03/16 1735

## 2016-12-03 NOTE — ED Triage Notes (Signed)
Pt. Stated, I've had a cold and congestion for the last 3 days its gone to my chest too.

## 2017-01-03 ENCOUNTER — Encounter (HOSPITAL_COMMUNITY): Payer: Self-pay | Admitting: Emergency Medicine

## 2017-01-03 ENCOUNTER — Emergency Department (HOSPITAL_COMMUNITY)
Admission: EM | Admit: 2017-01-03 | Discharge: 2017-01-03 | Disposition: A | Discharge status: Home / Self Care | Payer: Medicare Other | Attending: Physician Assistant | Admitting: Physician Assistant

## 2017-01-03 ENCOUNTER — Emergency Department (HOSPITAL_COMMUNITY): Payer: Medicare Other

## 2017-01-03 DIAGNOSIS — Z7982 Long term (current) use of aspirin: Secondary | ICD-10-CM | POA: Diagnosis not present

## 2017-01-03 DIAGNOSIS — F17210 Nicotine dependence, cigarettes, uncomplicated: Secondary | ICD-10-CM | POA: Insufficient documentation

## 2017-01-03 DIAGNOSIS — I5022 Chronic systolic (congestive) heart failure: Secondary | ICD-10-CM | POA: Diagnosis not present

## 2017-01-03 DIAGNOSIS — R05 Cough: Secondary | ICD-10-CM | POA: Diagnosis present

## 2017-01-03 DIAGNOSIS — I2510 Atherosclerotic heart disease of native coronary artery without angina pectoris: Secondary | ICD-10-CM | POA: Diagnosis not present

## 2017-01-03 DIAGNOSIS — J40 Bronchitis, not specified as acute or chronic: Secondary | ICD-10-CM | POA: Diagnosis not present

## 2017-01-03 DIAGNOSIS — I252 Old myocardial infarction: Secondary | ICD-10-CM | POA: Insufficient documentation

## 2017-01-03 DIAGNOSIS — Z79899 Other long term (current) drug therapy: Secondary | ICD-10-CM | POA: Diagnosis not present

## 2017-01-03 DIAGNOSIS — R059 Cough, unspecified: Secondary | ICD-10-CM

## 2017-01-03 HISTORY — DX: Pneumonia, unspecified organism: J18.9

## 2017-01-03 LAB — COMPREHENSIVE METABOLIC PANEL
ALBUMIN: 3.5 g/dL (ref 3.5–5.0)
ALK PHOS: 67 U/L (ref 38–126)
ALT: 12 U/L — ABNORMAL LOW (ref 17–63)
AST: 15 U/L (ref 15–41)
Anion gap: 9 (ref 5–15)
BUN: 10 mg/dL (ref 6–20)
CALCIUM: 9 mg/dL (ref 8.9–10.3)
CO2: 25 mmol/L (ref 22–32)
CREATININE: 0.87 mg/dL (ref 0.61–1.24)
Chloride: 103 mmol/L (ref 101–111)
GFR calc Af Amer: >60 mL/min (ref >60)
GFR calc non Af Amer: >60 mL/min (ref >60)
Glucose, Bld: 119 mg/dL — ABNORMAL HIGH (ref 65–99)
Potassium: 3.8 mmol/L (ref 3.5–5.1)
SODIUM: 137 mmol/L (ref 135–145)
Total Bilirubin: 0.8 mg/dL (ref 0.3–1.2)
Total Protein: 6.9 g/dL (ref 6.5–8.1)

## 2017-01-03 LAB — URINALYSIS, ROUTINE W REFLEX MICROSCOPIC
BACTERIA UA: NONE SEEN
BILIRUBIN URINE: NEGATIVE
Glucose, UA: NEGATIVE mg/dL
Ketones, ur: NEGATIVE mg/dL
Leukocytes, UA: NEGATIVE
NITRITE: NEGATIVE
PH: 6 (ref 5.0–8.0)
Protein, ur: NEGATIVE mg/dL
SPECIFIC GRAVITY, URINE: 1.023 (ref 1.005–1.030)
Squamous Epithelial / LPF: NONE SEEN
WBC, UA: NONE SEEN WBC/hpf (ref 0–5)

## 2017-01-03 LAB — CBC WITH DIFFERENTIAL/PLATELET
BASOS PCT: 1 %
Basophils Absolute: 0.1 10*3/uL (ref 0.0–0.1)
EOS ABS: 0.1 10*3/uL (ref 0.0–0.7)
EOS PCT: 1 %
HEMATOCRIT: 39.8 % (ref 39.0–52.0)
Hemoglobin: 13.7 g/dL (ref 13.0–17.0)
Lymphocytes Relative: 12 %
Lymphs Abs: 1.5 10*3/uL (ref 0.7–4.0)
MCH: 33.7 pg (ref 26.0–34.0)
MCHC: 34.4 g/dL (ref 30.0–36.0)
MCV: 98 fL (ref 78.0–100.0)
MONO ABS: 1.1 10*3/uL — AB (ref 0.1–1.0)
MONOS PCT: 9 %
NEUTROS ABS: 9.8 10*3/uL — AB (ref 1.7–7.7)
Neutrophils Relative %: 77 %
Platelets: 203 10*3/uL (ref 150–400)
RBC: 4.06 MIL/uL — ABNORMAL LOW (ref 4.22–5.81)
RDW: 13 % (ref 11.5–15.5)
WBC: 12.6 10*3/uL — ABNORMAL HIGH (ref 4.0–10.5)

## 2017-01-03 LAB — I-STAT CG4 LACTIC ACID, ED: Lactic Acid, Venous: 1.01 mmol/L (ref 0.5–1.9)

## 2017-01-03 LAB — I-STAT TROPONIN, ED: Troponin i, poc: 0.02 ng/mL (ref 0.00–0.08)

## 2017-01-03 MED ORDER — PREDNISONE 20 MG PO TABS: 40.0000 mg | ORAL_TABLET | Freq: Every day | ORAL | 0 refills | Status: DC

## 2017-01-03 MED ORDER — BENZONATATE 100 MG PO CAPS
100.0000 mg | ORAL_CAPSULE | Freq: Once | ORAL | Status: AC
  Administered 2017-01-03: 100 mg via ORAL
  Filled 2017-01-03: qty 1

## 2017-01-03 MED ORDER — PREDNISONE 20 MG PO TABS
60.0000 mg | ORAL_TABLET | Freq: Once | ORAL | Status: AC
  Administered 2017-01-03: 60 mg via ORAL
  Filled 2017-01-03: qty 3

## 2017-01-03 MED ORDER — ALBUTEROL SULFATE HFA 108 (90 BASE) MCG/ACT IN AERS: 1.0000 | INHALATION_SPRAY | Freq: Four times a day (QID) | RESPIRATORY_TRACT | 0 refills | Status: DC | PRN

## 2017-01-03 MED ORDER — AZITHROMYCIN 500 MG PO TABS: 500.0000 mg | ORAL_TABLET | Freq: Every day | ORAL | 0 refills | Status: DC

## 2017-01-03 MED ORDER — IPRATROPIUM-ALBUTEROL 0.5-2.5 (3) MG/3ML IN SOLN
3.0000 mL | Freq: Once | RESPIRATORY_TRACT | Status: AC
  Administered 2017-01-03: 3 mL via RESPIRATORY_TRACT
  Filled 2017-01-03: qty 3

## 2017-01-03 MED ORDER — BENZONATATE 100 MG PO CAPS: 100.0000 mg | ORAL_CAPSULE | Freq: Three times a day (TID) | ORAL | 0 refills | Status: DC

## 2017-01-03 NOTE — ED Notes (Signed)
Sig pad not available.

## 2017-01-03 NOTE — ED Provider Notes (Signed)
[facility] Provider Note   CSN: [id number] Arrival date & time: 01/03/17  1447     History   Chief Complaint Chief Complaint  Patient presents with  . Cough    HPI Casey Jackson is a 66 y.o. male.  Patient is a 66 year old Caucasian male past medical history significant for CAD, NSTEMI, and chronic tobacco abuser who presents to the ED today complaining of a productive cough worsening over the past 5 days. Patient states that "he just needs antibiotic for pneumonia". States he had pneumonia in the past and this feels similar. States he is coughing up green sputum. Patient states that [facility] is [facility] currently. Has been recently outside in the cold and rain. Patient states that he does have chest wall pain that is secondary to cough. He denies any shortness of breath or wheezing. Patient has not tried any medications at home for his symptoms. Has a history of chronic bronchitis in the past. Patient states he smokes 3-5 cigarettes per day. He denies any fever, chills, lightheadedness, dizziness, headache, vision changes, shortness of breath, palpitations, abdominal pain, nausea, emesis, urinary symptoms, paresthesias, lower show any edema, calf tenderness, rashes, syncope.       Past Medical History:  Diagnosis Date  . Coronary artery disease    a. Big NSTEMI 11/2014: cath with surgical disease but patient initially refused CABG. Compromise decision was made to do intervention on the RCA with a bare metal stent with medical therapy for other disease and follow-up as an outpatient.  Casey Jackson. HOH (hard of hearing)   . Hyperlipidemia   . Ischemic cardiomyopathy    a. a. Cath 12/22/14: EF 25%. b. 2D Echo 12/23/14: EF 25-30%, diffuse hypokinesis, akinesis of the entireinferolateral and inferior myocardium, mild MR.  . Myocardial infarction    NSTEMI  . Osteoarthritis   . Pneumonia   . Tobacco abuse   . Transaminitis     Patient Active Problem List   Diagnosis Date Noted   . Pain in the chest   . Palpitations   . ACS (acute coronary syndrome) (HCC) 05/01/2015  . Chest pain 05/01/2015  . Chronic systolic CHF (congestive heart failure) (HCC) 04/27/2015  . Coronary artery disease   . Transaminitis   . Tobacco abuse   . HOH (hard of hearing)   . Osteoarthritis   . Hyperlipidemia LDL goal <70 12/24/2014  . Cardiomyopathy, ischemic 12/24/2014  . NSTEMI (non-ST elevated myocardial infarction) (HCC) 12/22/2014    Past Surgical History:  Procedure Laterality Date  . LEFT HEART CATHETERIZATION WITH CORONARY ANGIOGRAM N/A 12/22/2014   Procedure: LEFT HEART CATHETERIZATION WITH CORONARY ANGIOGRAM;  Surgeon: Corky CraftsJayadeep S Varanasi, MD; LAD 100%, CFX 90%, RI mild dz, RCA 95%/70%, EF 25%, CABG recommended  . PERCUTANEOUS CORONARY STENT INTERVENTION (PCI-S) N/A 12/23/2014   Procedure: PERCUTANEOUS CORONARY STENT INTERVENTION (PCI-S);  Surgeon: Corky CraftsJayadeep S Varanasi, MD; 3.0 x 24 rebel BMS to the RCA        Home Medications    Prior to Admission medications   Medication Sig Start Date End Date Taking? Authorizing Provider  acetaminophen (TYLENOL) 500 MG tablet Take 1 tablet (500 mg total) by mouth every 6 (six) hours as needed. Patient not taking: Reported on 09/27/2016 07/13/16   Cheri FowlerKayla Rose, PA-C  albuterol (PROVENTIL HFA;VENTOLIN HFA) 108 (90 Base) MCG/ACT inhaler Inhale 1-2 puffs into the lungs every 6 (six) hours as needed for wheezing or shortness of breath. 12/03/16   Rise MuKenneth T Ocia Simek, PA-C  aspirin 81 MG  tablet Take 81 mg by mouth daily.    Historical Provider, MD  atorvastatin (LIPITOR) 20 MG tablet Take 1 tablet (20 mg total) by mouth daily. 12/03/16   Rise Mu, PA-C  benzonatate (TESSALON) 100 MG capsule Take 1 capsule (100 mg total) by mouth every 8 (eight) hours. 12/03/16   Rise Mu, PA-C  fluticasone (FLONASE) 50 MCG/ACT nasal spray Place 2 sprays into both nostrils daily. 12/03/16   Rise Mu, PA-C  HYDROcodone-acetaminophen (NORCO)  5-325 MG tablet Take 1-2 tablets by mouth every 4 (four) hours as needed. Patient not taking: Reported on 09/27/2016 05/03/16   Geoffery Lyons, MD  ibuprofen (ADVIL,MOTRIN) 400 MG tablet Take 1 tablet (400 mg total) by mouth every 6 (six) hours as needed. Patient not taking: Reported on 09/27/2016 07/13/16   Cheri Fowler, PA-C  nitroGLYCERIN (NITROSTAT) 0.4 MG SL tablet Place 1 tablet (0.4 mg total) under the tongue every 5 (five) minutes as needed for chest pain. 12/24/14   Rhonda G Barrett, PA-C  prasugrel (EFFIENT) 10 MG TABS tablet Take 1 tablet (10 mg total) by mouth daily. Patient not taking: Reported on 09/27/2016 11/08/15   Azalee Course, PA    Family History Family History  Problem Relation Age of Onset  . Cancer Father   . Hypertension    . Heart attack Neg Hx   . Stroke Neg Hx     Social History Social History  Substance Use Topics  . Smoking status: Current Every Day Smoker    Packs/day: 0.00    Years: 50.00    Types: Cigarettes  . Smokeless tobacco: Current User  . Alcohol use No     Allergies   Penicillins and Ergocalciferol   Review of Systems Review of Systems  Constitutional: Negative for chills and fever.  HENT: Positive for congestion and rhinorrhea. Negative for sore throat.   Eyes: Negative for visual disturbance.  Respiratory: Positive for cough. Negative for shortness of breath and wheezing.   Cardiovascular: Negative for chest pain, palpitations and leg swelling.  Gastrointestinal: Negative for abdominal pain, diarrhea, nausea and vomiting.  Genitourinary: Negative for dysuria, flank pain, frequency, hematuria and urgency.  Musculoskeletal: Negative.   Skin: Negative.   Neurological: Negative for dizziness, syncope, weakness, light-headedness and headaches.  All other systems reviewed and are negative.    Physical Exam Updated Vital Signs BP 138/68 (BP Location: Left Arm)   Pulse 90   Temp 99.7 F (37.6 C) (Oral)   Resp 18   Ht 5\' 11"  (1.803 m)   Wt  81.6 kg   SpO2 95%   BMI 25.10 kg/m   Physical Exam  Constitutional: He is oriented to person, place, and time. He appears well-developed and well-nourished. No distress.  The patient appears older than stated age. He is in no acute distress. He is nontoxic appearing.  HENT:  Head: Normocephalic and atraumatic.  Right Ear: Tympanic membrane, external ear and ear canal normal.  Left Ear: Tympanic membrane, external ear and ear canal normal.  Nose: Rhinorrhea present.  Mouth/Throat: Uvula is midline, oropharynx is clear and moist and mucous membranes are normal.  Eyes: Conjunctivae and EOM are normal. Pupils are equal, round, and reactive to light. Right eye exhibits no discharge. Left eye exhibits no discharge. No scleral icterus.  Neck: Normal range of motion. Neck supple. No thyromegaly present.  Cardiovascular: Normal rate, regular rhythm, normal heart sounds and intact distal pulses.  Exam reveals no gallop and no friction rub.   No  murmur heard. Pulmonary/Chest: Effort normal and breath sounds normal. No respiratory distress. He has no wheezes. He exhibits no tenderness.  The patient is not hypoxic or tachypneic. Scattered coarse sounds heard that clear with cough.  Abdominal: Soft. Bowel sounds are normal. He exhibits no distension. There is no tenderness. There is no rebound and no guarding.  Musculoskeletal: Normal range of motion.  Lymphadenopathy:    He has no cervical adenopathy.  Neurological: He is alert and oriented to person, place, and time.  Skin: Skin is warm and dry. Capillary refill takes less than 2 seconds.  Nursing note and vitals reviewed.    ED Treatments / Results  Labs (all labs ordered are listed, but only abnormal results are displayed) Labs Reviewed  COMPREHENSIVE METABOLIC PANEL - Abnormal; Notable for the following:       Result Value   Glucose, Bld 119 (*)    ALT 12 (*)    All other components within normal limits  CBC WITH DIFFERENTIAL/PLATELET  - Abnormal; Notable for the following:    WBC 12.6 (*)    RBC 4.06 (*)    Neutro Abs 9.8 (*)    Monocytes Absolute 1.1 (*)    All other components within normal limits  URINALYSIS, ROUTINE W REFLEX MICROSCOPIC - Abnormal; Notable for the following:    Hgb urine dipstick SMALL (*)    All other components within normal limits  I-STAT CG4 LACTIC ACID, ED  I-STAT TROPOININ, ED    EKG  EKG Interpretation  Date/Time:  Wednesday January 03 2017 18:35:28 EST Ventricular Rate:  83 PR Interval:  122 QRS Duration: 114 QT Interval:  366 QTC Calculation: 430 R Axis:   -32 Text Interpretation:  Normal sinus rhythm Left axis deviation Incomplete right bundle branch block Septal infarct , age undetermined ST & T wave abnormality, consider inferolateral ischemia Abnormal ECG No significant change since last tracing Confirmed by Kandis Mannan ([id number]) on 01/03/2017 6:57:27 PM Also confirmed by Kandis Mannan ([id number]), editor [place]  CCT, [place] ([id number])  on 01/04/2017 9:30:51 AM       Radiology Dg Chest 2 View  Result Date: 01/03/2017 CLINICAL DATA:  Productive cough. EXAM: CHEST  2 VIEW COMPARISON:  Radiographs of December 03, 2016. FINDINGS: The heart size and mediastinal contours are within normal limits. No pneumothorax or pleural effusion is noted. Stable bibasilar opacities are noted concerning for atelectasis or scarring. The visualized skeletal structures are unremarkable. IMPRESSION: Stable bibasilar atelectasis or scarring. Electronically Signed   By: Lupita Raider, M.D.   On: 01/03/2017 17:31    Procedures Procedures (including critical care time)  Medications Ordered in ED Medications  ipratropium-albuterol (DUONEB) 0.5-2.5 (3) MG/3ML nebulizer solution 3 mL (3 mLs Nebulization Given 01/03/17 1820)  benzonatate (TESSALON) capsule 100 mg (100 mg Oral Given 01/03/17 1831)  predniSONE (DELTASONE) tablet 60 mg (60 mg Oral Given 01/03/17 1914)     Initial Impression / Assessment and  Plan / ED Course  I have reviewed the triage vital signs and the nursing notes.  Pertinent labs & imaging results that were available during my care of the patient were reviewed by me and considered in my medical decision making (see chart for details).     Pt presents with productive cough for 5 days. Pt is a chronic smoker with history of chronic bronchitis. He is afebrile and not tachycardic. Lactic acid normal. Troponin negative. Ekg without any change from prior tracing. Pt is non toxic appearing. Denies any cp or sob.  Low suspicion for PE. Mild leukocytosis of 13. CXR show chronic changes. Given duoneb in the ED with sig improvement in lung sounds. Pt feels much improved and is ready for discharge. Pt able to ambulate in the ED with sat above 90%. Will treat like chronic bronchitis. Pt given abx, steroids, albuterol inhaler and cough medicine. Counceled pt on need to stop smoking. Pt is in nad. Vs are stable. Pt states he is ready for discharge. Dicussed pt with Dr. Corlis Leak who is agreeable to the above plan. Pt is hemodynamically stable, in NAD, & able to ambulate in the ED. Pain has been managed & has no complaints prior to dc. Pt is comfortable with above plan and is stable for discharge at this time. All questions were answered prior to disposition. Strict return precautions for f/u to the ED were discussed.Encoaurged follow up with PCP.   Final Clinical Impressions(s) / ED Diagnoses   Final diagnoses:  Cough  Bronchitis    New Prescriptions Discharge Medication List as of 01/03/2017  7:09 PM    START taking these medications   Details  azithromycin (ZITHROMAX) 500 MG tablet Take 1 tablet (500 mg total) by mouth daily., Starting Wed 01/03/2017, Print    predniSONE (DELTASONE) 20 MG tablet Take 2 tablets (40 mg total) by mouth daily with breakfast., Starting Wed 01/03/2017, Print         Rise Mu, PA-C 01/06/17 1223    Courteney Randall An, MD 01/08/17 [phone]

## 2017-01-03 NOTE — ED Notes (Signed)
Pt stable, understands discharge instructions, and reasons for return.   

## 2017-01-03 NOTE — ED Triage Notes (Signed)
Pt states, "I just need an antibiotic for pneumonia."  C/o productive cough with green phlegm x 5 days.  Denies pain.  Denies sob.

## 2017-01-03 NOTE — Discharge Instructions (Signed)
Please take the prednisone as prescribed starting tomorrow for 3 days. Use the albuterol inhaler as needed. Take the antibiotics which is azithromycin once a day for 3 days. [facility] cough. Please stop smoking as this is causing your symptoms.   Follow these instructions at home: Take over-the-counter and prescription medicines only as told by your health care provider. Use a cool mist humidifier to add humidity to the air in your home. This can make breathing easier. Rest as needed. Drink enough fluid to keep your urine clear or pale yellow. Cover your mouth and nose when you cough or sneeze. Wash your hands with soap and water often, especially after you cough or sneeze. If soap and water are not available, use hand sanitizer. Stay home from work or school as told by your health care provider. Unless you are visiting your health care provider, try to avoid leaving home until your fever has been gone for 24 hours without the use of medicine. Keep all follow-up visits as told by your health care provider. This is important. Contact a health care provider if: You develop new symptoms. You have: Chest pain. Diarrhea. A fever. Your cough gets worse. You produce more mucus. You feel nauseous or you vomit. Get help right away if: You develop shortness of breath or difficulty breathing. Your skin or nails turn a bluish color. You have severe pain or stiffness in your neck. You develop a sudden headache or sudden pain in your face or ear. You cannot stop vomiting.

## 2017-01-03 NOTE — ED Notes (Signed)
Patient transported to x-ray. ?

## 2017-02-22 ENCOUNTER — Emergency Department (HOSPITAL_COMMUNITY)
Admission: EM | Admit: 2017-02-22 | Discharge: 2017-02-23 | Disposition: A | Discharge status: Home / Self Care | Payer: Medicare Other | Attending: Emergency Medicine | Admitting: Emergency Medicine

## 2017-02-22 ENCOUNTER — Other Ambulatory Visit: Payer: Self-pay

## 2017-02-22 ENCOUNTER — Encounter (HOSPITAL_COMMUNITY): Payer: Self-pay | Admitting: Emergency Medicine

## 2017-02-22 ENCOUNTER — Emergency Department (HOSPITAL_COMMUNITY): Payer: Medicare Other

## 2017-02-22 DIAGNOSIS — Z760 Encounter for issue of repeat prescription: Secondary | ICD-10-CM

## 2017-02-22 DIAGNOSIS — Z79899 Other long term (current) drug therapy: Secondary | ICD-10-CM | POA: Insufficient documentation

## 2017-02-22 DIAGNOSIS — R079 Chest pain, unspecified: Secondary | ICD-10-CM

## 2017-02-22 DIAGNOSIS — I2510 Atherosclerotic heart disease of native coronary artery without angina pectoris: Secondary | ICD-10-CM | POA: Insufficient documentation

## 2017-02-22 DIAGNOSIS — I5022 Chronic systolic (congestive) heart failure: Secondary | ICD-10-CM | POA: Insufficient documentation

## 2017-02-22 DIAGNOSIS — R600 Localized edema: Secondary | ICD-10-CM | POA: Diagnosis not present

## 2017-02-22 DIAGNOSIS — Z7982 Long term (current) use of aspirin: Secondary | ICD-10-CM | POA: Diagnosis not present

## 2017-02-22 DIAGNOSIS — I252 Old myocardial infarction: Secondary | ICD-10-CM | POA: Diagnosis not present

## 2017-02-22 DIAGNOSIS — R0789 Other chest pain: Secondary | ICD-10-CM | POA: Insufficient documentation

## 2017-02-22 DIAGNOSIS — R609 Edema, unspecified: Secondary | ICD-10-CM

## 2017-02-22 DIAGNOSIS — F17210 Nicotine dependence, cigarettes, uncomplicated: Secondary | ICD-10-CM | POA: Insufficient documentation

## 2017-02-22 LAB — CBC
HEMATOCRIT: 44.1 % (ref 39.0–52.0)
HEMOGLOBIN: 14.8 g/dL (ref 13.0–17.0)
MCH: 33.3 pg (ref 26.0–34.0)
MCHC: 33.6 g/dL (ref 30.0–36.0)
MCV: 99.3 fL (ref 78.0–100.0)
Platelets: 195 10*3/uL (ref 150–400)
RBC: 4.44 MIL/uL (ref 4.22–5.81)
RDW: 13.5 % (ref 11.5–15.5)
WBC: 8.4 10*3/uL (ref 4.0–10.5)

## 2017-02-22 LAB — BASIC METABOLIC PANEL
Anion gap: 8 (ref 5–15)
BUN: 7 mg/dL (ref 6–20)
CALCIUM: 9 mg/dL (ref 8.9–10.3)
CO2: 27 mmol/L (ref 22–32)
Chloride: 105 mmol/L (ref 101–111)
Creatinine, Ser: 0.94 mg/dL (ref 0.61–1.24)
GFR calc Af Amer: >60 mL/min (ref >60)
GFR calc non Af Amer: >60 mL/min (ref >60)
GLUCOSE: 119 mg/dL — AB (ref 65–99)
Potassium: 4.1 mmol/L (ref 3.5–5.1)
Sodium: 140 mmol/L (ref 135–145)

## 2017-02-22 LAB — I-STAT TROPONIN, ED: Troponin i, poc: 0 ng/mL (ref 0.00–0.08)

## 2017-02-22 MED ORDER — ATORVASTATIN CALCIUM 20 MG PO TABS: 20.0000 mg | ORAL_TABLET | Freq: Every day | ORAL | 0 refills | Status: DC

## 2017-02-22 NOTE — ED Triage Notes (Signed)
Pt has been having bilateral lower leg swelling and chest tightness for 3 days. Tightness is non radiating. Pt states he has been out of his cholesterol medication for 10 days.

## 2017-02-22 NOTE — Discharge Instructions (Signed)
Wear the TED hose during the day elevate leg as much as possible during the day

## 2017-02-22 NOTE — ED Notes (Signed)
Attempted to reassess vitals.; Pt not answering. X3

## 2017-02-22 NOTE — ED Notes (Signed)
Pt waiting for ted hose from [employer]. Pt sts he takes the bus home and the bus stops running at 1130pm so he states he doesn't mind waiting for the next bus in the morning. Pt provided with coffee and sandwich.

## 2017-02-22 NOTE — ED Provider Notes (Signed)
[facility] DEPT Provider Note   CSN: [id number] Arrival date & time: 02/22/17  1515     History   Chief Complaint Chief Complaint  Patient presents with  . Chest Pain    HPI Casey Jackson is a 66 y.o. male.  Patient presents today with 4-5 days of chest tightness, minimal bilateral peripheral edema, ankles to mid shin. Denies any nausea, diaphoresis, shortness of breath. He states that he's been out of his cholesterol medicine for the past 10 days last time he was out of his medicine.  He had exactly the same symptoms resolved since he started taking the cholesterol medicine.  Again. He does not currently have a physician.  He is attempting to get into the community wellness clinic. He states that when he raises his legs at night.  They are back to normal by morning.      Past Medical History:  Diagnosis Date  . Coronary artery disease    a. Big NSTEMI 11/2014: cath with surgical disease but patient initially refused CABG. Compromise decision was made to do intervention on the RCA with a bare metal stent with medical therapy for other disease and follow-up as an outpatient.  [name] HOH (hard of hearing)   . Hyperlipidemia   . Ischemic cardiomyopathy    a. a. Cath 12/22/14: EF 25%. b. 2D Echo 12/23/14: EF 25-30%, diffuse hypokinesis, akinesis of the entireinferolateral and inferior myocardium, mild MR.  . Myocardial infarction    NSTEMI  . Osteoarthritis   . Pneumonia   . Tobacco abuse   . Transaminitis     Patient Active Problem List   Diagnosis Date Noted  . Pain in the chest   . Palpitations   . ACS (acute coronary syndrome) (HCC) 05/01/2015  . Chest pain 05/01/2015  . Chronic systolic CHF (congestive heart failure) (HCC) 04/27/2015  . Coronary artery disease   . Transaminitis   . Tobacco abuse   . HOH (hard of hearing)   . Osteoarthritis   . Hyperlipidemia LDL goal <70 12/24/2014  . Cardiomyopathy, ischemic 12/24/2014  . NSTEMI (non-ST elevated myocardial  infarction) (HCC) 12/22/2014    Past Surgical History:  Procedure Laterality Date  . LEFT HEART CATHETERIZATION WITH CORONARY ANGIOGRAM N/A 12/22/2014   Procedure: LEFT HEART CATHETERIZATION WITH CORONARY ANGIOGRAM;  Surgeon: Corky Crafts, MD; LAD 100%, CFX 90%, RI mild dz, RCA 95%/70%, EF 25%, CABG recommended  . PERCUTANEOUS CORONARY STENT INTERVENTION (PCI-S) N/A 12/23/2014   Procedure: PERCUTANEOUS CORONARY STENT INTERVENTION (PCI-S);  Surgeon: Corky Crafts, MD; 3.0 x 24 rebel BMS to the RCA        Home Medications    Prior to Admission medications   Medication Sig Start Date End Date Taking? Authorizing Provider  albuterol (PROVENTIL HFA;VENTOLIN HFA) 108 (90 Base) MCG/ACT inhaler Inhale 1-2 puffs into the lungs every 6 (six) hours as needed for wheezing or shortness of breath. 01/03/17  Yes Rise Mu, PA-C  aspirin 81 MG tablet Take 81 mg by mouth daily.   Yes Historical Provider, MD  nitroGLYCERIN (NITROSTAT) 0.4 MG SL tablet Place 1 tablet (0.4 mg total) under the tongue every 5 (five) minutes as needed for chest pain. 12/24/14  Yes Rhonda G Barrett, PA-C  acetaminophen (TYLENOL) 500 MG tablet Take 1 tablet (500 mg total) by mouth every 6 (six) hours as needed. Patient not taking: Reported on 09/27/2016 07/13/16   Cheri Fowler, PA-C  atorvastatin (LIPITOR) 20 MG tablet Take 1 tablet (20 mg total) by mouth  daily. 02/22/17 03/24/17  Earley Favor, NP  azithromycin (ZITHROMAX) 500 MG tablet Take 1 tablet (500 mg total) by mouth daily. Patient not taking: Reported on 02/22/2017 01/03/17   Rise Mu, PA-C  benzonatate (TESSALON) 100 MG capsule Take 1 capsule (100 mg total) by mouth every 8 (eight) hours. Patient not taking: Reported on 02/22/2017 01/03/17   Rise Mu, PA-C  fluticasone [facility]) 50 MCG/ACT nasal spray Place 2 sprays into both nostrils daily. Patient not taking: Reported on 02/22/2017 12/03/16   Rise Mu, PA-C  HYDROcodone-acetaminophen  (NORCO) 5-325 MG tablet Take 1-2 tablets by mouth every 4 (four) hours as needed. Patient not taking: Reported on 09/27/2016 05/03/16   Geoffery Lyons, MD  ibuprofen (ADVIL,MOTRIN) 400 MG tablet Take 1 tablet (400 mg total) by mouth every 6 (six) hours as needed. Patient not taking: Reported on 09/27/2016 07/13/16   Cheri Fowler, PA-C  prasugrel (EFFIENT) 10 MG TABS tablet Take 1 tablet (10 mg total) by mouth daily. Patient not taking: Reported on 09/27/2016 11/08/15   Azalee Course, PA  predniSONE (DELTASONE) 20 MG tablet Take 2 tablets (40 mg total) by mouth daily with breakfast. Patient not taking: Reported on 02/22/2017 01/03/17   Rise Mu, PA-C    Family History Family History  Problem Relation Age of Onset  . Cancer Father   . Hypertension    . Heart attack Neg Hx   . Stroke Neg Hx     Social History Social History  Substance Use Topics  . Smoking status: Current Every Day Smoker    Packs/day: 0.00    Years: 50.00    Types: Cigarettes  . Smokeless tobacco: Current User  . Alcohol use No     Allergies   Penicillins and Ergocalciferol   Review of Systems Review of Systems  Constitutional: Negative for chills and fever.  Respiratory: Negative for shortness of breath.   Cardiovascular: Positive for chest pain and leg swelling.  All other systems reviewed and are negative.    Physical Exam Updated Vital Signs BP 132/72   Pulse 66   Temp 98.7 F (37.1 C) (Oral)   Resp (!) 26   Ht 5\' 11"  (1.803 m)   Wt 81.6 kg   SpO2 99%   BMI 25.10 kg/m   Physical Exam  Constitutional: He appears well-developed and well-nourished.  HENT:  Nose: Nose normal.  Eyes: Pupils are equal, round, and reactive to light.  Neck: Normal range of motion.  Cardiovascular: Normal rate.   Pulmonary/Chest: Effort normal.  Abdominal: Soft.  Musculoskeletal: Normal range of motion. He exhibits edema.       Right lower leg: He exhibits edema.       Left lower leg: He exhibits edema.        Legs: Neurological: He is alert.  Nursing note and vitals reviewed.    ED Treatments / Results  Labs (all labs ordered are listed, but only abnormal results are displayed) Labs Reviewed  BASIC METABOLIC PANEL - Abnormal; Notable for the following:       Result Value   Glucose, Bld 119 (*)    All other components within normal limits  CBC  I-STAT TROPOININ, ED    EKG  EKG Interpretation  Date/Time:  Thursday February 22 2017 15:21:19 EDT Ventricular Rate:  83 PR Interval:  130 QRS Duration: 118 QT Interval:  400 QTC Calculation: 470 R Axis:   -49 Text Interpretation:  Normal sinus rhythm Left axis deviation Incomplete right bundle  branch block Septal infarct , age undetermined ST & T wave abnormality, consider lateral ischemia Abnormal ECG No significant change since last tracing Confirmed by FLOYD MD, DANIEL [phone]([id number]) on 02/22/2017 10:17:23 PM       Radiology Dg Chest 2 View  Result Date: 02/22/2017 CLINICAL DATA:  Chest pain.  Ankle swelling.  Smoking history. EXAM: CHEST  2 VIEW COMPARISON:  Two-view chest x-ray 01/03/2017 FINDINGS: Heart size is normal. Mild interstitial prominence is stable. Lungs are hyperinflated. There is no edema or effusion. No focal airspace disease is present. Degenerative changes of the thoracic spine are stable. Remote degenerative changes are also present at the [facility]. IMPRESSION: 1. No acute cardiopulmonary disease or significant interval change. 2. Stable chronic hyperinflation.  Probable COPD. 3. Multilevel degenerative change in the thoracic spine and right AC joint. Electronically Signed   By: Marin Robertshristopher  Mattern M.D.   On: 02/22/2017 15:55    Procedures Procedures (including critical care time)  Medications Ordered in ED Medications - No data to display   Initial Impression / Assessment and Plan / ED Course  I have reviewed the triage vital signs and the nursing notes.  Pertinent labs & imaging results that were available  during my care of the patient were reviewed by me and considered in my medical decision making (see chart for details).     Labs EKG and chest been reviewed all within normal parameters.  EKG is unchanged.  I will prescribe his cholesterol medicine.  Again, recommend follow-up with community wellness follow-up He's also been given a pair of TED hose to wear during the day  Final Clinical Impressions(s) / ED Diagnoses   Final diagnoses:  Nonspecific chest pain  Peripheral edema  Medication refill    New Prescriptions Current Discharge Medication List       Earley FavorGail Margery Szostak, NP 02/22/17 2229    Earley FavorGail Carold Eisner, NP 02/22/17 2246    Melene Planan Floyd, DO 02/22/17 2326

## 2017-02-22 NOTE — ED Notes (Signed)
Pt was given ted hose w. Instructions.

## 2017-02-22 NOTE — ED Notes (Signed)
Called to reassess Pt's vitals. No Answer x's 3.

## 2017-03-14 ENCOUNTER — Encounter (HOSPITAL_COMMUNITY): Payer: Self-pay

## 2017-03-14 ENCOUNTER — Emergency Department (HOSPITAL_COMMUNITY)
Admission: EM | Admit: 2017-03-14 | Discharge: 2017-03-14 | Disposition: A | Discharge status: Home / Self Care | Payer: Medicare Other | Attending: Emergency Medicine | Admitting: Emergency Medicine

## 2017-03-14 ENCOUNTER — Emergency Department (HOSPITAL_COMMUNITY): Payer: Medicare Other

## 2017-03-14 DIAGNOSIS — Z79899 Other long term (current) drug therapy: Secondary | ICD-10-CM | POA: Insufficient documentation

## 2017-03-14 DIAGNOSIS — Z7982 Long term (current) use of aspirin: Secondary | ICD-10-CM | POA: Diagnosis not present

## 2017-03-14 DIAGNOSIS — F17210 Nicotine dependence, cigarettes, uncomplicated: Secondary | ICD-10-CM | POA: Insufficient documentation

## 2017-03-14 DIAGNOSIS — M7989 Other specified soft tissue disorders: Secondary | ICD-10-CM | POA: Diagnosis present

## 2017-03-14 DIAGNOSIS — I2510 Atherosclerotic heart disease of native coronary artery without angina pectoris: Secondary | ICD-10-CM | POA: Insufficient documentation

## 2017-03-14 DIAGNOSIS — I5022 Chronic systolic (congestive) heart failure: Secondary | ICD-10-CM | POA: Diagnosis not present

## 2017-03-14 DIAGNOSIS — I252 Old myocardial infarction: Secondary | ICD-10-CM | POA: Diagnosis not present

## 2017-03-14 DIAGNOSIS — R600 Localized edema: Secondary | ICD-10-CM | POA: Insufficient documentation

## 2017-03-14 LAB — CBC
HCT: 47.8 % (ref 39.0–52.0)
Hemoglobin: 16.1 g/dL (ref 13.0–17.0)
MCH: 33.2 pg (ref 26.0–34.0)
MCHC: 33.7 g/dL (ref 30.0–36.0)
MCV: 98.6 fL (ref 78.0–100.0)
PLATELETS: 220 10*3/uL (ref 150–400)
RBC: 4.85 MIL/uL (ref 4.22–5.81)
RDW: 13.3 % (ref 11.5–15.5)
WBC: 10.3 10*3/uL (ref 4.0–10.5)

## 2017-03-14 LAB — BASIC METABOLIC PANEL
ANION GAP: 8 (ref 5–15)
BUN: 8 mg/dL (ref 6–20)
CALCIUM: 9.2 mg/dL (ref 8.9–10.3)
CO2: 26 mmol/L (ref 22–32)
CREATININE: 0.95 mg/dL (ref 0.61–1.24)
Chloride: 105 mmol/L (ref 101–111)
GFR calc non Af Amer: >60 mL/min (ref >60)
Glucose, Bld: 72 mg/dL (ref 65–99)
Potassium: 4 mmol/L (ref 3.5–5.1)
Sodium: 139 mmol/L (ref 135–145)

## 2017-03-14 LAB — I-STAT TROPONIN, ED: TROPONIN I, POC: 0.01 ng/mL (ref 0.00–0.08)

## 2017-03-14 NOTE — ED Provider Notes (Signed)
[facility] Provider Note   CSN: [id number] Arrival date & time: 03/14/17  1145     History   Chief Complaint Chief Complaint  Patient presents with  . Chest Pain    HPI Casey Jackson is a 66 y.o. male.  HPI Patient presents to the emergency department reporting that he needs a note for his homeless shelter stating that he needs to keep his feet up as much as possible.  He reports last week he had increasing lower extremity swelling that is backed elevating his leg and wearing compression stockings since route.  He noted nursing staff that he had chest pain and dizziness this morning but he reports no chest pain at all to me.  He reports no chest pain shortness of breath at this time or even earlier today.  He states he does have an area of irritation in his left axillary region noted noted since using a new deodorant and he feels like there is a small area of irritation and redness.  He reports no shortness of breath at this time.  No history DVT.  He is currently wearing his compression stockings.  He is focused on obtaining the note for the homeless shelter at this time      Past Medical History:  Diagnosis Date  . Coronary artery disease    a. Big NSTEMI 11/2014: cath with surgical disease but patient initially refused CABG. Compromise decision was made to do intervention on the RCA with a bare metal stent with medical therapy for other disease and follow-up as an outpatient.  [name] HOH (hard of hearing)   . Hyperlipidemia   . Ischemic cardiomyopathy    a. a. Cath 12/22/14: EF 25%. b. 2D Echo 12/23/14: EF 25-30%, diffuse hypokinesis, akinesis of the entireinferolateral and inferior myocardium, mild MR.  . Myocardial infarction [facility])    NSTEMI  . Osteoarthritis   . Pneumonia   . Tobacco abuse   . Transaminitis     Patient Active Problem List   Diagnosis Date Noted  . Pain in the chest   . Palpitations   . ACS (acute coronary syndrome) (HCC) 05/01/2015  . Chest pain  05/01/2015  . Chronic systolic CHF (congestive heart failure) (HCC) 04/27/2015  . Coronary artery disease   . Transaminitis   . Tobacco abuse   . HOH (hard of hearing)   . Osteoarthritis   . Hyperlipidemia LDL goal <70 12/24/2014  . Cardiomyopathy, ischemic 12/24/2014  . NSTEMI (non-ST elevated myocardial infarction) (HCC) 12/22/2014    Past Surgical History:  Procedure Laterality Date  . LEFT HEART CATHETERIZATION WITH CORONARY ANGIOGRAM N/A 12/22/2014   Procedure: LEFT HEART CATHETERIZATION WITH CORONARY ANGIOGRAM;  Surgeon: Corky Crafts, MD; LAD 100%, CFX 90%, RI mild dz, RCA 95%/70%, EF 25%, CABG recommended  . PERCUTANEOUS CORONARY STENT INTERVENTION (PCI-S) N/A 12/23/2014   Procedure: PERCUTANEOUS CORONARY STENT INTERVENTION (PCI-S);  Surgeon: Corky Crafts, MD; 3.0 x 24 rebel BMS to the RCA        Home Medications    Prior to Admission medications   Medication Sig Start Date End Date Taking? Authorizing Provider  acetaminophen (TYLENOL) 500 MG tablet Take 1 tablet (500 mg total) by mouth every 6 (six) hours as needed. Patient not taking: Reported on 09/27/2016 07/13/16   Cheri Fowler, PA-C  albuterol (PROVENTIL HFA;VENTOLIN HFA) 108 (90 Base) MCG/ACT inhaler Inhale 1-2 puffs into the lungs every 6 (six) hours as needed for wheezing or shortness of breath. 01/03/17   Iantha Fallen  T Leaphart, PA-C  aspirin 81 MG tablet Take 81 mg by mouth daily.    Historical Provider, MD  atorvastatin (LIPITOR) 20 MG tablet Take 1 tablet (20 mg total) by mouth daily. 02/22/17 03/24/17  Earley Favor, NP  ibuprofen (ADVIL,MOTRIN) 400 MG tablet Take 1 tablet (400 mg total) by mouth every 6 (six) hours as needed. Patient not taking: Reported on 09/27/2016 07/13/16   Cheri Fowler, PA-C  nitroGLYCERIN (NITROSTAT) 0.4 MG SL tablet Place 1 tablet (0.4 mg total) under the tongue every 5 (five) minutes as needed for chest pain. 12/24/14   Rhonda G Barrett, PA-C  prasugrel (EFFIENT) 10 MG TABS tablet Take 1  tablet (10 mg total) by mouth daily. Patient not taking: Reported on 09/27/2016 11/08/15   Azalee Course, PA    Family History Family History  Problem Relation Age of Onset  . Cancer Father   . Hypertension    . Heart attack Neg Hx   . Stroke Neg Hx     Social History Social History  Substance Use Topics  . Smoking status: Current Every Day Smoker    Packs/day: 0.00    Years: 50.00    Types: Cigarettes  . Smokeless tobacco: Current User  . Alcohol use No     Allergies   Penicillins and Ergocalciferol   Review of Systems Review of Systems  All other systems reviewed and are negative.    Physical Exam Updated Vital Signs BP 133/76   Pulse 69   Temp 98 F (36.7 C) (Oral)   Resp (!) 26   SpO2 99%   Physical Exam  Constitutional: He is oriented to person, place, and time. He appears well-developed and well-nourished.  HENT:  Head: Normocephalic and atraumatic.  Eyes: EOM are normal.  Neck: Normal range of motion.  Cardiovascular: Normal rate, regular rhythm and normal heart sounds.   Pulmonary/Chest: Effort normal and breath sounds normal. No respiratory distress.  Abdominal: Soft. He exhibits no distension. There is no tenderness.  Musculoskeletal: Normal range of motion. He exhibits no edema.  Neurological: He is alert and oriented to person, place, and time.  Skin: Skin is warm and dry.  Psychiatric: He has a normal mood and affect. Judgment normal.  Nursing note and vitals reviewed.    ED Treatments / Results  Labs (all labs ordered are listed, but only abnormal results are displayed) Labs Reviewed  BASIC METABOLIC PANEL  CBC  I-STAT TROPOININ, ED    EKG  EKG Interpretation  Date/Time:  Wednesday March 14 2017 11:52:55 EDT Ventricular Rate:  72 PR Interval:  128 QRS Duration: 120 QT Interval:  420 QTC Calculation: 459 R Axis:   -39 Text Interpretation:  Normal sinus rhythm Left axis deviation Right bundle branch block Septal infarct , age  undetermined T wave abnormality, consider inferior ischemia Abnormal ECG No significant change was found Confirmed by Shirin Echeverry  MD, Caryn Bee ([id number]) on 03/14/2017 11:57:24 AM       Radiology Dg Chest 2 View  Result Date: 03/14/2017 CLINICAL DATA:  Palpable knot in the left axillary region. The patient also reports shortness of breath and lightheadedness for 3 days. History of coronary artery disease, ischemic cardiomyopathy, previous MI. Current smoker. EXAM: CHEST  2 VIEW COMPARISON:  PA and lateral chest x-ray of February 22, 2017 FINDINGS: The lungs are well-expanded. There is no focal infiltrate. The heart and pulmonary vascularity are normal. The mediastinum is normal in width. There is calcification in the wall of the aortic arch. There  is no pleural effusion. The bony thorax is unremarkable. The visualized axillary soft tissues exhibit no acute abnormality. IMPRESSION: There is no pneumonia nor CHF nor other acute cardiopulmonary abnormality. Mild chronic bronchitic-smoking related changes, stable. Thoracic aortic atherosclerosis. Electronically Signed   By: David  [place] M.D.   On: 03/14/2017 14:01    Procedures Procedures (including critical care time)  Medications Ordered in ED Medications - No data to display   Initial Impression / Assessment and Plan / ED Course  I have reviewed the triage vital signs and the nursing notes.  Pertinent labs & imaging results that were available during my care of the patient were reviewed by me and considered in my medical decision making (see chart for details).     Patient without active chest pain or shortness of breath at this time.  EKG labs and chest x-ray without significant abnormality.  I note was given to him to hopefully clarify his health situation to the homeless shelter.  I do think he still okay leaving the homeless shelter during the day he just needs to keep his feet elevated.  Final Clinical Impressions(s) / ED Diagnoses   Final  diagnoses:  Bilateral lower extremity edema    New Prescriptions Current Discharge Medication List       Azalia Bilis, MD 03/14/17 [phone]

## 2017-03-14 NOTE — Discharge Instructions (Signed)
Please rest and elevate your legs as much as possible. It is important that you walk around and exercise, but please do not spend the entire day on your feet.  Watch your salt intake and try to avoid sodas and processed food. Stick with fresh fruits, proteins and vegetables.

## 2017-03-14 NOTE — ED Triage Notes (Signed)
Pt reports he has been having chest pain and dizziness that began this morning. He reports that he has hx of heart disease. Pt also reports swelling bilaterally in the legs.

## 2017-03-14 NOTE — ED Notes (Signed)
Pt given bag lunch and coffee prior to discharge

## 2017-03-21 ENCOUNTER — Emergency Department (HOSPITAL_COMMUNITY): Payer: Medicare Other

## 2017-03-21 ENCOUNTER — Encounter (HOSPITAL_COMMUNITY): Payer: Self-pay

## 2017-03-21 ENCOUNTER — Emergency Department (HOSPITAL_COMMUNITY)
Admission: EM | Admit: 2017-03-21 | Discharge: 2017-03-21 | Disposition: A | Discharge status: Home / Self Care | Payer: Medicare Other | Attending: Emergency Medicine | Admitting: Emergency Medicine

## 2017-03-21 DIAGNOSIS — I252 Old myocardial infarction: Secondary | ICD-10-CM | POA: Insufficient documentation

## 2017-03-21 DIAGNOSIS — Z7982 Long term (current) use of aspirin: Secondary | ICD-10-CM | POA: Diagnosis not present

## 2017-03-21 DIAGNOSIS — Z79899 Other long term (current) drug therapy: Secondary | ICD-10-CM | POA: Diagnosis not present

## 2017-03-21 DIAGNOSIS — I110 Hypertensive heart disease with heart failure: Secondary | ICD-10-CM | POA: Diagnosis not present

## 2017-03-21 DIAGNOSIS — R05 Cough: Secondary | ICD-10-CM | POA: Diagnosis present

## 2017-03-21 DIAGNOSIS — J441 Chronic obstructive pulmonary disease with (acute) exacerbation: Secondary | ICD-10-CM | POA: Diagnosis not present

## 2017-03-21 DIAGNOSIS — I2510 Atherosclerotic heart disease of native coronary artery without angina pectoris: Secondary | ICD-10-CM | POA: Insufficient documentation

## 2017-03-21 DIAGNOSIS — J180 Bronchopneumonia, unspecified organism: Secondary | ICD-10-CM | POA: Diagnosis not present

## 2017-03-21 DIAGNOSIS — F17210 Nicotine dependence, cigarettes, uncomplicated: Secondary | ICD-10-CM | POA: Insufficient documentation

## 2017-03-21 DIAGNOSIS — I5022 Chronic systolic (congestive) heart failure: Secondary | ICD-10-CM | POA: Insufficient documentation

## 2017-03-21 MED ORDER — IPRATROPIUM-ALBUTEROL 0.5-2.5 (3) MG/3ML IN SOLN
3.0000 mL | Freq: Once | RESPIRATORY_TRACT | Status: AC
  Administered 2017-03-21: 3 mL via RESPIRATORY_TRACT
  Filled 2017-03-21: qty 3

## 2017-03-21 MED ORDER — PREDNISONE 10 MG PO TABS: 50.0000 mg | ORAL_TABLET | Freq: Every day | ORAL | 0 refills | Status: DC

## 2017-03-21 MED ORDER — DOXYCYCLINE HYCLATE 100 MG PO TABS
100.0000 mg | ORAL_TABLET | Freq: Once | ORAL | Status: AC
  Administered 2017-03-21: 100 mg via ORAL
  Filled 2017-03-21: qty 1

## 2017-03-21 MED ORDER — ALBUTEROL SULFATE HFA 108 (90 BASE) MCG/ACT IN AERS
2.0000 | INHALATION_SPRAY | Freq: Once | RESPIRATORY_TRACT | Status: AC
  Administered 2017-03-21: 2 via RESPIRATORY_TRACT
  Filled 2017-03-21: qty 6.7

## 2017-03-21 MED ORDER — PREDNISONE 20 MG PO TABS
60.0000 mg | ORAL_TABLET | Freq: Once | ORAL | Status: AC
  Administered 2017-03-21: 60 mg via ORAL
  Filled 2017-03-21: qty 3

## 2017-03-21 MED ORDER — DOXYCYCLINE HYCLATE 100 MG PO CAPS: 100.0000 mg | ORAL_CAPSULE | Freq: Two times a day (BID) | ORAL | 0 refills | Status: DC

## 2017-03-21 NOTE — ED Triage Notes (Addendum)
Recent "head cold" and has developed cough for 2 days. Pt reports coughing up green thick mucus. Denies sob or pain. Pt reports he is living in homeless shelter right now.

## 2017-03-21 NOTE — ED Provider Notes (Signed)
[facility] DEPT Provider Note   CSN: [id number] Arrival date & time: 03/21/17  1628     History   Chief Complaint Chief Complaint  Patient presents with  . Cough    HPI Casey Jackson is a 66 y.o. male.  HPI A 66 year old male who presents with productive cough for 2 days. He has a history of ischemic cardiomyopathy with EF of 25-30%, coronary artery disease, hypertension and hyperlipidemia. Currently homeless and staying at a shelter but denies any known sick contacts. States initially had a "head cold, of headache congestion and runny nose. Over past 2 days with productive cough of yellow to green sputum that is thick. Denies any shortness of breath or chest pain. No lower extremity edema orthopnea or PND. Does endorse tobacco usage daily.   Past Medical History:  Diagnosis Date  . Coronary artery disease    a. Big NSTEMI 11/2014: cath with surgical disease but patient initially refused CABG. Compromise decision was made to do intervention on the RCA with a bare metal stent with medical therapy for other disease and follow-up as an outpatient.  [name] HOH (hard of hearing)   . Hyperlipidemia   . Ischemic cardiomyopathy    a. a. Cath 12/22/14: EF 25%. b. 2D Echo 12/23/14: EF 25-30%, diffuse hypokinesis, akinesis of the entireinferolateral and inferior myocardium, mild MR.  . Myocardial infarction [facility])    NSTEMI  . Osteoarthritis   . Pneumonia   . Tobacco abuse   . Transaminitis     Patient Active Problem List   Diagnosis Date Noted  . Pain in the chest   . Palpitations   . ACS (acute coronary syndrome) (HCC) 05/01/2015  . Chest pain 05/01/2015  . Chronic systolic CHF (congestive heart failure) (HCC) 04/27/2015  . Coronary artery disease   . Transaminitis   . Tobacco abuse   . HOH (hard of hearing)   . Osteoarthritis   . Hyperlipidemia LDL goal <70 12/24/2014  . Cardiomyopathy, ischemic 12/24/2014  . NSTEMI (non-ST elevated myocardial infarction) (HCC) 12/22/2014     Past Surgical History:  Procedure Laterality Date  . LEFT HEART CATHETERIZATION WITH CORONARY ANGIOGRAM N/A 12/22/2014   Procedure: LEFT HEART CATHETERIZATION WITH CORONARY ANGIOGRAM;  Surgeon: Corky Crafts, MD; LAD 100%, CFX 90%, RI mild dz, RCA 95%/70%, EF 25%, CABG recommended  . PERCUTANEOUS CORONARY STENT INTERVENTION (PCI-S) N/A 12/23/2014   Procedure: PERCUTANEOUS CORONARY STENT INTERVENTION (PCI-S);  Surgeon: Corky Crafts, MD; 3.0 x 24 rebel BMS to the RCA        Home Medications    Prior to Admission medications   Medication Sig Start Date End Date Taking? Authorizing Provider  acetaminophen (TYLENOL) 500 MG tablet Take 1 tablet (500 mg total) by mouth every 6 (six) hours as needed. Patient not taking: Reported on 09/27/2016 07/13/16   Cheri Fowler, PA-C  albuterol (PROVENTIL HFA;VENTOLIN HFA) 108 (90 Base) MCG/ACT inhaler Inhale 1-2 puffs into the lungs every 6 (six) hours as needed for wheezing or shortness of breath. 01/03/17   Rise Mu, PA-C  aspirin 81 MG tablet Take 81 mg by mouth daily.    Historical Provider, MD  atorvastatin (LIPITOR) 20 MG tablet Take 1 tablet (20 mg total) by mouth daily. 02/22/17 03/24/17  Earley Favor, NP  doxycycline (VIBRAMYCIN) 100 MG capsule Take 1 capsule (100 mg total) by mouth 2 (two) times daily. 03/21/17   Lavera Guise, MD  ibuprofen (ADVIL,MOTRIN) 400 MG tablet Take 1 tablet (400 mg total) by mouth  every 6 (six) hours as needed. Patient not taking: Reported on 09/27/2016 07/13/16   Cheri Fowler, PA-C  nitroGLYCERIN (NITROSTAT) 0.4 MG SL tablet Place 1 tablet (0.4 mg total) under the tongue every 5 (five) minutes as needed for chest pain. 12/24/14   Rhonda G Barrett, PA-C  prasugrel (EFFIENT) 10 MG TABS tablet Take 1 tablet (10 mg total) by mouth daily. Patient not taking: Reported on 09/27/2016 11/08/15   Azalee Course, PA  predniSONE (DELTASONE) 10 MG tablet Take 5 tablets (50 mg total) by mouth daily. 03/22/17   Lavera Guise, MD     Family History Family History  Problem Relation Age of Onset  . Cancer Father   . Hypertension    . Heart attack Neg Hx   . Stroke Neg Hx     Social History Social History  Substance Use Topics  . Smoking status: Current Every Day Smoker    Packs/day: 0.00    Years: 50.00    Types: Cigarettes  . Smokeless tobacco: Current User  . Alcohol use No     Allergies   Penicillins and Ergocalciferol   Review of Systems Review of Systems  Constitutional: Positive for fever.  Respiratory: Positive for cough. Negative for shortness of breath.   Cardiovascular: Negative for chest pain.  Gastrointestinal: Negative for abdominal pain.  Genitourinary: Negative for difficulty urinating.  Neurological: Positive for headaches.  Hematological: Bruises/bleeds easily.  All other systems reviewed and are negative.    Physical Exam Updated Vital Signs BP 132/73   Pulse 81   Temp 99.7 F (37.6 C) (Oral)   Resp 16   Ht  (1.803 m)   Wt 180 lb (81.6 kg)   SpO2 96%   BMI 25.10 kg/m   Physical Exam Physical Exam  Nursing note and vitals reviewed. Constitutional: non-toxic, and in no acute distress Head: Normocephalic and atraumatic.  Mouth/Throat: Oropharynx is clear and moist.  Neck: Normal range of motion. Neck supple.  Cardiovascular: Normal rate and regular rhythm.   Pulmonary/Chest: Effort normal. No conversational dyspnea. Scattered expiratory wheezing. Abdominal: Soft. There is no tenderness. There is no rebound and no guarding.  Musculoskeletal: Normal range of motion.  no lower extremity edema  Neurological: Alert, no facial droop, fluent speech, moves all extremities symmetrically Skin: Skin is warm and dry.  Psychiatric: Cooperative   ED Treatments / Results  Labs (all labs ordered are listed, but only abnormal results are displayed) Labs Reviewed - No data to display  EKG  EKG Interpretation None       Radiology Dg Chest 2 View  Result Date:  03/21/2017 CLINICAL DATA:  Productive cough EXAM: CHEST  2 VIEW COMPARISON:  03/15/2007 FINDINGS: Hyperinflation evident with mild streaky left lower lobe bronchovascular opacities suspicious for mild left lower lobe pneumonia. Right lung remains clear. Negative for edema, effusion or pneumothorax. Trachea is midline. Normal heart size and vascularity. Degenerative changes of the spine. IMPRESSION: Hyperinflation with slight increased left lower lobe retrocardiac streaky bronchovascular opacities suspicious for developing bronchopneumonia. Electronically Signed   By: Judie Petit.  Shick M.D.   On: 03/21/2017 17:08    Procedures Procedures (including critical care time)  Medications Ordered in ED Medications  ipratropium-albuterol (DUONEB) 0.5-2.5 (3) MG/3ML nebulizer solution 3 mL (3 mLs Nebulization Given 03/21/17 1758)  albuterol (PROVENTIL HFA;VENTOLIN HFA) 108 (90 Base) MCG/ACT inhaler 2 puff (2 puffs Inhalation Given 03/21/17 1812)  predniSONE (DELTASONE) tablet 60 mg (60 mg Oral Given 03/21/17 1811)  doxycycline (VIBRA-TABS) tablet 100 mg (  100 mg Oral Given 03/21/17 1811)     Initial Impression / Assessment and Plan / ED Course  I have reviewed the triage vital signs and the nursing notes.  Pertinent labs & imaging results that were available during my care of the patient were reviewed by me and considered in my medical decision making (see chart for details).     Chest x-ray visualized. Reviewed by radiology. There is some early signs of potential bronchopneumonia in retrocardiac space. There is no pulmonary edema, and he clinically does not have symptoms of CHF. He is not endorsing any shortness of breath or chest pain. He is also afebrile, without systemic symptoms of illness. Ambulates with normal oxygenationShortness of breath.  Will treat for mild COPD exacerbation with breathing treatments, steroids. Will receive doxycycline for community acquired pneumonia. At this time is felt stable for  outpatient management. Strict return and follow-up instructions reviewed. He expressed understanding of all discharge instructions and felt comfortable with the plan of care.   Final Clinical Impressions(s) / ED Diagnoses   Final diagnoses:  Bronchopneumonia  COPD with acute exacerbation (HCC)    New Prescriptions New Prescriptions   DOXYCYCLINE (VIBRAMYCIN) 100 MG CAPSULE    Take 1 capsule (100 mg total) by mouth 2 (two) times daily.   PREDNISONE (DELTASONE) 10 MG TABLET    Take 5 tablets (50 mg total) by mouth daily.     Lavera Guise, MD 03/21/17 [id number]

## 2017-03-21 NOTE — Discharge Instructions (Signed)
You have mild pneumonia on your chest x-ray please take antibiotics as prescribed. Please also use her inhaler every 4 hours as needed for cough and shortness of breath, and take steroids for mild COPD flareup.  Return without fail for worsening symptoms, including fever, difficulty breathing, passing out, severe chest pains, or any other symptoms concerning to you.

## 2017-03-21 NOTE — ED Notes (Signed)
Pt ambulated with pulse ox monitoring, spO2 97%-98%. NAD. Gait steady.

## 2017-04-18 ENCOUNTER — Emergency Department (HOSPITAL_COMMUNITY): Payer: Medicare Other

## 2017-04-18 ENCOUNTER — Emergency Department (HOSPITAL_COMMUNITY)
Admission: EM | Admit: 2017-04-18 | Discharge: 2017-04-18 | Disposition: A | Discharge status: Home / Self Care | Payer: Medicare Other | Attending: Emergency Medicine | Admitting: Emergency Medicine

## 2017-04-18 ENCOUNTER — Encounter (HOSPITAL_COMMUNITY): Payer: Self-pay

## 2017-04-18 DIAGNOSIS — Z760 Encounter for issue of repeat prescription: Secondary | ICD-10-CM | POA: Diagnosis not present

## 2017-04-18 DIAGNOSIS — Z79899 Other long term (current) drug therapy: Secondary | ICD-10-CM | POA: Diagnosis not present

## 2017-04-18 DIAGNOSIS — I2510 Atherosclerotic heart disease of native coronary artery without angina pectoris: Secondary | ICD-10-CM | POA: Insufficient documentation

## 2017-04-18 DIAGNOSIS — Z7982 Long term (current) use of aspirin: Secondary | ICD-10-CM | POA: Diagnosis not present

## 2017-04-18 DIAGNOSIS — I5022 Chronic systolic (congestive) heart failure: Secondary | ICD-10-CM | POA: Insufficient documentation

## 2017-04-18 DIAGNOSIS — J449 Chronic obstructive pulmonary disease, unspecified: Secondary | ICD-10-CM | POA: Diagnosis not present

## 2017-04-18 DIAGNOSIS — I252 Old myocardial infarction: Secondary | ICD-10-CM | POA: Diagnosis not present

## 2017-04-18 DIAGNOSIS — R05 Cough: Secondary | ICD-10-CM

## 2017-04-18 DIAGNOSIS — R059 Cough, unspecified: Secondary | ICD-10-CM

## 2017-04-18 DIAGNOSIS — R079 Chest pain, unspecified: Secondary | ICD-10-CM | POA: Diagnosis not present

## 2017-04-18 DIAGNOSIS — F17210 Nicotine dependence, cigarettes, uncomplicated: Secondary | ICD-10-CM | POA: Diagnosis not present

## 2017-04-18 LAB — BASIC METABOLIC PANEL
Anion gap: 9 (ref 5–15)
BUN: 12 mg/dL (ref 6–20)
CALCIUM: 9.5 mg/dL (ref 8.9–10.3)
CO2: 23 mmol/L (ref 22–32)
Chloride: 105 mmol/L (ref 101–111)
Creatinine, Ser: 1.02 mg/dL (ref 0.61–1.24)
GFR calc Af Amer: >60 mL/min (ref >60)
GLUCOSE: 116 mg/dL — AB (ref 65–99)
POTASSIUM: 4.7 mmol/L (ref 3.5–5.1)
Sodium: 137 mmol/L (ref 135–145)

## 2017-04-18 LAB — CBC
HEMATOCRIT: 49.3 % (ref 39.0–52.0)
Hemoglobin: 16.8 g/dL (ref 13.0–17.0)
MCH: 33.5 pg (ref 26.0–34.0)
MCHC: 34.1 g/dL (ref 30.0–36.0)
MCV: 98.4 fL (ref 78.0–100.0)
Platelets: 211 10*3/uL (ref 150–400)
RBC: 5.01 MIL/uL (ref 4.22–5.81)
RDW: 13.4 % (ref 11.5–15.5)
WBC: 12.5 10*3/uL — ABNORMAL HIGH (ref 4.0–10.5)

## 2017-04-18 LAB — I-STAT TROPONIN, ED
TROPONIN I, POC: 0.01 ng/mL (ref 0.00–0.08)
TROPONIN I, POC: 0 ng/mL (ref 0.00–0.08)

## 2017-04-18 MED ORDER — PREDNISONE 20 MG PO TABS
60.0000 mg | ORAL_TABLET | Freq: Once | ORAL | Status: AC
  Administered 2017-04-18: 60 mg via ORAL
  Filled 2017-04-18: qty 3

## 2017-04-18 MED ORDER — ALBUTEROL SULFATE HFA 108 (90 BASE) MCG/ACT IN AERS: 1.0000 | INHALATION_SPRAY | Freq: Four times a day (QID) | RESPIRATORY_TRACT | 0 refills | Status: DC | PRN

## 2017-04-18 MED ORDER — LEVOFLOXACIN 750 MG PO TABS: 750.0000 mg | ORAL_TABLET | Freq: Every day | ORAL | 0 refills | Status: DC

## 2017-04-18 MED ORDER — PREDNISONE 20 MG PO TABS: ORAL_TABLET | ORAL | 0 refills | Status: DC

## 2017-04-18 MED ORDER — ATORVASTATIN CALCIUM 20 MG PO TABS
20.0000 mg | ORAL_TABLET | Freq: Every day | ORAL | Status: DC
  Administered 2017-04-18: 20 mg via ORAL
  Filled 2017-04-18: qty 1

## 2017-04-18 MED ORDER — ALBUTEROL SULFATE (2.5 MG/3ML) 0.083% IN NEBU
5.0000 mg | INHALATION_SOLUTION | Freq: Once | RESPIRATORY_TRACT | Status: AC
  Administered 2017-04-18: 5 mg via RESPIRATORY_TRACT
  Filled 2017-04-18: qty 6

## 2017-04-18 MED ORDER — LOPERAMIDE HCL 2 MG PO CAPS
2.0000 mg | ORAL_CAPSULE | Freq: Once | ORAL | Status: AC
Start: 2017-04-18 — End: 2017-04-18
  Administered 2017-04-18: 2 mg via ORAL
  Filled 2017-04-18: qty 1

## 2017-04-18 MED ORDER — ATORVASTATIN CALCIUM 20 MG PO TABS
20.0000 mg | ORAL_TABLET | Freq: Every day | ORAL | 1 refills | Status: DC
Start: 2017-04-18 — End: 2018-03-13

## 2017-04-18 MED ORDER — IPRATROPIUM BROMIDE 0.02 % IN SOLN
0.5000 mg | Freq: Once | RESPIRATORY_TRACT | Status: AC
  Administered 2017-04-18: 0.5 mg via RESPIRATORY_TRACT
  Filled 2017-04-18: qty 2.5

## 2017-04-18 NOTE — ED Provider Notes (Signed)
Medical screening examination/treatment/procedure(s) were conducted as a shared visit with non-physician practitioner(s) and myself.  I personally evaluated the patient during the encounter.   EKG Interpretation  Date/Time:  Wednesday Apr 18 2017 10:55:39 EDT Ventricular Rate:  94 PR Interval:  124 QRS Duration: 114 QT Interval:  386 QTC Calculation: 482 R Axis:   -59 Text Interpretation:  Normal sinus rhythm Left axis deviation Incomplete right bundle branch block Septal infarct , age undetermined Abnormal ECG No significant change since last tracing Confirmed by Hollie Bartus  MD, Author [place]ad ([id number]) on 04/18/2017 1:51:6746 PM      66 year old male possesses cough and congestion which is been intermittent times several weeks. Patient's EKG is unchanged. Has history of pneumonia is concerning have recurrence of this. Denies any anginal type symptoms. Patient had a delta troponin and likely discharge home.   Lorre NickAllen, Zakariah Urwin, MD 04/18/17 1421

## 2017-04-18 NOTE — ED Provider Notes (Signed)
[facility]PT Provider Note   CSN: [id number] Arrival date & time: 04/18/17  1046     History   Chief Complaint Chief Complaint  Patient presents with  . Chest Pain    HPI Casey Jackson is a 66 y.o. male.  The history is provided by the patient and medical records.  Chest Pain   Associated symptoms include cough and nausea.    66 year old male with history of coronary artery disease, hyperlipidemia, ischemic cardiomyopathy with estimated EF of 25-30%, presenting to the ED with chest pain. Patient reports about a month ago he was diagnosed with pneumonia. He was sent home with 10 days of doxycycline which he reports he took as directed. States afterwards he felt mildly better but never returned back to baseline. States he is continued having a productive cough with thick, white sputum. States he continues to feel wheezy with some "rattles" in his chest. He has not had any significant shortness of breath. States yesterday he began feeling like he was "catching a head cold" and this morning around 5 AM he began having some dull, aching pain in the center of his chest. States it feels like a toothache.  States feels vastly different than his prior "heart attack" pain, rather feels more like when he had pneumonia.  States he has had some nausea and sweats, but thinks that this is from food he ate last night. Patient states he is currently [facility] at her [facility] and ate a hamburger patty last night which was not all the way done. States yesterday evening he started having some nausea and diarrhea, has had approximately 20 episodes of loose, watery stool since onset. He denies any current abdominal pain. Denies fever.  Past Medical History:  Diagnosis Date  . Coronary artery disease    a. Big NSTEMI 11/2014: cath with surgical disease but patient initially refused CABG. Compromise decision was made to do intervention on the RCA with a bare metal stent with medical therapy for other  disease and follow-up as an outpatient.  [name] HOH (hard of hearing)   . Hyperlipidemia   . Ischemic cardiomyopathy    a. a. Cath 12/22/14: EF 25%. b. 2D Echo 12/23/14: EF 25-30%, diffuse hypokinesis, akinesis of the entireinferolateral and inferior myocardium, mild MR.  . Myocardial infarction [facility])    NSTEMI  . Osteoarthritis   . Pneumonia   . Tobacco abuse   . Transaminitis     Patient Active Problem List   Diagnosis Date Noted  . Pain in the chest   . Palpitations   . ACS (acute coronary syndrome) (HCC) 05/01/2015  . Chest pain 05/01/2015  . Chronic systolic CHF (congestive heart failure) (HCC) 04/27/2015  . Coronary artery disease   . Transaminitis   . Tobacco abuse   . HOH (hard of hearing)   . Osteoarthritis   . Hyperlipidemia LDL goal <70 12/24/2014  . Cardiomyopathy, ischemic 12/24/2014  . NSTEMI (non-ST elevated myocardial infarction) (HCC) 12/22/2014    Past Surgical History:  Procedure Laterality Date  . LEFT HEART CATHETERIZATION WITH CORONARY ANGIOGRAM N/A 12/22/2014   Procedure: LEFT HEART CATHETERIZATION WITH CORONARY ANGIOGRAM;  Surgeon: Corky Crafts, MD; LAD 100%, CFX 90%, RI mild dz, RCA 95%/70%, EF 25%, CABG recommended  . PERCUTANEOUS CORONARY STENT INTERVENTION (PCI-S) N/A 12/23/2014   Procedure: PERCUTANEOUS CORONARY STENT INTERVENTION (PCI-S);  Surgeon: Corky Crafts, MD; 3.0 x 24 rebel BMS to the RCA        Home Medications    Prior  to Admission medications   Medication Sig Start Date End Date Taking? Authorizing Provider  acetaminophen (TYLENOL) 500 MG tablet Take 1 tablet (500 mg total) by mouth every 6 (six) hours as needed. Patient not taking: Reported on 09/27/2016 07/13/16   Cheri Fowler, PA-C  albuterol (PROVENTIL HFA;VENTOLIN HFA) 108 (90 Base) MCG/ACT inhaler Inhale 1-2 puffs into the lungs every 6 (six) hours as needed for wheezing or shortness of breath. 01/03/17   Rise Mu, PA-C  aspirin 81 MG tablet Take 81 mg by mouth  daily.    [provider]  atorvastatin (LIPITOR) 20 MG tablet Take 1 tablet (20 mg total) by mouth daily. 02/22/17 03/24/17  Earley Favor, NP  doxycycline (VIBRAMYCIN) 100 MG capsule Take 1 capsule (100 mg total) by mouth 2 (two) times daily. 03/21/17   Lavera Guise, MD  ibuprofen (ADVIL,MOTRIN) 400 MG tablet Take 1 tablet (400 mg total) by mouth every 6 (six) hours as needed. Patient not taking: Reported on 09/27/2016 07/13/16   Cheri Fowler, PA-C  nitroGLYCERIN (NITROSTAT) 0.4 MG SL tablet Place 1 tablet (0.4 mg total) under the tongue every 5 (five) minutes as needed for chest pain. 12/24/14   Barrett, Joline Salt, PA-C  prasugrel (EFFIENT) 10 MG TABS tablet Take 1 tablet (10 mg total) by mouth daily. Patient not taking: Reported on 09/27/2016 11/08/15   Azalee Course, PA  predniSONE (DELTASONE) 10 MG tablet Take 5 tablets (50 mg total) by mouth daily. 03/22/17   Lavera Guise, MD    Family History Family History  Problem Relation Age of Onset  . Cancer Father   . Hypertension Unknown   . Heart attack Neg Hx   . Stroke Neg Hx     Social History Social History  Substance Use Topics  . Smoking status: Current Every Day Smoker    Packs/day: 0.00    Years: 50.00    Types: Cigarettes  . Smokeless tobacco: Current User  . Alcohol use No     Allergies   Penicillins and Ergocalciferol   Review of Systems Review of Systems  Respiratory: Positive for cough and wheezing.   Cardiovascular: Positive for chest pain.  Gastrointestinal: Positive for diarrhea and nausea.  All other systems reviewed and are negative.    Physical Exam Updated Vital Signs BP 135/83   Pulse 70   Temp 97.7 F (36.5 C) (Oral)   Resp (!) 25   Ht 5\' 11"  (1.803 m)   Wt 81.6 kg (180 lb)   SpO2 98%   BMI 25.10 kg/m   Physical Exam  Constitutional: He is oriented to person, place, and time. He appears well-developed and well-nourished.  Elderly, hard of hearing  HENT:  Head: Normocephalic and  atraumatic.  Mouth/Throat: Oropharynx is clear and moist.  Eyes: Conjunctivae and EOM are normal. Pupils are equal, round, and reactive to light.  Neck: Normal range of motion.  Cardiovascular: Normal rate, regular rhythm and normal heart sounds.   Pulmonary/Chest: Effort normal. No respiratory distress. He has wheezes. He has rhonchi.  Intermixed wheezes and rhonchi throughout, no acute distress, speaking in full sentences without issue, O2 sats stable during exam  Abdominal: Soft. Bowel sounds are normal.  Musculoskeletal: Normal range of motion.  Neurological: He is alert and oriented to person, place, and time.  Skin: Skin is warm and dry.  Psychiatric: He has a normal mood and affect.  Nursing note and vitals reviewed.    ED Treatments / Results  Labs (all labs ordered  are listed, but only abnormal results are displayed) Labs Reviewed  BASIC METABOLIC PANEL - Abnormal; Notable for the following:       Result Value   Glucose, Bld 116 (*)    All other components within normal limits  CBC - Abnormal; Notable for the following:    WBC 12.5 (*)    All other components within normal limits  I-STAT TROPOININ, ED  I-STAT TROPOININ, ED    EKG  EKG Interpretation  Date/Time:  Wednesday Apr 18 2017 10:55:39 EDT Ventricular Rate:  94 PR Interval:  124 QRS Duration: 114 QT Interval:  386 QTC Calculation: 482 R Axis:   -59 Text Interpretation:  Normal sinus rhythm Left axis deviation Incomplete right bundle branch block Septal infarct , age undetermined Abnormal ECG No significant change since last tracing Confirmed by Freida Busman  MD, ANTHONY ([id number]) on 04/18/2017 1:51:46 PM       Radiology Dg Chest 2 View  Result Date: 04/18/2017 CLINICAL DATA:  Chest pain. EXAM: CHEST  2 VIEW COMPARISON:  Radiographs of March 21, 2017. FINDINGS: The heart size and mediastinal contours are within normal limits. Both lungs are clear. No pneumothorax or pleural effusion is noted. The visualized  skeletal structures are unremarkable. IMPRESSION: No active cardiopulmonary disease. Electronically Signed   By: Lupita Raider, M.D.   On: 04/18/2017 11:50    Procedures Procedures (including critical care time)  Medications Ordered in ED Medications  atorvastatin (LIPITOR) tablet 20 mg (20 mg Oral Given 04/18/17 1426)  albuterol (PROVENTIL) (2.5 MG/3ML) 0.083% nebulizer solution 5 mg (5 mg Nebulization Given 04/18/17 1344)  ipratropium (ATROVENT) nebulizer solution 0.5 mg (0.5 mg Nebulization Given 04/18/17 1344)  predniSONE (DELTASONE) tablet 60 mg (60 mg Oral Given 04/18/17 1344)  loperamide (IMODIUM) capsule 2 mg (2 mg Oral Given 04/18/17 1344)     Initial Impression / Assessment and Plan / ED Course  I have reviewed the triage vital signs and the nursing notes.  Pertinent labs & imaging results that were available during my care of the patient were reviewed by me and considered in my medical decision making (see chart for details).  66 year old male here with chest pain. Reports feeling he was getting a "cold" yesterday. Has been having a cough for the past few weeks after being diagnosed with pneumonia 1 month ago.  He is afebrile and nontoxic in appearance here. Diffuse rhonchi and wheezes on exam but in no acute distress. No hypoxia. EKG is unchanged from prior. Initial lab work overall reassuring, mild leukocytosis at 12.5.   Trop negative.  CXR clear.  Patient was treated here with neb treatment, prednisone and reports symptoms are improved. His vitals remain stable. He was given his home dose of Lipitor. Patient does have some noted cardiac risk factors, therefore delta troponin was obtained which remains negative. At this time I have lower suspicion for ACS, PE, dissection, acute cardiac event. Feel this may be a recurrence of his pneumonia given his persistent productive cough and white count today. He has high risk as he lives in a shelter and has history of COPD. Will restart  antibiotics, prednisone, continue albuterol inhaler. I've refilled his Lipitor. Reports he has upcoming PCP appointment in one week.  Discussed plan with patient, he/she acknowledged understanding and agreed with plan of care.  Return precautions given for new or worsening symptoms.  Patient seen and evaluated with attending physician, Dr. Freida Busman, who agrees with assessment and plan of care.  Final Clinical Impressions(s) / ED  Diagnoses   Final diagnoses:  Chest pain, unspecified type  Cough  Medication refill    New Prescriptions New Prescriptions   ALBUTEROL (PROVENTIL HFA;VENTOLIN HFA) 108 (90 BASE) MCG/ACT INHALER    Inhale 1-2 puffs into the lungs every 6 (six) hours as needed for wheezing.   ATORVASTATIN (LIPITOR) 20 MG TABLET    Take 1 tablet (20 mg total) by mouth daily.   LEVOFLOXACIN (LEVAQUIN) 750 MG TABLET    Take 1 tablet (750 mg total) by mouth daily.   PREDNISONE (DELTASONE) 20 MG TABLET    Take 40 mg by mouth daily for 3 days, then 20mg  by mouth daily for 3 days, then 10mg  daily for 3 days     Garlon HatchetSanders, Ken Bonn M, PA-C 04/18/17 1524

## 2017-04-18 NOTE — Discharge Instructions (Signed)
Take the prescribed medication as directed. Follow-up with your primary care doctor as soon as possible. Return to the ED for new or worsening symptoms.

## 2017-04-18 NOTE — ED Triage Notes (Signed)
Per Pt, Pt has been out of his cholesterol medicine for two weeks. Reports waking up to mid-center aching chest pain that started this morning around 0500. Pt reports some SOB and lightheadedness with the chest pain along with diarrhea. Denies N/V.

## 2017-07-12 IMAGING — CR DG CHEST 2V
2 series · 2 of 2 positions shown · non-contrast
Comparison: Radiographs March 21, 2017.

CLINICAL DATA: Chest pain.

EXAM:
CHEST  2 VIEW

[chest pa]
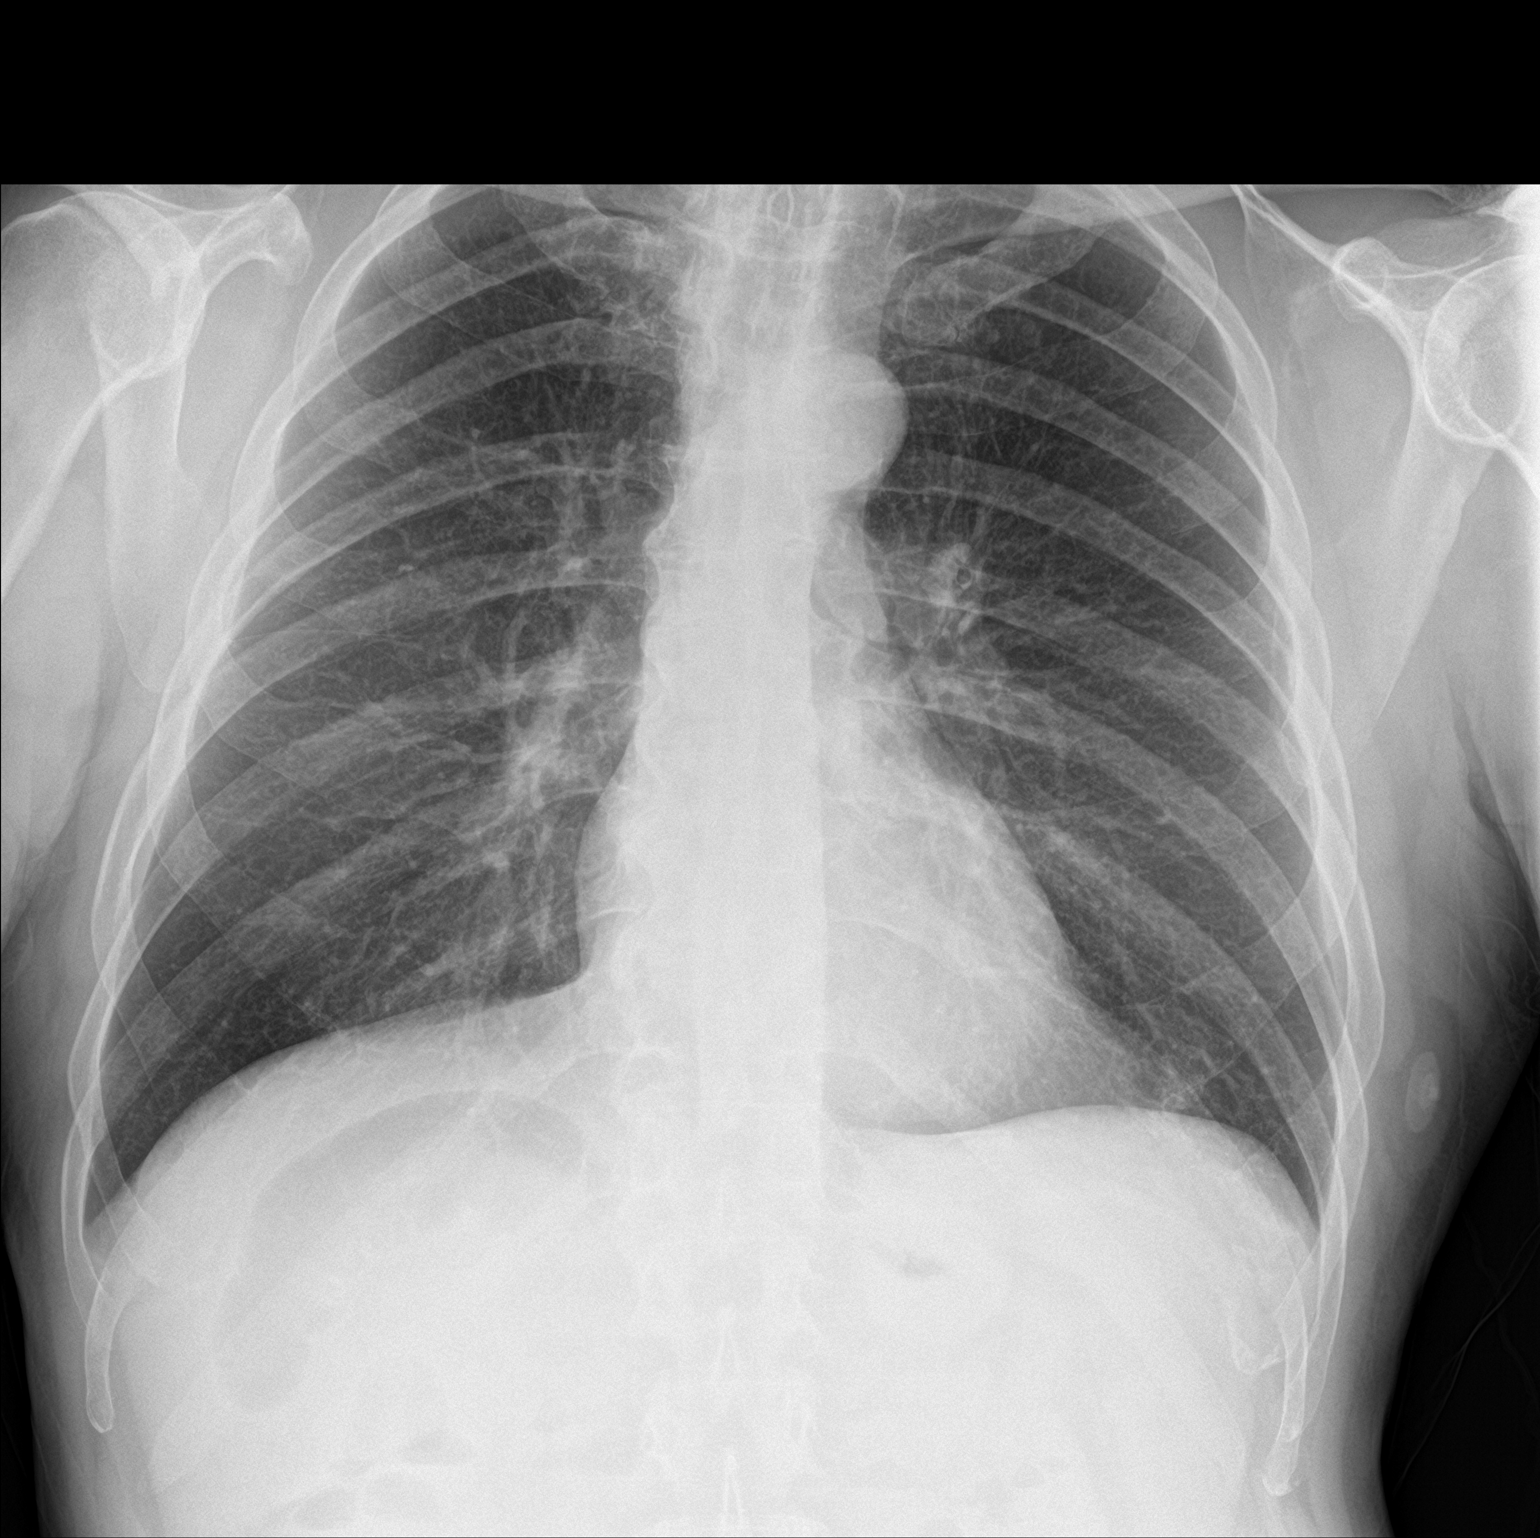

[chest lat]
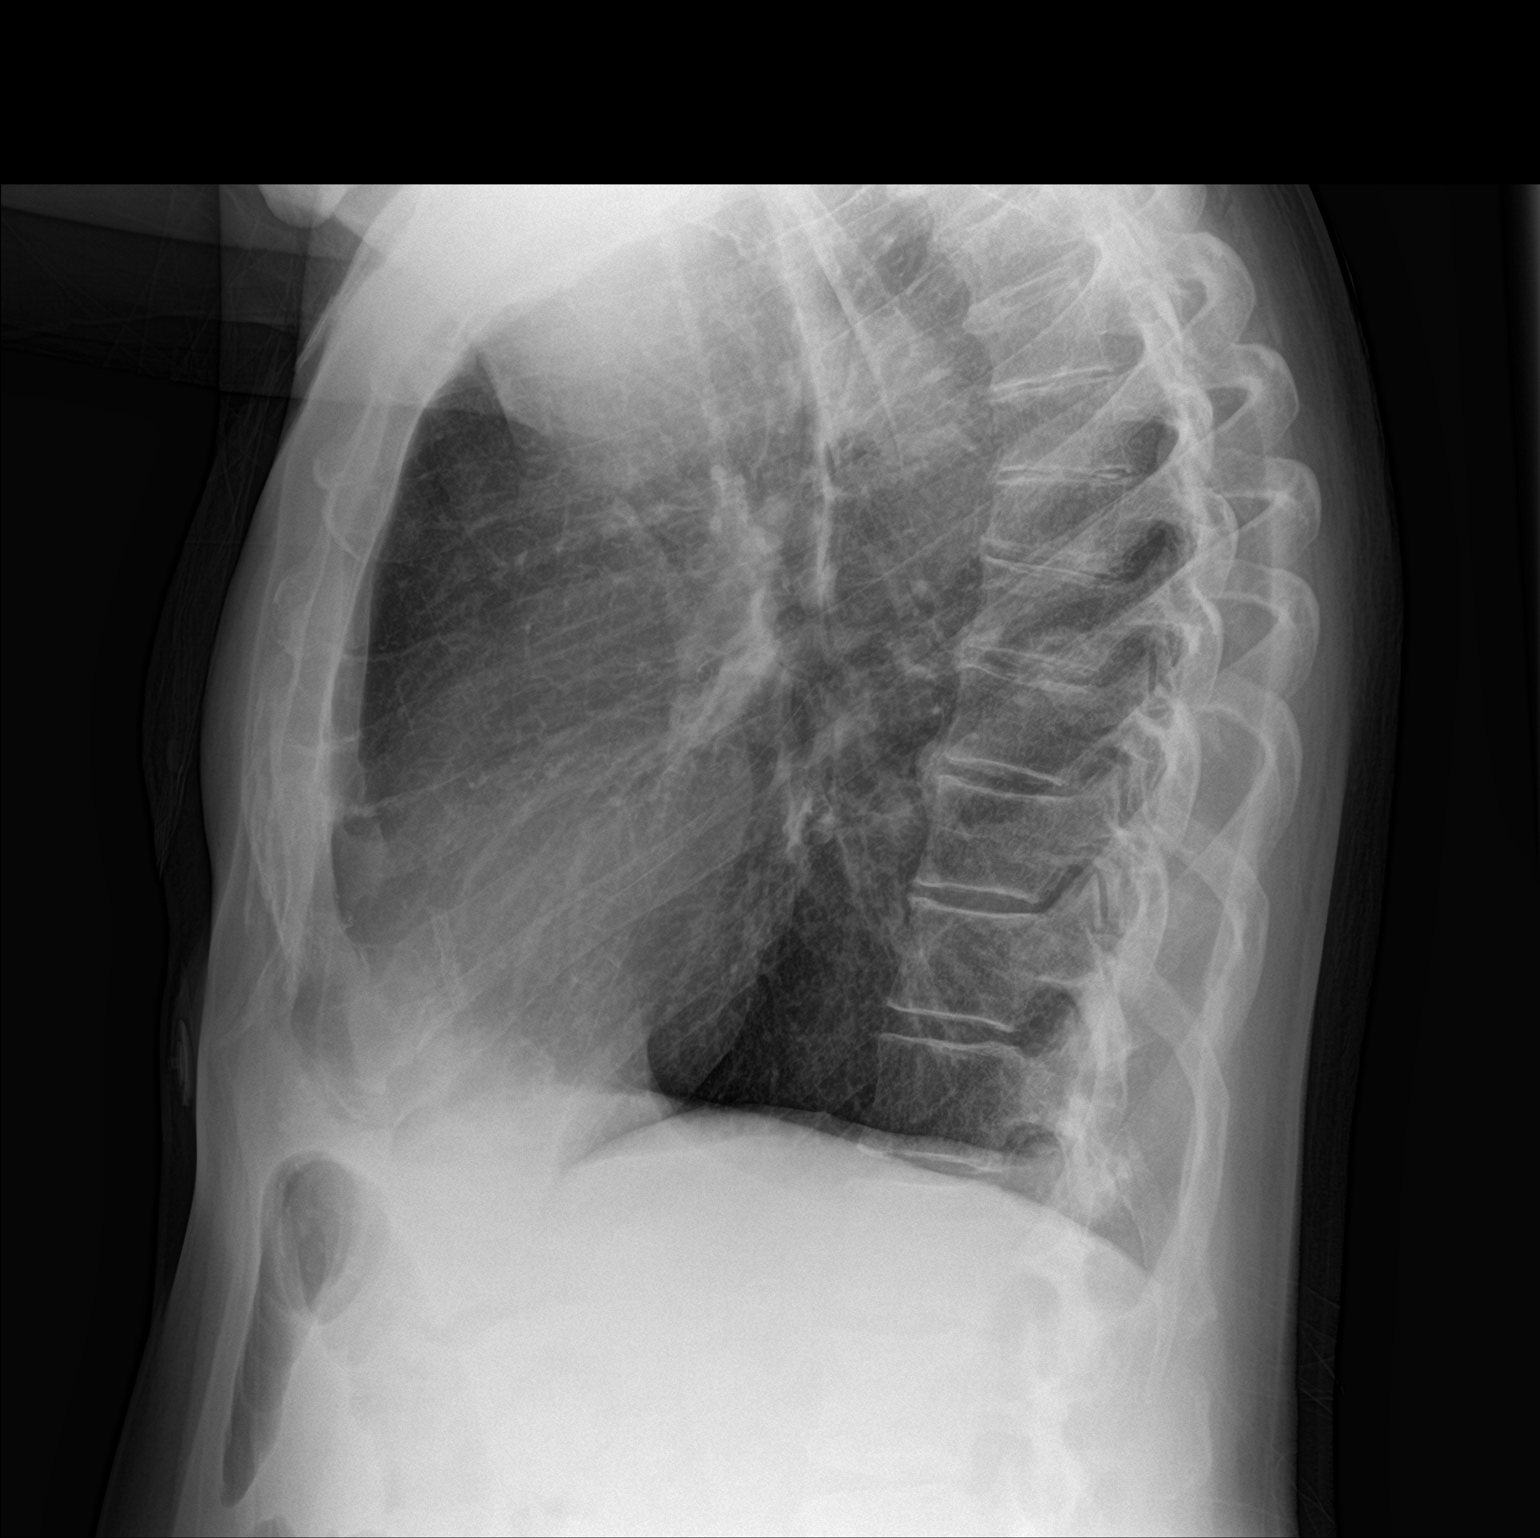

[2 of 2 positions shown; findings below may reference images not displayed]

FINDINGS: The heart size and mediastinal contours are within normal limits.
Both lungs are clear. No pneumothorax or pleural effusion is noted.
The visualized skeletal structures are unremarkable.
IMPRESSION: No active cardiopulmonary disease.

## 2018-03-13 ENCOUNTER — Emergency Department (HOSPITAL_COMMUNITY): Payer: Medicare Other

## 2018-03-13 ENCOUNTER — Emergency Department (HOSPITAL_COMMUNITY)
Admission: EM | Admit: 2018-03-13 | Discharge: 2018-03-13 | Disposition: A | Discharge status: Home / Self Care | Payer: Medicare Other | Attending: Emergency Medicine | Admitting: Emergency Medicine

## 2018-03-13 ENCOUNTER — Encounter (HOSPITAL_COMMUNITY): Payer: Self-pay | Admitting: Emergency Medicine

## 2018-03-13 ENCOUNTER — Other Ambulatory Visit: Payer: Self-pay

## 2018-03-13 DIAGNOSIS — F17210 Nicotine dependence, cigarettes, uncomplicated: Secondary | ICD-10-CM | POA: Insufficient documentation

## 2018-03-13 DIAGNOSIS — I2510 Atherosclerotic heart disease of native coronary artery without angina pectoris: Secondary | ICD-10-CM | POA: Diagnosis not present

## 2018-03-13 DIAGNOSIS — I5022 Chronic systolic (congestive) heart failure: Secondary | ICD-10-CM | POA: Diagnosis not present

## 2018-03-13 DIAGNOSIS — R600 Localized edema: Secondary | ICD-10-CM | POA: Insufficient documentation

## 2018-03-13 DIAGNOSIS — Z79899 Other long term (current) drug therapy: Secondary | ICD-10-CM | POA: Insufficient documentation

## 2018-03-13 DIAGNOSIS — Z7982 Long term (current) use of aspirin: Secondary | ICD-10-CM | POA: Diagnosis not present

## 2018-03-13 LAB — CBC
HCT: 46.6 % (ref 39.0–52.0)
Hemoglobin: 16.2 g/dL (ref 13.0–17.0)
MCH: 34.9 pg — ABNORMAL HIGH (ref 26.0–34.0)
MCHC: 34.8 g/dL (ref 30.0–36.0)
MCV: 100.4 fL — ABNORMAL HIGH (ref 78.0–100.0)
Platelets: 187 10*3/uL (ref 150–400)
RBC: 4.64 MIL/uL (ref 4.22–5.81)
RDW: 13.5 % (ref 11.5–15.5)
WBC: 8.1 10*3/uL (ref 4.0–10.5)

## 2018-03-13 LAB — BASIC METABOLIC PANEL
ANION GAP: 10 (ref 5–15)
BUN: 7 mg/dL (ref 6–20)
CALCIUM: 8.9 mg/dL (ref 8.9–10.3)
CO2: 24 mmol/L (ref 22–32)
Chloride: 104 mmol/L (ref 101–111)
Creatinine, Ser: 1.06 mg/dL (ref 0.61–1.24)
GLUCOSE: 102 mg/dL — AB (ref 65–99)
Potassium: 4.6 mmol/L (ref 3.5–5.1)
SODIUM: 138 mmol/L (ref 135–145)

## 2018-03-13 LAB — HEPATIC FUNCTION PANEL
ALT: 16 U/L — ABNORMAL LOW (ref 17–63)
AST: 21 U/L (ref 15–41)
Albumin: 4.1 g/dL (ref 3.5–5.0)
Alkaline Phosphatase: 78 U/L (ref 38–126)
Bilirubin, Direct: 0.2 mg/dL (ref 0.1–0.5)
Indirect Bilirubin: 0.5 mg/dL (ref 0.3–0.9)
Total Bilirubin: 0.7 mg/dL (ref 0.3–1.2)
Total Protein: 7.2 g/dL (ref 6.5–8.1)

## 2018-03-13 LAB — BRAIN NATRIURETIC PEPTIDE: B NATRIURETIC PEPTIDE 5: 86.4 pg/mL (ref 0.0–100.0)

## 2018-03-13 MED ORDER — ALBUTEROL SULFATE HFA 108 (90 BASE) MCG/ACT IN AERS
2.0000 | INHALATION_SPRAY | Freq: Once | RESPIRATORY_TRACT | Status: AC
  Administered 2018-03-13: 2 via RESPIRATORY_TRACT
  Filled 2018-03-13: qty 6.7

## 2018-03-13 MED ORDER — ATORVASTATIN CALCIUM 20 MG PO TABS: 20.0000 mg | ORAL_TABLET | Freq: Every day | ORAL | 1 refills | Status: DC

## 2018-03-13 MED ORDER — FUROSEMIDE 20 MG PO TABS: 20.0000 mg | ORAL_TABLET | Freq: Every day | ORAL | 0 refills | Status: DC

## 2018-03-13 MED ORDER — FUROSEMIDE 20 MG PO TABS
20.0000 mg | ORAL_TABLET | Freq: Once | ORAL | Status: AC
  Administered 2018-03-13: 20 mg via ORAL
  Filled 2018-03-13: qty 1

## 2018-03-13 NOTE — Progress Notes (Signed)
CSW received consult from pt's EDP for homelessness.   CSW called [facility]. [facility] does not have a bed. CSW called [facility], no answer, CSW left voicemail about bed availability. [place] shelters [place] surrounding areas and found no beds available. CSW called shelters in the [place] area, as well. Shelters in the [place] and [place] areas, no beds available.    Pt stopped receiving his social security and was then evicted from where he was staying due to not paying the rent. Pt will begin receiving his social security, again on 5/3.   Plan: CSW spoke with SW leadership to see what other resources are available.   Montine CircleKelsy Ghina Bittinger, Silverio LayLCSWA [facility]  [phone]

## 2018-03-13 NOTE — ED Notes (Addendum)
Pt states that he was recently evicted from his residence due to not getting his ss check and has been sleeping on the ground since and that is when the bilateral leg swelling started. Pt requesting sw consult to help him get into a shelter.

## 2018-03-13 NOTE — ED Notes (Signed)
SW at bedside.

## 2018-03-13 NOTE — Progress Notes (Addendum)
Update: CSW received phone call back from CSW leadership, pt does not qualify for housing program.   CSW spoke more with pt about homeless shelters in the surrounding area. Pt is familiar with [facility]. CSW left voicemail with shelter, again, to see if beds are available. [facility] does bed intakes daily from 2 to 8 pm. Pt is agreeable to go to [facility] to complete an intake.   CSW provided pt with [facility] bus tickets to get to shelter. Confirmed that [facility] buses run until 9:30 pm.   Montine CircleKelsy Aida Lemaire, Silverio LayLCSWA [facility]  [phone]

## 2018-03-13 NOTE — ED Notes (Signed)
SW with pt again, working on getting patient into a hotel room. Dr Criss AlvineGoldston with pt and this RN, pt verbalized understanding of d/c instructions and has no further questions. D/c to waiting room to wait for SW.

## 2018-03-13 NOTE — ED Provider Notes (Signed)
[facility] EMERGENCY DEPARTMENT Provider Note   CSN: [id number] Arrival date & time: 03/13/18  1251     History   Chief Complaint Chief Complaint  Patient presents with  . Leg Swelling    HPI Casey Jackson is a 67 y.o. male.  HPI  67 year old male presents with lower extremity edema and shortness of breath.  He states that he has been having bilateral lower examinee edema since he has been having to sleep flat because he got kicked out of his house.  He states his disability expired and he does not get it back until the beginning of May.  Since then he is noticed bilateral lower externally edema over the last 1 week or so.  He is been having a nonproductive cough.  Whenever he does any significant exertion or goes up stairs he will feel short of breath.  There is never any chest pain.  He has not been having fevers.  He states he has a history of coronary disease with prior heart attack and his chart lists prior ischemic cardiomyopathy.  He does not currently have a cardiologist.  He is currently homeless.  He currently smokes about 3 cigarettes/day.  Past Medical History:  Diagnosis Date  . Coronary artery disease    a. Big NSTEMI 11/2014: cath with surgical disease but patient initially refused CABG. Compromise decision was made to do intervention on the RCA with a bare metal stent with medical therapy for other disease and follow-up as an outpatient.  [name] HOH (hard of hearing)   . Hyperlipidemia   . Ischemic cardiomyopathy    a. a. Cath 12/22/14: EF 25%. b. 2D Echo 12/23/14: EF 25-30%, diffuse hypokinesis, akinesis of the entireinferolateral and inferior myocardium, mild MR.  . Myocardial infarction [facility])    NSTEMI  . Osteoarthritis   . Pneumonia   . Tobacco abuse   . Transaminitis     Patient Active Problem List   Diagnosis Date Noted  . Pain in the chest   . Palpitations   . ACS (acute coronary syndrome) (HCC) 05/01/2015  . Chest pain 05/01/2015  .  Chronic systolic CHF (congestive heart failure) (HCC) 04/27/2015  . Coronary artery disease   . Transaminitis   . Tobacco abuse   . HOH (hard of hearing)   . Osteoarthritis   . Hyperlipidemia LDL goal <70 12/24/2014  . Cardiomyopathy, ischemic 12/24/2014  . NSTEMI (non-ST elevated myocardial infarction) (HCC) 12/22/2014    Past Surgical History:  Procedure Laterality Date  . LEFT HEART CATHETERIZATION WITH CORONARY ANGIOGRAM N/A 12/22/2014   Procedure: LEFT HEART CATHETERIZATION WITH CORONARY ANGIOGRAM;  Surgeon: Corky Crafts, MD; LAD 100%, CFX 90%, RI mild dz, RCA 95%/70%, EF 25%, CABG recommended  . PERCUTANEOUS CORONARY STENT INTERVENTION (PCI-S) N/A 12/23/2014   Procedure: PERCUTANEOUS CORONARY STENT INTERVENTION (PCI-S);  Surgeon: Corky Crafts, MD; 3.0 x 24 rebel BMS to the RCA         Home Medications    Prior to Admission medications   Medication Sig Start Date End Date Taking? Authorizing Provider  acetaminophen (TYLENOL) 500 MG tablet Take 1 tablet (500 mg total) by mouth every 6 (six) hours as needed. 07/13/16   Cheri Fowler, PA-C  albuterol (PROVENTIL HFA;VENTOLIN HFA) 108 (90 Base) MCG/ACT inhaler Inhale 1-2 puffs into the lungs every 6 (six) hours as needed for wheezing. 04/18/17   Garlon Hatchet, PA-C  aspirin 81 MG tablet Take 81 mg by mouth daily.    [provider]  atorvastatin (LIPITOR) 20 MG tablet Take 1 tablet (20 mg total) by mouth daily. 03/13/18   Pricilla Loveless, MD  doxycycline (VIBRAMYCIN) 100 MG capsule Take 1 capsule (100 mg total) by mouth 2 (two) times daily. Patient not taking: Reported on 04/18/2017 03/21/17   Lavera Guise, MD  furosemide (LASIX) 20 MG tablet Take 1 tablet (20 mg total) by mouth daily. 03/13/18   Pricilla Loveless, MD  ibuprofen (ADVIL,MOTRIN) 400 MG tablet Take 1 tablet (400 mg total) by mouth every 6 (six) hours as needed. Patient not taking: Reported on 09/27/2016 07/13/16   Cheri Fowler, PA-C  levofloxacin (LEVAQUIN)  750 MG tablet Take 1 tablet (750 mg total) by mouth daily. 04/18/17   Garlon Hatchet, PA-C  nitroGLYCERIN (NITROSTAT) 0.4 MG SL tablet Place 1 tablet (0.4 mg total) under the tongue every 5 (five) minutes as needed for chest pain. 12/24/14   Barrett, Joline Salt, PA-C  prasugrel (EFFIENT) 10 MG TABS tablet Take 1 tablet (10 mg total) by mouth daily. Patient not taking: Reported on 09/27/2016 11/08/15   Azalee Course, PA  predniSONE (DELTASONE) 20 MG tablet Take 40 mg by mouth daily for 3 days, then 20mg  by mouth daily for 3 days, then 10mg  daily for 3 days 04/18/17   Garlon Hatchet, PA-C    Family History Family History  Problem Relation Age of Onset  . Cancer Father   . Hypertension Unknown   . Heart attack Neg Hx   . Stroke Neg Hx     Social History Social History   Tobacco Use  . Smoking status: Current Every Day Smoker    Packs/day: 0.00    Years: 50.00    Pack years: 0.00    Types: Cigarettes  . Smokeless tobacco: Current User  Substance Use Topics  . Alcohol use: No    Alcohol/week: 0.0 oz  . Drug use: No     Allergies   Penicillins and Ergocalciferol   Review of Systems Review of Systems  Constitutional: Negative for fever.  Respiratory: Positive for cough and shortness of breath.   Cardiovascular: Positive for leg swelling. Negative for chest pain.  Gastrointestinal: Negative for abdominal distention and abdominal pain.  All other systems reviewed and are negative.    Physical Exam Updated Vital Signs BP 134/80 (BP Location: Right Arm)   Pulse 71   Temp 98.7 F (37.1 C) (Oral)   Resp 16   SpO2 99%   Physical Exam  Constitutional: He is oriented to person, place, and time. He appears well-developed and well-nourished. No distress.  HENT:  Head: Normocephalic and atraumatic.  Right Ear: External ear normal.  Left Ear: External ear normal.  Nose: Nose normal.  Eyes: Right eye exhibits no discharge. Left eye exhibits no discharge.  Neck: Neck supple.    Cardiovascular: Normal rate, regular rhythm and normal heart sounds.  Pulmonary/Chest: Effort normal. No accessory muscle usage. No tachypnea. He has wheezes (mild, expiratory, diffuse).  Speaks in clear sentences  Abdominal: Soft. There is no tenderness.  Musculoskeletal: He exhibits edema (bilateral pitting edema, feet to mid-lower legs).  Neurological: He is alert and oriented to person, place, and time.  Skin: Skin is warm and dry. He is not diaphoretic.  Nursing note and vitals reviewed.    ED Treatments / Results  Labs (all labs ordered are listed, but only abnormal results are displayed) Labs Reviewed  BASIC METABOLIC PANEL - Abnormal; Notable for the following components:  Result Value   Glucose, Bld 102 (*)    All other components within normal limits  CBC - Abnormal; Notable for the following components:   MCV 100.4 (*)    MCH 34.9 (*)    All other components within normal limits  HEPATIC FUNCTION PANEL - Abnormal; Notable for the following components:   ALT 16 (*)    All other components within normal limits  BRAIN NATRIURETIC PEPTIDE  I-STAT TROPONIN, ED    EKG EKG Interpretation  Date/Time:  Wednesday March 13 2018 13:07:51 EDT Ventricular Rate:  84 PR Interval:  130 QRS Duration: 118 QT Interval:  414 QTC Calculation: 489 R Axis:   -44 Text Interpretation:  Normal sinus rhythm Left axis deviation Pulmonary disease pattern Incomplete right bundle branch block ST & T wave abnormality, consider inferolateral ischemia Prolonged QT Abnormal ECG no significant change compared to May 2018 Confirmed by Pricilla LovelessGoldston, Gagandeep Kossman [phone]([id number]) on 03/13/2018 4:09:19 PM   Radiology Dg Chest 2 View  Result Date: 03/13/2018 CLINICAL DATA:  67 y/o M; shortness of breath and bilateral ankle swelling for 2 weeks. EXAM: CHEST - 2 VIEW COMPARISON:  04/18/2017 FINDINGS: Normal cardiac silhouette. Clear lungs. No pleural effusion or pneumothorax. No acute osseous abnormality is evident.  Osteoarthrosis of the right acromioclavicular joint with osteophytosis. IMPRESSION: No acute pulmonary process identified. Electronically Signed   By: Mitzi HansenLance  Furusawa-Stratton M.D.   On: 03/13/2018 14:52    Procedures Procedures (including critical care time)  Medications Ordered in ED Medications  albuterol (PROVENTIL HFA;VENTOLIN HFA) 108 (90 Base) MCG/ACT inhaler 2 puff (2 puffs Inhalation Given 03/13/18 1718)  furosemide (LASIX) tablet 20 mg (20 mg Oral Given 03/13/18 1717)     Initial Impression / Assessment and Plan / ED Course  I have reviewed the triage vital signs and the nursing notes.  Pertinent labs & imaging results that were available during my care of the patient were reviewed by me and considered in my medical decision making (see chart for details).     Patient's i-STAT troponin was 0.02.  This is negative.  For some reason does not crossing over into the computer system.  However otherwise I highly doubt he is having angina or PE symptoms.  He could be having mild CHF but his chest x-ray shows no obvious edema.  However given his history of cardia myopathy and the leg swelling, I will provide him with a brief course of Lasix while he needs a follow-up with a cardiologist.  He does have some mild wheezing and given his smoking history, he will be given an albuterol inhaler to use now and at home.  Otherwise his lab work is unremarkable.  I discussed the importance of following up with a cardiologist as an outpatient.  He is temporarily homeless and social work and case management have been involved and will help him find a place to stay until he gets his disability back.  Discharge home with return precautions.  Final Clinical Impressions(s) / ED Diagnoses   Final diagnoses:  Bilateral lower extremity edema    ED Discharge Orders        Ordered    atorvastatin (LIPITOR) 20 MG tablet  Daily     03/13/18 1757    furosemide (LASIX) 20 MG tablet  Daily     03/13/18 1757        Pricilla LovelessGoldston, Dasia Guerrier, MD 03/13/18 1807

## 2018-03-13 NOTE — ED Triage Notes (Signed)
Pt arrives via POV from home with bilateral leg swelling for the last week. Pt reports some SOB. Denies recent fever. VSS.

## 2018-03-13 NOTE — Clinical Social Work Note (Signed)
Clinical Social Work Assessment  Patient Details  Name: Casey Jackson MRN: [id number] Date of Birth: [birth year]  Date of referral:  03/13/18               Reason for consult:  Housing Concerns/Homelessness                Permission sought to share information with:  Case Production designer, theatre/television/filmManager, Oceanographeracility Contact Representative Permission granted to share information::  Yes, Verbal Permission Granted  Name::        Agency::     Relationship::     Contact Information:     Housing/Transportation Living arrangements for the past 2 months:  Homeless Source of Information:  Patient Patient Interpreter Needed:  None Criminal Activity/Legal Involvement Pertinent to Current Situation/Hospitalization:    Significant Relationships:  None Lives with:  Self Do you feel safe going back to the place where you live?  No Need for family participation in patient care:  Yes (Comment)  Care giving concerns:  Concerns due to homelessness. Pt recently lost his social security check for unknown reasons, therefore got evicted from where he was staying. Pt is scheduled to start receiving his social security check on May 3.    Social Worker assessment / plan:  CSW received consult for pt due to homelessness with medical issues. Pt was recently evicted from where he was staying and has been sleeping on the streets. Pt informed staff that he will be receiving his social security check on May 3 and just needs assistance until then. Pt had been staying at a hotel, but ran out of money and ended up on the streets. Pt is ambulatory.  CSW called all shelters in [place] and surrounding counties. CSW called shelters in [place], [place], and [place] areas, but there were no beds available.   Employment status:  Retired Health and safety inspectornsurance information:  Medicare PT Recommendations:  Not assessed at this time Information / Referral to community resources:  Shelter  Patient/Family's Response to care:  Pt's agreeable to plan of care.    Patient/Family's Understanding of and Emotional Response to Diagnosis, Current Treatment, and Prognosis:  Pt did not have any questions or concerns for CSW.   Emotional Assessment Appearance:  Appears stated age Attitude/Demeanor/Rapport:    Affect (typically observed):  Accepting, Pleasant, Appropriate Orientation:  Oriented to Self, Oriented to  Time, Oriented to Place, Oriented to Situation Alcohol / Substance use:    Psych involvement (Current and /or in the community):  No (Comment)  Discharge Needs  Concerns to be addressed:  Homelessness Readmission within the last 30 days:  No Current discharge risk:  Lack of support system, Homeless Barriers to Discharge:  Homeless with medical needs   Montine CircleKelsy Catherina Pates, LCSW 03/13/2018, 6:03 PM

## 2018-03-13 NOTE — Care Management (Addendum)
ED CM and CSW met with patient in [place] bed D8 concerning consult for homeless and medication assistance.  Patient reports recently being evicted from home due to issues with SS check 2 months ago. As per patient he has been sleeping on the streets, patient presents to [facility] ED today with LLE swelling.  Hx CHF. Patient evaluated by the EDP plan to discharge home with po Lasix  atient states he does not have the funds CM will explore medication  assistance to help with co-pay.  CSW is assisting with finding homeless resources.  Updated Dr. Morton Amy and Lennette Bihari RN on [place] on working care transitional plan.

## 2018-03-14 LAB — I-STAT TROPONIN, ED: Troponin i, poc: 0.02 ng/mL (ref 0.00–0.08)

## 2018-04-04 ENCOUNTER — Other Ambulatory Visit: Payer: Self-pay

## 2018-04-04 ENCOUNTER — Emergency Department (HOSPITAL_COMMUNITY)
Admission: EM | Admit: 2018-04-04 | Discharge: 2018-04-04 | Disposition: A | Discharge status: Home / Self Care | Payer: Medicare Other | Attending: Emergency Medicine | Admitting: Emergency Medicine

## 2018-04-04 ENCOUNTER — Encounter (HOSPITAL_COMMUNITY): Payer: Self-pay | Admitting: Emergency Medicine

## 2018-04-04 ENCOUNTER — Emergency Department (HOSPITAL_COMMUNITY): Payer: Medicare Other

## 2018-04-04 DIAGNOSIS — Z955 Presence of coronary angioplasty implant and graft: Secondary | ICD-10-CM | POA: Insufficient documentation

## 2018-04-04 DIAGNOSIS — R079 Chest pain, unspecified: Secondary | ICD-10-CM | POA: Diagnosis present

## 2018-04-04 DIAGNOSIS — I2510 Atherosclerotic heart disease of native coronary artery without angina pectoris: Secondary | ICD-10-CM | POA: Insufficient documentation

## 2018-04-04 DIAGNOSIS — R0789 Other chest pain: Secondary | ICD-10-CM | POA: Diagnosis not present

## 2018-04-04 DIAGNOSIS — I252 Old myocardial infarction: Secondary | ICD-10-CM | POA: Diagnosis not present

## 2018-04-04 DIAGNOSIS — Z5900 Homelessness unspecified: Secondary | ICD-10-CM

## 2018-04-04 DIAGNOSIS — Z79899 Other long term (current) drug therapy: Secondary | ICD-10-CM | POA: Insufficient documentation

## 2018-04-04 DIAGNOSIS — Z7982 Long term (current) use of aspirin: Secondary | ICD-10-CM | POA: Diagnosis not present

## 2018-04-04 DIAGNOSIS — F17210 Nicotine dependence, cigarettes, uncomplicated: Secondary | ICD-10-CM | POA: Insufficient documentation

## 2018-04-04 DIAGNOSIS — I5022 Chronic systolic (congestive) heart failure: Secondary | ICD-10-CM | POA: Insufficient documentation

## 2018-04-04 LAB — BASIC METABOLIC PANEL
ANION GAP: 12 (ref 5–15)
BUN: 8 mg/dL (ref 6–20)
CALCIUM: 9.1 mg/dL (ref 8.9–10.3)
CHLORIDE: 105 mmol/L (ref 101–111)
CO2: 19 mmol/L — AB (ref 22–32)
CREATININE: 1.09 mg/dL (ref 0.61–1.24)
GFR calc Af Amer: >60 mL/min (ref >60)
GFR calc non Af Amer: >60 mL/min (ref >60)
GLUCOSE: 68 mg/dL (ref 65–99)
POTASSIUM: 5.6 mmol/L — AB (ref 3.5–5.1)
Sodium: 136 mmol/L (ref 135–145)

## 2018-04-04 LAB — CBC
HEMATOCRIT: 44.3 % (ref 39.0–52.0)
Hemoglobin: 15.1 g/dL (ref 13.0–17.0)
MCH: 34.2 pg — AB (ref 26.0–34.0)
MCHC: 34.1 g/dL (ref 30.0–36.0)
MCV: 100.5 fL — ABNORMAL HIGH (ref 78.0–100.0)
Platelets: 215 10*3/uL (ref 150–400)
RBC: 4.41 MIL/uL (ref 4.22–5.81)
RDW: 13.4 % (ref 11.5–15.5)
WBC: 8.5 10*3/uL (ref 4.0–10.5)

## 2018-04-04 LAB — I-STAT TROPONIN, ED: TROPONIN I, POC: 0.02 ng/mL (ref 0.00–0.08)

## 2018-04-04 NOTE — ED Triage Notes (Signed)
Patient complains of pain in his chest that started at 0600 feels "like a toothache in his chest". Patient reports that due to an issue in his disability he is currently homeless and requests assistance with finding shelter.

## 2018-04-04 NOTE — Progress Notes (Signed)
CSW consulted for homeless issues. CSW spoke with pt at bedside and was informed that pt had been getting a check every month up until two moths ago. Pt reports that pt hasn't received monthly check since January 27, 2018. Pt expressed that this is how pt was paying for living expenses. CSW spoke with pt about shelter resources as well as reached out to several to see if they had beds for the night. CSW confirmed that [facility] in [place] has a bed but pt is not interested in going there as pt states "I cant get out of there". Pt confirmed that pt wanted to go to [facility] in [place]. CSW reached out to [facility] multiple times and left VM's. CSW informed pt that this shelter wasn't answering, however pt still asking to go there. CSW has provided pt with bus ticket to assist with getting pt to [facility] at this time. There are no further CSW needs. CSW will sign off at this time. Please reconsult if new need arises.   Casey Jackson Casey Jackson, MSW, LCSW-A Emergency Department Clinical Social Worker [phone]

## 2018-04-04 NOTE — Discharge Instructions (Signed)
Nice to meet you Casey Jackson. Your labs and EKG show that your heart has not been injured as a cause for your pain. The social work has also help come up with a plan for potential shelters that would try to help get you back on your feet once you are able to receive income at the beginning of June. You also have the bus passes to help with transportation to get there this afternoon.

## 2018-04-04 NOTE — ED Provider Notes (Signed)
[facility] EMERGENCY DEPARTMENT Provider Note   CSN: [id number] Arrival date & time: 04/04/18  4098   History   Chief Complaint Chief Complaint  Patient presents with  . Chest Pain    HPI Casey Jackson is a 67 y.o. male past medical history of CAD status post bare-metal stent in 2016, ischemic cardiomyopathy, asthma, osteoarthritis who presented to the ED with complaints of chest pain.  He reports the pain as a dull ache that he is been experiencing over the last several days.  He denies associated diaphoresis, nausea, shortness of breath at those times.  He reports it is a different pain than his prior MI in 2016 which was a stabbing pain with associated diaphoresis.  He was recently evaluated for lower extremity edema which has resolved with initiation of Lasix.  He attributes his pain to increased feelings of anxiety because he is currently experiencing homelessness.  He states it is very difficult for him because of his age and medical problems to cope with this stressor and living outdoors.  He has been unable to establish at a shelter.  Past Medical History:  Diagnosis Date  . Coronary artery disease    a. Big NSTEMI 11/2014: cath with surgical disease but patient initially refused CABG. Compromise decision was made to do intervention on the RCA with a bare metal stent with medical therapy for other disease and follow-up as an outpatient.  [name] HOH (hard of hearing)   . Hyperlipidemia   . Ischemic cardiomyopathy    a. a. Cath 12/22/14: EF 25%. b. 2D Echo 12/23/14: EF 25-30%, diffuse hypokinesis, akinesis of the entireinferolateral and inferior myocardium, mild MR.  . Myocardial infarction [facility])    NSTEMI  . Osteoarthritis   . Pneumonia   . Tobacco abuse   . Transaminitis     Patient Active Problem List   Diagnosis Date Noted  . Pain in the chest   . Palpitations   . ACS (acute coronary syndrome) (HCC) 05/01/2015  . Chest pain 05/01/2015  . Chronic systolic  CHF (congestive heart failure) (HCC) 04/27/2015  . Coronary artery disease   . Transaminitis   . Tobacco abuse   . HOH (hard of hearing)   . Osteoarthritis   . Hyperlipidemia LDL goal <70 12/24/2014  . Cardiomyopathy, ischemic 12/24/2014  . NSTEMI (non-ST elevated myocardial infarction) (HCC) 12/22/2014    Past Surgical History:  Procedure Laterality Date  . LEFT HEART CATHETERIZATION WITH CORONARY ANGIOGRAM N/A 12/22/2014   Procedure: LEFT HEART CATHETERIZATION WITH CORONARY ANGIOGRAM;  Surgeon: Corky Crafts, MD; LAD 100%, CFX 90%, RI mild dz, RCA 95%/70%, EF 25%, CABG recommended  . PERCUTANEOUS CORONARY STENT INTERVENTION (PCI-S) N/A 12/23/2014   Procedure: PERCUTANEOUS CORONARY STENT INTERVENTION (PCI-S);  Surgeon: Corky Crafts, MD; 3.0 x 24 rebel BMS to the RCA         Home Medications    Prior to Admission medications   Medication Sig Start Date End Date Taking? Authorizing Provider  acetaminophen (TYLENOL) 500 MG tablet Take 1 tablet (500 mg total) by mouth every 6 (six) hours as needed. 07/13/16  Yes Cheri Fowler, PA-C  albuterol (PROVENTIL HFA;VENTOLIN HFA) 108 (90 Base) MCG/ACT inhaler Inhale 1-2 puffs into the lungs every 6 (six) hours as needed for wheezing. 04/18/17  Yes Garlon Hatchet, PA-C  aspirin 81 MG tablet Take 81 mg by mouth daily.   Yes [provider]  atorvastatin (LIPITOR) 20 MG tablet Take 1 tablet (20 mg total) by  mouth daily. 03/13/18  Yes Pricilla Loveless, MD  nitroGLYCERIN (NITROSTAT) 0.4 MG SL tablet Place 1 tablet (0.4 mg total) under the tongue every 5 (five) minutes as needed for chest pain. 12/24/14  Yes Barrett, Joline Salt, PA-C  furosemide (LASIX) 20 MG tablet Take 1 tablet (20 mg total) by mouth daily. Patient not taking: Reported on 04/04/2018 03/13/18   Pricilla Loveless, MD  prasugrel (EFFIENT) 10 MG TABS tablet Take 1 tablet (10 mg total) by mouth daily. Patient not taking: Reported on 09/27/2016 11/08/15   Azalee Course, PA    Family  History Family History  Problem Relation Age of Onset  . Cancer Father   . Hypertension Unknown   . Heart attack Neg Hx   . Stroke Neg Hx     Social History Social History   Tobacco Use  . Smoking status: Current Every Day Smoker    Packs/day: 0.00    Years: 50.00    Pack years: 0.00    Types: Cigarettes  . Smokeless tobacco: Current User  Substance Use Topics  . Alcohol use: No    Alcohol/week: 0.0 oz  . Drug use: No     Allergies   Penicillins and Ergocalciferol   Review of Systems Review of Systems  Constitutional: Negative for diaphoresis.  Respiratory: Negative for wheezing.   Cardiovascular: Positive for chest pain. Negative for leg swelling.  Gastrointestinal: Negative for nausea.  Psychiatric/Behavioral: The patient is nervous/anxious.      Physical Exam Updated Vital Signs BP 140/73   Pulse 71   Temp 99 F (37.2 C) (Oral)   Resp (!) 27   SpO2 97%   General: Elderly male resting on stretcher comfortably, no acute distress HEENT: Moist mucous membranes, corrective lenses CV: Regular rate and rhythm Resp: Coarse breath sounds bilaterally without wheezing, normal work of breathing, no distress  Abd: Soft, +BS, obese, nontender to palpation Extr: Minimal lower extremity edema, greatly improved compared to prior description Neuro: Alert and oriented x3, hard of hearing Skin: Warm, dry      ED Treatments / Results  Labs (all labs ordered are listed, but only abnormal results are displayed) Labs Reviewed  BASIC METABOLIC PANEL - Abnormal; Notable for the following components:      Result Value   Potassium 5.6 (*)    CO2 19 (*)    All other components within normal limits  CBC - Abnormal; Notable for the following components:   MCV 100.5 (*)    MCH 34.2 (*)    All other components within normal limits  I-STAT TROPONIN, ED    EKG EKG Interpretation  Date/Time:  Thursday Apr 04 2018 10:02:10 EDT Ventricular Rate:  93 PR  Interval:  120 QRS Duration: 116 QT Interval:  394 QTC Calculation: 489 R Axis:   -30 Text Interpretation:  Normal sinus rhythm Left axis deviation Incomplete right bundle branch block Septal infarct , age undetermined No significant change since last tracing Abnormal ekg Confirmed by Gerhard Munch [phone]) on 04/04/2018 12:57:28 PM   Radiology Dg Chest 2 View  Result Date: 04/04/2018 CLINICAL DATA:  Chest pain. EXAM: CHEST - 2 VIEW COMPARISON:  Radiographs of March 13, 2018. FINDINGS: The heart size and mediastinal contours are within normal limits. Both lungs are clear. No pneumothorax or pleural effusion is noted. The visualized skeletal structures are unremarkable. IMPRESSION: No active cardiopulmonary disease. Electronically Signed   By: Lupita Raider, M.D.   On: 04/04/2018 10:46    Procedures Procedures (including critical  care time)  Medications Ordered in ED Medications - No data to display   Initial Impression / Assessment and Plan / ED Course  I have reviewed the triage vital signs and the nursing notes.  Pertinent labs & imaging results that were available during my care of the patient were reviewed by me and considered in my medical decision making (see chart for details).  67 year old male with cardiac history and currently experiencing homelessness presenting with complaints of chest pain related to increased anxiety regarding his social situation.  His chest pain has improved since arrival to the ED, EKG is reassuring with similar appearance to prior no acute ischemic changes, initial troponin negative.  Patient being monitored on cardiac monitor, will reassess for any increased pain.  Patient does have risk factors and known coronary artery disease with previous recommendation for CABG, however ACS currently unlikely.  Will consult social work for potential resources and assistance regarding his social situation which seems to be his primary concern today.  Social worker  discussed several options for shelters and housing for the patient.  Unfortunately, the patient was not amenable to a.  Instead, the patient will try a [employer] shelter that confirmed bed availability shelter with alternate plan of a shelter in [place].  Social work was able to arrange for bus passes to aid in transportation.  He is otherwise stable for discharge, discussed plan with patient and he is agreeable.  Final Clinical Impressions(s) / ED Diagnoses   Final diagnoses:  Atypical chest pain  Homelessness    ED Discharge Orders    None       Ginger Carne, MD 04/04/18 1400    Gerhard Munch, MD 04/04/18 2310

## 2018-04-04 NOTE — ED Notes (Signed)
Called pt x2 for vitals, no response. °

## 2019-01-01 ENCOUNTER — Emergency Department (HOSPITAL_COMMUNITY): Payer: Medicare Other

## 2019-01-01 ENCOUNTER — Other Ambulatory Visit: Payer: Self-pay

## 2019-01-01 ENCOUNTER — Encounter (HOSPITAL_COMMUNITY): Payer: Self-pay | Admitting: *Deleted

## 2019-01-01 ENCOUNTER — Emergency Department (HOSPITAL_COMMUNITY)
Admission: EM | Admit: 2019-01-01 | Discharge: 2019-01-01 | Disposition: A | Discharge status: Home / Self Care | Payer: Medicare Other | Attending: Emergency Medicine | Admitting: Emergency Medicine

## 2019-01-01 DIAGNOSIS — I5022 Chronic systolic (congestive) heart failure: Secondary | ICD-10-CM | POA: Diagnosis not present

## 2019-01-01 DIAGNOSIS — Z79899 Other long term (current) drug therapy: Secondary | ICD-10-CM | POA: Insufficient documentation

## 2019-01-01 DIAGNOSIS — F17210 Nicotine dependence, cigarettes, uncomplicated: Secondary | ICD-10-CM | POA: Insufficient documentation

## 2019-01-01 DIAGNOSIS — M5431 Sciatica, right side: Secondary | ICD-10-CM | POA: Diagnosis not present

## 2019-01-01 DIAGNOSIS — M549 Dorsalgia, unspecified: Secondary | ICD-10-CM | POA: Diagnosis present

## 2019-01-01 LAB — CBC WITH DIFFERENTIAL/PLATELET
Abs Immature Granulocytes: 0.03 10*3/uL (ref 0.00–0.07)
Basophils Absolute: 0 10*3/uL (ref 0.0–0.1)
Basophils Relative: 1 %
EOS ABS: 0.1 10*3/uL (ref 0.0–0.5)
Eosinophils Relative: 1 %
HCT: 47.3 % (ref 39.0–52.0)
Hemoglobin: 16 g/dL (ref 13.0–17.0)
IMMATURE GRANULOCYTES: 0 %
Lymphocytes Relative: 16 %
Lymphs Abs: 1.4 10*3/uL (ref 0.7–4.0)
MCH: 34.1 pg — ABNORMAL HIGH (ref 26.0–34.0)
MCHC: 33.8 g/dL (ref 30.0–36.0)
MCV: 100.9 fL — AB (ref 80.0–100.0)
MONOS PCT: 6 %
Monocytes Absolute: 0.5 10*3/uL (ref 0.1–1.0)
NEUTROS PCT: 76 %
NRBC: 0 % (ref 0.0–0.2)
Neutro Abs: 6.7 10*3/uL (ref 1.7–7.7)
PLATELETS: 224 10*3/uL (ref 150–400)
RBC: 4.69 MIL/uL (ref 4.22–5.81)
RDW: 12.9 % (ref 11.5–15.5)
WBC: 8.8 10*3/uL (ref 4.0–10.5)

## 2019-01-01 LAB — BASIC METABOLIC PANEL
ANION GAP: 10 (ref 5–15)
BUN: 7 mg/dL — ABNORMAL LOW (ref 8–23)
CALCIUM: 9.1 mg/dL (ref 8.9–10.3)
CO2: 26 mmol/L (ref 22–32)
Chloride: 106 mmol/L (ref 98–111)
Creatinine, Ser: 1.2 mg/dL (ref 0.61–1.24)
GFR calc non Af Amer: >60 mL/min (ref >60)
GLUCOSE: 123 mg/dL — AB (ref 70–99)
Potassium: 4.2 mmol/L (ref 3.5–5.1)
Sodium: 142 mmol/L (ref 135–145)

## 2019-01-01 MED ORDER — MELOXICAM 7.5 MG PO TABS: 7.5000 mg | ORAL_TABLET | Freq: Two times a day (BID) | ORAL | 0 refills | Status: DC

## 2019-01-01 MED ORDER — IOPAMIDOL (ISOVUE-370) INJECTION 76%
INTRAVENOUS | Status: AC
  Filled 2019-01-01: qty 100

## 2019-01-01 MED ORDER — FENTANYL CITRATE (PF) 100 MCG/2ML IJ SOLN
50.0000 ug | Freq: Once | INTRAMUSCULAR | Status: AC
  Administered 2019-01-01: 50 ug via INTRAVENOUS
  Filled 2019-01-01: qty 2

## 2019-01-01 MED ORDER — IOPAMIDOL (ISOVUE-370) INJECTION 76%
100.0000 mL | Freq: Once | INTRAVENOUS | Status: AC | PRN
  Administered 2019-01-01: 100 mL via INTRAVENOUS

## 2019-01-01 MED ORDER — KETOROLAC TROMETHAMINE 30 MG/ML IJ SOLN
15.0000 mg | Freq: Once | INTRAMUSCULAR | Status: AC
  Administered 2019-01-01: 15 mg via INTRAVENOUS
  Filled 2019-01-01: qty 1

## 2019-01-01 MED ORDER — ONDANSETRON HCL 4 MG/2ML IJ SOLN
4.0000 mg | Freq: Once | INTRAMUSCULAR | Status: AC
Start: 2019-01-01 — End: 2019-01-01
  Administered 2019-01-01: 4 mg via INTRAVENOUS
  Filled 2019-01-01: qty 2

## 2019-01-01 MED ORDER — METHYLPREDNISOLONE 4 MG PO TBPK: ORAL_TABLET | ORAL | 0 refills | Status: DC

## 2019-01-01 MED ORDER — DEXAMETHASONE SODIUM PHOSPHATE 10 MG/ML IJ SOLN
10.0000 mg | Freq: Once | INTRAMUSCULAR | Status: AC
  Administered 2019-01-01: 10 mg via INTRAMUSCULAR
  Filled 2019-01-01: qty 1

## 2019-01-01 NOTE — ED Notes (Signed)
PA notified of pt's request for pain meds.

## 2019-01-01 NOTE — ED Triage Notes (Signed)
Pt states that for 2 days, every time he sits down, it feels as if he is getting stabbed in the R buttock.  Pain is relieved with standing and laying flat.

## 2019-01-01 NOTE — ED Notes (Signed)
Called radiology re results.  Staff stated radiologist is reading scan presently and results should be back shortly.

## 2019-01-01 NOTE — Discharge Instructions (Addendum)
Your pain is likely due to sciatica or inflammation of your sciatic nerve.  Take medications prescribed.  CT scan today did not show any blockage of your blood vessel.  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 Recommend followup by ultrasound in 3 years.  This can be discussed with your primary care provider who will help facilitate the follow up.    Contact your primary care provider if: You have pain that wakes you up when you are sleeping. You have pain that gets worse when you lie down. Your pain is worse than you have experienced in the past. Your pain lasts longer than 4 weeks. You experience unexplained weight loss. Get help in the ER if: You lose control of your bowel or bladder (incontinence). You have: Weakness in your lower back, pelvis, buttocks, or legs that gets worse. Redness or swelling of your back. A burning sensation when you urinate.

## 2019-01-01 NOTE — ED Notes (Signed)
Pulses found with doppler

## 2019-01-01 NOTE — ED Provider Notes (Signed)
[facility] EMERGENCY DEPARTMENT Provider Note   CSN: [id number] Arrival date & time: 01/01/19  1027     History   Chief Complaint Chief Complaint  Patient presents with  . Leg Pain    HPI Taydon Klass is a 68 y.o. male who presents with a cc of R back and leg pain. He has a PMH of CAD, PAD, chronic tobacco abuse. Onset 3 days ago. Rates pain as 10/10, sharp, stabbing, and radiates down the back of his R leg. Sxs are improved with lying flat or standing, worse with sitting. He denies injury. He has never had anything like this before. He did not take any medications PTA. For pain.   HPI  Past Medical History:  Diagnosis Date  . Coronary artery disease    a. Big NSTEMI 11/2014: cath with surgical disease but patient initially refused CABG. Compromise decision was made to do intervention on the RCA with a bare metal stent with medical therapy for other disease and follow-up as an outpatient.  [name] HOH (hard of hearing)   . Hyperlipidemia   . Ischemic cardiomyopathy    a. a. Cath 12/22/14: EF 25%. b. 2D Echo 12/23/14: EF 25-30%, diffuse hypokinesis, akinesis of the entireinferolateral and inferior myocardium, mild MR.  . Myocardial infarction [facility])    NSTEMI  . Osteoarthritis   . Pneumonia   . Tobacco abuse   . Transaminitis     Patient Active Problem List   Diagnosis Date Noted  . Pain in the chest   . Palpitations   . ACS (acute coronary syndrome) (HCC) 05/01/2015  . Chest pain 05/01/2015  . Chronic systolic CHF (congestive heart failure) (HCC) 04/27/2015  . Coronary artery disease   . Transaminitis   . Tobacco abuse   . HOH (hard of hearing)   . Osteoarthritis   . Hyperlipidemia LDL goal <70 12/24/2014  . Cardiomyopathy, ischemic 12/24/2014  . NSTEMI (non-ST elevated myocardial infarction) (HCC) 12/22/2014    Past Surgical History:  Procedure Laterality Date  . LEFT HEART CATHETERIZATION WITH CORONARY ANGIOGRAM N/A 12/22/2014   Procedure: LEFT HEART  CATHETERIZATION WITH CORONARY ANGIOGRAM;  Surgeon: Corky Crafts, MD; LAD 100%, CFX 90%, RI mild dz, RCA 95%/70%, EF 25%, CABG recommended  . PERCUTANEOUS CORONARY STENT INTERVENTION (PCI-S) N/A 12/23/2014   Procedure: PERCUTANEOUS CORONARY STENT INTERVENTION (PCI-S);  Surgeon: Corky Crafts, MD; 3.0 x 24 rebel BMS to the RCA         Home Medications    Prior to Admission medications   Medication Sig Start Date End Date Taking? Authorizing Provider  acetaminophen (TYLENOL) 500 MG tablet Take 1 tablet (500 mg total) by mouth every 6 (six) hours as needed. 07/13/16  Yes Cheri Fowler, PA-C  albuterol (PROVENTIL HFA;VENTOLIN HFA) 108 (90 Base) MCG/ACT inhaler Inhale 1-2 puffs into the lungs every 6 (six) hours as needed for wheezing. 04/18/17  Yes Garlon Hatchet, PA-C  aspirin 81 MG tablet Take 81 mg by mouth daily.   Yes [provider]  nitroGLYCERIN (NITROSTAT) 0.4 MG SL tablet Place 1 tablet (0.4 mg total) under the tongue every 5 (five) minutes as needed for chest pain. 12/24/14  Yes Barrett, Joline Salt, PA-C  atorvastatin (LIPITOR) 20 MG tablet Take 1 tablet (20 mg total) by mouth daily. Patient not taking: Reported on 01/01/2019 03/13/18   Pricilla Loveless, MD  furosemide (LASIX) 20 MG tablet Take 1 tablet (20 mg total) by mouth daily. Patient not taking: Reported on 04/04/2018 03/13/18  Pricilla Loveless, MD  prasugrel (EFFIENT) 10 MG TABS tablet Take 1 tablet (10 mg total) by mouth daily. Patient not taking: Reported on 09/27/2016 11/08/15   Azalee Course, PA    Family History Family History  Problem Relation Age of Onset  . Cancer Father   . Hypertension Other   . Heart attack Neg Hx   . Stroke Neg Hx     Social History Social History   Tobacco Use  . Smoking status: Current Every Day Smoker    Packs/day: 0.50    Years: 50.00    Pack years: 25.00    Types: Cigarettes  . Smokeless tobacco: Current User  Substance Use Topics  . Alcohol use: No    Alcohol/week: 0.0  standard drinks  . Drug use: No     Allergies   Penicillins; Simvastatin; and Ergocalciferol   Review of Systems Review of Systems Ten systems reviewed and are negative for acute change, except as noted in the HPI.    Physical Exam Updated Vital Signs BP 132/72   Pulse 83   Temp 98.2 F (36.8 C) (Oral)   Resp 16   Ht 5\' 11"  (1.803 m)   Wt 95.3 kg   SpO2 91%   BMI 29.29 kg/m   Physical Exam Vitals signs and nursing note reviewed.  Constitutional:      General: He is not in acute distress.    Appearance: He is well-developed. He is not diaphoretic.  HENT:     Head: Normocephalic and atraumatic.  Eyes:     General: No scleral icterus.    Conjunctiva/sclera: Conjunctivae normal.  Neck:     Musculoskeletal: Normal range of motion and neck supple.  Cardiovascular:     Rate and Rhythm: Normal rate and regular rhythm.     Heart sounds: Normal heart sounds.  Pulmonary:     Effort: Pulmonary effort is normal. No respiratory distress.     Breath sounds: Normal breath sounds.  Abdominal:     Palpations: Abdomen is soft.     Tenderness: There is no abdominal tenderness.  Skin:    General: Skin is warm and dry.  Neurological:     Mental Status: He is alert.  Psychiatric:        Behavior: Behavior normal.      ED Treatments / Results  Labs (all labs ordered are listed, but only abnormal results are displayed) Labs Reviewed  BASIC METABOLIC PANEL  CBC WITH DIFFERENTIAL/PLATELET    EKG None  Radiology No results found.  Procedures Procedures (including critical care time)  Medications Ordered in ED Medications  dexamethasone (DECADRON) injection 10 mg (has no administration in time range)     Initial Impression / Assessment and Plan / ED Course  I have reviewed the triage vital signs and the nursing notes.  Pertinent labs & imaging results that were available during my care of the patient were reviewed by me and considered in my medical decision  making (see chart for details).  Clinical Course as of Jan 01 1345  Wed Jan 01, 2019  1342 MCV(!): 100.9 [AH]    Clinical Course User Index [AH] Arthor Captain, PA-C    68 year old man who is a known vasculopath presents to the emergency department with sciatica pain.  He has no previous diagnosis of such.  Plain film of the lumbar spine does show disc space narrowing at L5-S1.  Patient's labs are without significant abnormality.  He does have pulses that are palpable with Doppler  in his DP and PT book bilaterally.  Patient seen and shared visit with Dr. Lawrence MarseillesGoldstone who wants to make sure he does not have aortic dissection mimicking sciatic pain.  I have given sign out to PA Laveda Normanran who will assume care of the patient.  His CT imaging is currently pending.  Final Clinical Impressions(s) / ED Diagnoses   Final diagnoses:  None    ED Discharge Orders    None       Arthor CaptainHarris, Mable Dara, PA-C 01/02/19 1432    Pricilla LovelessGoldston, [name], MD 01/04/19 [phone]

## 2019-03-04 ENCOUNTER — Ambulatory Visit: Payer: Medicare Other | Admitting: Internal Medicine

## 2019-04-03 ENCOUNTER — Ambulatory Visit: Payer: Medicare Other | Admitting: Internal Medicine

## 2019-04-10 ENCOUNTER — Ambulatory Visit: Payer: Medicare Other | Admitting: Internal Medicine

## 2019-04-28 ENCOUNTER — Ambulatory Visit: Payer: Medicare Other | Admitting: Internal Medicine

## 2019-09-05 ENCOUNTER — Emergency Department (HOSPITAL_COMMUNITY): Payer: Medicare Other

## 2019-09-05 ENCOUNTER — Observation Stay (HOSPITAL_COMMUNITY)
Admission: EM | Admit: 2019-09-05 | Discharge: 2019-09-06 | Disposition: A | Discharge status: Home / Self Care | Payer: Medicare Other | Attending: Family Medicine | Admitting: Family Medicine

## 2019-09-05 ENCOUNTER — Other Ambulatory Visit: Payer: Self-pay

## 2019-09-05 ENCOUNTER — Encounter (HOSPITAL_COMMUNITY): Payer: Self-pay

## 2019-09-05 DIAGNOSIS — Z20828 Contact with and (suspected) exposure to other viral communicable diseases: Secondary | ICD-10-CM | POA: Insufficient documentation

## 2019-09-05 DIAGNOSIS — Z79899 Other long term (current) drug therapy: Secondary | ICD-10-CM | POA: Insufficient documentation

## 2019-09-05 DIAGNOSIS — E785 Hyperlipidemia, unspecified: Secondary | ICD-10-CM | POA: Insufficient documentation

## 2019-09-05 DIAGNOSIS — J449 Chronic obstructive pulmonary disease, unspecified: Secondary | ICD-10-CM | POA: Diagnosis not present

## 2019-09-05 DIAGNOSIS — Z888 Allergy status to other drugs, medicaments and biological substances status: Secondary | ICD-10-CM | POA: Insufficient documentation

## 2019-09-05 DIAGNOSIS — Z716 Tobacco abuse counseling: Secondary | ICD-10-CM | POA: Insufficient documentation

## 2019-09-05 DIAGNOSIS — R0789 Other chest pain: Principal | ICD-10-CM | POA: Insufficient documentation

## 2019-09-05 DIAGNOSIS — Z9114 Patient's other noncompliance with medication regimen: Secondary | ICD-10-CM | POA: Diagnosis not present

## 2019-09-05 DIAGNOSIS — Z955 Presence of coronary angioplasty implant and graft: Secondary | ICD-10-CM | POA: Insufficient documentation

## 2019-09-05 DIAGNOSIS — R030 Elevated blood-pressure reading, without diagnosis of hypertension: Secondary | ICD-10-CM | POA: Diagnosis not present

## 2019-09-05 DIAGNOSIS — Z7982 Long term (current) use of aspirin: Secondary | ICD-10-CM | POA: Diagnosis not present

## 2019-09-05 DIAGNOSIS — R42 Dizziness and giddiness: Secondary | ICD-10-CM | POA: Diagnosis not present

## 2019-09-05 DIAGNOSIS — Z8249 Family history of ischemic heart disease and other diseases of the circulatory system: Secondary | ICD-10-CM | POA: Insufficient documentation

## 2019-09-05 DIAGNOSIS — Z880 Allergy status to penicillin: Secondary | ICD-10-CM | POA: Insufficient documentation

## 2019-09-05 DIAGNOSIS — R079 Chest pain, unspecified: Secondary | ICD-10-CM | POA: Diagnosis present

## 2019-09-05 DIAGNOSIS — F17210 Nicotine dependence, cigarettes, uncomplicated: Secondary | ICD-10-CM | POA: Insufficient documentation

## 2019-09-05 DIAGNOSIS — I2510 Atherosclerotic heart disease of native coronary artery without angina pectoris: Secondary | ICD-10-CM | POA: Insufficient documentation

## 2019-09-05 DIAGNOSIS — I5022 Chronic systolic (congestive) heart failure: Secondary | ICD-10-CM | POA: Diagnosis not present

## 2019-09-05 DIAGNOSIS — M1990 Unspecified osteoarthritis, unspecified site: Secondary | ICD-10-CM | POA: Insufficient documentation

## 2019-09-05 DIAGNOSIS — I255 Ischemic cardiomyopathy: Secondary | ICD-10-CM | POA: Diagnosis not present

## 2019-09-05 DIAGNOSIS — I252 Old myocardial infarction: Secondary | ICD-10-CM | POA: Diagnosis not present

## 2019-09-05 LAB — COMPREHENSIVE METABOLIC PANEL
ALT: 14 U/L (ref 0–44)
AST: 16 U/L (ref 15–41)
Albumin: 3.8 g/dL (ref 3.5–5.0)
Alkaline Phosphatase: 65 U/L (ref 38–126)
Anion gap: 8 (ref 5–15)
BUN: 11 mg/dL (ref 8–23)
CO2: 23 mmol/L (ref 22–32)
Calcium: 8.3 mg/dL — ABNORMAL LOW (ref 8.9–10.3)
Chloride: 107 mmol/L (ref 98–111)
Creatinine, Ser: 1 mg/dL (ref 0.61–1.24)
GFR calc Af Amer: >60 mL/min (ref >60)
GFR calc non Af Amer: >60 mL/min (ref >60)
Glucose, Bld: 113 mg/dL — ABNORMAL HIGH (ref 70–99)
Potassium: 3.5 mmol/L (ref 3.5–5.1)
Sodium: 138 mmol/L (ref 135–145)
Total Bilirubin: 0.7 mg/dL (ref 0.3–1.2)
Total Protein: 6.5 g/dL (ref 6.5–8.1)

## 2019-09-05 LAB — CBC
HCT: 42.5 % (ref 39.0–52.0)
Hemoglobin: 14.6 g/dL (ref 13.0–17.0)
MCH: 34.7 pg — ABNORMAL HIGH (ref 26.0–34.0)
MCHC: 34.4 g/dL (ref 30.0–36.0)
MCV: 101 fL — ABNORMAL HIGH (ref 80.0–100.0)
Platelets: 237 10*3/uL (ref 150–400)
RBC: 4.21 MIL/uL — ABNORMAL LOW (ref 4.22–5.81)
RDW: 12.7 % (ref 11.5–15.5)
WBC: 7.4 10*3/uL (ref 4.0–10.5)
nRBC: 0 % (ref 0.0–0.2)

## 2019-09-05 LAB — LIPASE, BLOOD: Lipase: 22 U/L (ref 11–51)

## 2019-09-05 LAB — TROPONIN I (HIGH SENSITIVITY)
Troponin I (High Sensitivity): 16 ng/L (ref <18)
Troponin I (High Sensitivity): 22 ng/L — ABNORMAL HIGH (ref <18)

## 2019-09-05 MED ORDER — ACETAMINOPHEN 325 MG PO TABS: 650.0000 mg | ORAL_TABLET | ORAL | Status: DC | PRN

## 2019-09-05 MED ORDER — SODIUM CHLORIDE 0.9% FLUSH
3.0000 mL | Freq: Once | INTRAVENOUS | Status: AC
  Administered 2019-09-05: 3 mL via INTRAVENOUS

## 2019-09-05 MED ORDER — ONDANSETRON HCL 4 MG/2ML IJ SOLN: 4.0000 mg | Freq: Four times a day (QID) | INTRAMUSCULAR | Status: DC | PRN

## 2019-09-05 MED ORDER — NITROGLYCERIN 2 % TD OINT
1.0000 [in_us] | TOPICAL_OINTMENT | TRANSDERMAL | Status: DC | PRN
  Filled 2019-09-05: qty 30

## 2019-09-05 MED ORDER — ASPIRIN EC 81 MG PO TBEC
81.0000 mg | DELAYED_RELEASE_TABLET | Freq: Every day | ORAL | Status: DC
  Administered 2019-09-06: 81 mg via ORAL
  Filled 2019-09-05: qty 1

## 2019-09-05 MED ORDER — ENOXAPARIN SODIUM 40 MG/0.4ML ~~LOC~~ SOLN
40.0000 mg | Freq: Every day | SUBCUTANEOUS | Status: DC
  Administered 2019-09-06: 40 mg via SUBCUTANEOUS
  Filled 2019-09-05: qty 0.4

## 2019-09-05 NOTE — H&P (Signed)
History and Physical    Casey Jackson JSE:[id number] DOB: May 28, 1951 DOA: 09/05/2019  PCP: Patient, No Pcp Per  Patient coming from: Home  I have personally briefly reviewed patient's old medical records in [facility] Link  Chief Complaint: Dizziness and chest pain  HPI: Casey Jackson is a 68 y.o. male with medical history significant of CAD/NSTEMI s/p RCA stent in 2016, ischemic cardiomyopathy, tobacco use who presents for concerns of dizziness and chest pain.  Patient reports that this morning he was eating hot dogs when he developed persistent dizziness and lightheadedness.  Then about a few hours after he began to develop nonradiating midsternal dull chest pain at rest that lasted for an hour.  Denies any shortness of breath.  Denies any nausea, vomiting or diaphoresis.  He denies it feeling like his previous MI.  States that it feels like "he ate something he was not supposed to."  Symptoms have all completely resolved at the time of evaluation.  He reports only taking an aspirin as he was intolerant of statins and had another medication that caused him to have hypotension.  ED Course: He was afebrile and normotensive on room air.  CBC showed no leukocytosis or anemia.  CMP showed glucose of 113 and low calcium of 8.3.  Troponin was 16 and then 22.  Chest x-ray showed no acute disease.  EKG showed sinus rhythm with left bundle branch block.  Review of Systems:  Constitutional: No Weight Change, No Fever ENT/Mouth: No sore throat, No Rhinorrhea Eyes: No Eye Pain, No Vision Changes Cardiovascular: No Chest Pain, no SOB Respiratory: No Cough, No Sputum, No Wheezing, no Dyspnea  Gastrointestinal: No Nausea, No Vomiting, No Diarrhea, No Constipation, No Pain Genitourinary: no Urinary Incontinence, No Urgency, No Flank Pain Musculoskeletal: No Arthralgias, No Myalgias Skin: No Skin Lesions, No Pruritus, Neuro: no Weakness, No Numbness,  No Loss of Consciousness, No Syncope Psych: No  Anxiety/Panic, No Depression, no decrease appetite Heme/Lymph: No Bruising, No Bleeding  Past Medical History:  Diagnosis Date   Coronary artery disease    a. Big NSTEMI 11/2014: cath with surgical disease but patient initially refused CABG. Compromise decision was made to do intervention on the RCA with a bare metal stent with medical therapy for other disease and follow-up as an outpatient.   HOH (hard of hearing)    Hyperlipidemia    Ischemic cardiomyopathy    a. a. Cath 12/22/14: EF 25%. b. 2D Echo 12/23/14: EF 25-30%, diffuse hypokinesis, akinesis of the entireinferolateral and inferior myocardium, mild MR.   Myocardial infarction [facility])    NSTEMI   Osteoarthritis    Pneumonia    Tobacco abuse    Transaminitis     Past Surgical History:  Procedure Laterality Date   LEFT HEART CATHETERIZATION WITH CORONARY ANGIOGRAM N/A 12/22/2014   Procedure: LEFT HEART CATHETERIZATION WITH CORONARY ANGIOGRAM;  Surgeon: Corky Crafts, MD; LAD 100%, CFX 90%, RI mild dz, RCA 95%/70%, EF 25%, CABG recommended   PERCUTANEOUS CORONARY STENT INTERVENTION (PCI-S) N/A 12/23/2014   Procedure: PERCUTANEOUS CORONARY STENT INTERVENTION (PCI-S);  Surgeon: Corky Crafts, MD; 3.0 x 24 rebel BMS to the RCA      reports that he has been smoking cigarettes. He has a 25.00 pack-year smoking history. He uses smokeless tobacco. He reports that he does not drink alcohol or use drugs.  Allergies  Allergen Reactions   Penicillins Swelling and Other (See Comments)    Has patient had a PCN reaction causing immediate rash, facial/tongue/throat swelling,  SOB or lightheadedness with hypotension: YES Has patient had a PCN reaction causing severe rash involving mucus membranes or skin necrosis: YES Has patient had a PCN reaction that required hospitalization NO, REACTION OCCURRED IN MD OFFICE Has patient had a PCN reaction occurring within the last 10 years: NO 1968 If all of the above answers are "NO",  then may proceed with Cephalosporin use.    Simvastatin Other (See Comments)    Muscle cramps   Ergocalciferol Palpitations    Family History  Problem Relation Age of Onset   Cancer Father    Hypertension Other    Heart attack Neg Hx    Stroke Neg Hx     Family history reviewed and not pertinent   Prior to Admission medications   Medication Sig Start Date End Date Taking? Authorizing Provider  aspirin 81 MG tablet Take 81 mg by mouth daily.   Yes [provider]  acetaminophen (TYLENOL) 500 MG tablet Take 1 tablet (500 mg total) by mouth every 6 (six) hours as needed. Patient not taking: Reported on 09/05/2019 07/13/16   [name], [name], PA-C  albuterol (PROVENTIL HFA;VENTOLIN HFA) 108 (90 Base) MCG/ACT inhaler Inhale 1-2 puffs into the lungs every 6 (six) hours as needed for wheezing. Patient not taking: Reported on 09/05/2019 04/18/17   Garlon HatchetSanders, [name], PA-C  atorvastatin (LIPITOR) 20 MG tablet Take 1 tablet (20 mg total) by mouth daily. Patient not taking: Reported on 01/01/2019 03/13/18   Pricilla LovelessGoldston, [name], MD  furosemide (LASIX) 20 MG tablet Take 1 tablet (20 mg total) by mouth daily. Patient not taking: Reported on 04/04/2018 03/13/18   Pricilla LovelessGoldston, [name], MD  meloxicam (MOBIC) 7.5 MG tablet Take 1 tablet (7.5 mg total) by mouth 2 (two) times daily. Patient not taking: Reported on 09/05/2019 01/01/19   Arthor CaptainHarris, [name], PA-C  methylPREDNISolone (MEDROL DOSEPAK) 4 MG TBPK tablet Use as directed Patient not taking: Reported on 09/05/2019 01/01/19   Arthor CaptainHarris, [name], PA-C  nitroGLYCERIN (NITROSTAT) 0.4 MG SL tablet Place 1 tablet (0.4 mg total) under the tongue every 5 (five) minutes as needed for chest pain. Patient not taking: Reported on 09/05/2019 12/24/14   Barrett, [name], PA-C  prasugrel (EFFIENT) 10 MG TABS tablet Take 1 tablet (10 mg total) by mouth daily. Patient not taking: Reported on 09/27/2016 11/08/15   Azalee CourseMeng, [name], [name]    Physical Exam: Vitals:   09/05/19 1529 09/05/19 1535  09/05/19 1916  BP: (!) 142/76  (!) 145/77  Pulse: 82  64  Resp: (!) 25  (!) 21  Temp: 98 F (36.7 C)    TempSrc: Oral    SpO2: 96%  97%  Weight:  90.7 kg   Height:  5\' 11"  (1.803 m)     Constitutional: NAD, calm, comfortable, obese male Sitting up on side of bed looking at cellphone Vitals:   09/05/19 1529 09/05/19 1535 09/05/19 1916  BP: (!) 142/76  (!) 145/77  Pulse: 82  64  Resp: (!) 25  (!) 21  Temp: 98 F (36.7 C)    TempSrc: Oral    SpO2: 96%  97%  Weight:  90.7 kg   Height:  5\' 11"  (1.803 m)    Eyes: PERRL, lids and conjunctivae normal ENMT: Mucous membranes are moist. Posterior pharynx clear of any exudate or lesions. Neck: normal, supple, no masses, no thyromegaly Respiratory: clear to auscultation bilaterally, no wheezing, no crackles. Normal respiratory effort. No accessory muscle use.  Cardiovascular: Regular rate and rhythm, no murmurs / rubs /  gallops. No extremity edema. 2+ pedal pulses.  Abdomen: no tenderness, no masses palpated. No hepatosplenomegaly. Bowel sounds positive.  Musculoskeletal: no clubbing / cyanosis. No joint deformity upper and lower extremities. Good ROM, no contractures. Normal muscle tone.  Skin: no rashes, lesions, ulcers. No induration Neurologic: CN 2-12 grossly intact. Sensation intact. Strength 5/5 in all 4.  Psychiatric: Normal judgment and insight. Alert and oriented x 3. Normal mood.     Labs on Admission: I have personally reviewed following labs and imaging studies  CBC: Recent Labs  Lab 09/05/19 1653  WBC 7.4  HGB 14.6  HCT 42.5  MCV 101.0*  PLT 237   Basic Metabolic Panel: Recent Labs  Lab 09/05/19 1653  NA 138  K 3.5  CL 107  CO2 23  GLUCOSE 113*  BUN 11  CREATININE 1.00  CALCIUM 8.3*   GFR: Estimated Creatinine Clearance: 82.6 mL/min (by C-G formula based on SCr of 1 mg/dL). Liver Function Tests: Recent Labs  Lab 09/05/19 1653  AST 16  ALT 14  ALKPHOS 65  BILITOT 0.7  PROT 6.5  ALBUMIN 3.8    Recent Labs  Lab 09/05/19 1653  LIPASE 22   No results for input(s): AMMONIA in the last 168 hours. Coagulation Profile: No results for input(s): INR, PROTIME in the last 168 hours. Cardiac Enzymes: No results for input(s): CKTOTAL, CKMB, CKMBINDEX, TROPONINI in the last 168 hours. BNP (last 3 results) No results for input(s): PROBNP in the last 8760 hours. HbA1C: No results for input(s): HGBA1C in the last 72 hours. CBG: No results for input(s): GLUCAP in the last 168 hours. Lipid Profile: No results for input(s): CHOL, HDL, LDLCALC, TRIG, CHOLHDL, LDLDIRECT in the last 72 hours. Thyroid Function Tests: No results for input(s): TSH, T4TOTAL, FREET4, T3FREE, THYROIDAB in the last 72 hours. Anemia Panel: No results for input(s): VITAMINB12, FOLATE, FERRITIN, TIBC, IRON, RETICCTPCT in the last 72 hours. Urine analysis:    Component Value Date/Time   COLORURINE YELLOW 01/03/2017 1802   APPEARANCEUR CLEAR 01/03/2017 1802   LABSPEC 1.023 01/03/2017 1802   PHURINE 6.0 01/03/2017 1802   GLUCOSEU NEGATIVE 01/03/2017 1802   HGBUR SMALL (A) 01/03/2017 1802   BILIRUBINUR NEGATIVE 01/03/2017 1802   KETONESUR NEGATIVE 01/03/2017 1802   PROTEINUR NEGATIVE 01/03/2017 1802   UROBILINOGEN 1.0 12/23/2014 0818   NITRITE NEGATIVE 01/03/2017 1802   LEUKOCYTESUR NEGATIVE 01/03/2017 1802    Radiological Exams on Admission: Dg Chest 2 View  Result Date: 09/05/2019 CLINICAL DATA:  Per EMS-states he was having some dizziness and haeache when he woke up this am-states symptoms has resolved-stent placed in 2016. Some chest pain about 2 hours prior. EXAM: CHEST - 2 VIEW COMPARISON:  Chest radiograph 04/04/2018 FINDINGS: Stable cardiomediastinal contours with normal heart size. Minimal opacity at the left base likely reflects atelectasis or scarring. No new opacity concerning for infection. No pneumothorax or pleural effusion. No acute finding in the visualized skeleton. IMPRESSION: No evidence of  active disease. Electronically Signed   By: Emmaline Kluver M.D.   On: 09/05/2019 17:01    EKG: Independently reviewed.   Assessment/Plan  Dizziness -Symptoms started while at rest while eating.  Resolved in the ED. - will obtain orthostatic vital signs - Keep on telemetry - check TSH   Chest pain -History of NSTEMI with RCA stent in 2016.  There was initial plans for a CABG at that time but patient declined due to social issues. -Initial troponin of 16 and then 22. - Discussed patient  with on-call cardiology Fellow Dr. Paticia Stack regarding whether to transfer patient to [facility] for further cardiology work-up. - He recommends keeping patient at [facility] long to continue troponin trend.  - If third troponin increases or patient develops chest pain, recommend starting heparin drip. -He will pass along information for cardiology to potentially evaluate patient in the morning.  If patient is in need of catheterization, the earliest they could proceed would be on Monday. -continue aspirin - unable to start statin due to history of intolerance. Will check Lipid panel.  - Pt not on beta-blocker or ACE and documented in the past by cardiology to have been "unable to tolerate with symptoms of chest pain, dizziness, and feeling drunk" - Nitro PRN  - consult cardiology for further recommendations in the morning    DVT prophylaxis:.Lovenox Code Status:Full  Family Communication: Plan discussed with patient at bedside  disposition Plan: Home with observation Consults called: Cardiology  Admission status: Observation   Kanin Lia T Nadine Ryle DO [facility]   If 7PM-7AM, please contact night-coverage [email or web address] Password [email or web address]  09/05/2019, 10:13 PM

## 2019-09-05 NOTE — ED Triage Notes (Signed)
Per EMS-states he was having some dizziness and haeache when he woke up this am-states symptoms has resolved-stent placed in 2016-CBG 252, does not have DM-350 cc of NS in route

## 2019-09-05 NOTE — ED Notes (Signed)
ED TO INPATIENT HANDOFF REPORT  ED Nurse Name and Phone #: Christinia Gully Name/Age/Gender Randon Goldsmith 68 y.o. male Room/Bed: WA19/WA19  Code Status   Code Status: Full Code  Home/SNF/Other Given to floor Patient oriented to: self, place, time and situation Is this baseline? Yes   Triage Complete: Triage complete  Chief Complaint Dizziness  Triage Note Per EMS-states he was having some dizziness and haeache when he woke up this am-states symptoms has resolved-stent placed in 2016-CBG 252, does not have DM-350 cc of NS in route  Patient states he had chest pain this AM x 2 hours, but none now.   Allergies Allergies  Allergen Reactions  . Penicillins Swelling and Other (See Comments)    Has patient had a PCN reaction causing immediate rash, facial/tongue/throat swelling, SOB or lightheadedness with hypotension: YES Has patient had a PCN reaction causing severe rash involving mucus membranes or skin necrosis: YES Has patient had a PCN reaction that required hospitalization NO, REACTION OCCURRED IN MD OFFICE Has patient had a PCN reaction occurring within the last 10 years: NO 1968 If all of the above answers are "NO", then may proceed with Cephalosporin use.   . Simvastatin Other (See Comments)    Muscle cramps  . Ergocalciferol Palpitations    Level of Care/Admitting Diagnosis ED Disposition    ED Disposition Condition [facility] Area: [place] [[id number]]  Level of Care: Telemetry [5]  Admit to tele based on following criteria: Monitor for Ischemic changes  Covid Evaluation: Asymptomatic Screening Protocol (No Symptoms)  Diagnosis: Chest pain [130865]  Admitting Physician: Orene Desanctis [[id number]]  Attending Physician: Orene Desanctis [[id number]]  PT Class (Do Not Modify): Observation [104]  PT Acc Code (Do Not Modify): Observation [10022]       B Medical/Surgery History Past Medical History:  Diagnosis Date  . Coronary artery  disease    a. Big NSTEMI 11/2014: cath with surgical disease but patient initially refused CABG. Compromise decision was made to do intervention on the RCA with a bare metal stent with medical therapy for other disease and follow-up as an outpatient.  [name] HOH (hard of hearing)   . Hyperlipidemia   . Ischemic cardiomyopathy    a. a. Cath 12/22/14: EF 25%. b. 2D Echo 12/23/14: EF 25-30%, diffuse hypokinesis, akinesis of the entireinferolateral and inferior myocardium, mild MR.  . Myocardial infarction [facility])    NSTEMI  . Osteoarthritis   . Pneumonia   . Tobacco abuse   . Transaminitis    Past Surgical History:  Procedure Laterality Date  . LEFT HEART CATHETERIZATION WITH CORONARY ANGIOGRAM N/A 12/22/2014   Procedure: LEFT HEART CATHETERIZATION WITH CORONARY ANGIOGRAM;  Surgeon: Jettie Booze, MD; LAD 100%, CFX 90%, RI mild dz, RCA 95%/70%, EF 25%, CABG recommended  . PERCUTANEOUS CORONARY STENT INTERVENTION (PCI-S) N/A 12/23/2014   Procedure: PERCUTANEOUS CORONARY STENT INTERVENTION (PCI-S);  Surgeon: Jettie Booze, MD; 3.0 x 24 rebel BMS to the RCA      A IV Location/Drains/Wounds Patient Lines/Drains/Airways Status   Active Line/Drains/Airways    Name:   Placement date:   Placement time:   Site:   Days:   Peripheral IV 09/05/19 Right Antecubital   09/05/19    1654    Antecubital   less than 1          Intake/Output Last 24 hours No intake or output data in the 24 hours ending 09/05/19 2333  Labs/Imaging Results  for orders placed or performed during the hospital encounter of 09/05/19 (from the past 48 hour(s))  CBC     Status: Abnormal   Collection Time: 09/05/19  4:53 PM  Result Value Ref Range   WBC 7.4 4.0 - 10.5 K/uL   RBC 4.21 (L) 4.22 - 5.81 MIL/uL   Hemoglobin 14.6 13.0 - 17.0 g/dL   HCT 16.142.5 09.639.0 - 04.552.0 %   MCV 101.0 (H) 80.0 - 100.0 fL   MCH 34.7 (H) 26.0 - 34.0 pg   MCHC 34.4 30.0 - 36.0 g/dL   RDW 40.912.7 81.111.5 - 91.415.5 %   Platelets 237 150 - 400 K/uL   nRBC  0.0 0.0 - 0.2 %    Comment: Performed at [facility], [address] [place], [place] [address]  Troponin I (High Sensitivity)     Status: None   Collection Time: 09/05/19  4:53 PM  Result Value Ref Range   Troponin I (High Sensitivity) 16 <18 ng/L    Comment: (NOTE) Elevated high sensitivity troponin I (hsTnI) values and significant  changes across serial measurements may suggest ACS but many other  chronic and acute conditions are known to elevate hsTnI results.  Refer to the "Links" section for chest pain algorithms and additional  guidance. Performed at [facility], [address] [place], [place] [address]   Comprehensive metabolic panel     Status: Abnormal   Collection Time: 09/05/19  4:53 PM  Result Value Ref Range   Sodium 138 135 - 145 mmol/L   Potassium 3.5 3.5 - 5.1 mmol/L   Chloride 107 98 - 111 mmol/L   CO2 23 22 - 32 mmol/L   Glucose, Bld 113 (H) 70 - 99 mg/dL   BUN 11 8 - 23 mg/dL   Creatinine, Ser 8.651.00 0.61 - 1.24 mg/dL   Calcium 8.3 (L) 8.9 - 10.3 mg/dL   Total Protein 6.5 6.5 - 8.1 g/dL   Albumin 3.8 3.5 - 5.0 g/dL   AST 16 15 - 41 U/L   ALT 14 0 - 44 U/L   Alkaline Phosphatase 65 38 - 126 U/L   Total Bilirubin 0.7 0.3 - 1.2 mg/dL   GFR calc non Af Amer >60 >60 mL/min   GFR calc Af Amer >60 >60 mL/min   Anion gap 8 5 - 15    Comment: Performed at [facility], [address] [place], [place] [address]  Lipase, blood     Status: None   Collection Time: 09/05/19  4:53 PM  Result Value Ref Range   Lipase 22 11 - 51 U/L    Comment: Performed at [facility], [address] [place], [place] [address]  Troponin I (High Sensitivity)     Status: Abnormal   Collection Time: 09/05/19  7:16 PM  Result Value Ref Range   Troponin I (High Sensitivity) 22 (H) <18 ng/L    Comment: (NOTE) Elevated high sensitivity troponin I (hsTnI) values and significant  changes across serial  measurements may suggest ACS but many other  chronic and acute conditions are known to elevate hsTnI results.  Refer to the "Links" section for chest pain algorithms and additional  guidance. Performed at [facility], [address] [place], [place] [address]    Dg Chest 2 View  Result Date: 09/05/2019 CLINICAL DATA:  Per EMS-states he was having some dizziness and haeache when he woke up this am-states symptoms has resolved-stent placed in  2016. Some chest pain about 2 hours prior. EXAM: CHEST - 2 VIEW COMPARISON:  Chest radiograph 04/04/2018 FINDINGS: Stable cardiomediastinal contours with normal heart size. Minimal opacity at the left base likely reflects atelectasis or scarring. No new opacity concerning for infection. No pneumothorax or pleural effusion. No acute finding in the visualized skeleton. IMPRESSION: No evidence of active disease. Electronically Signed   By: Emmaline Kluver M.D.   On: 09/05/2019 17:01    Pending Labs Unresulted Labs (From admission, onward)    Start     Ordered   09/06/19 0500  TSH  Tomorrow morning,   R     09/05/19 2249   09/05/19 2334  SARS CORONAVIRUS 2 (TAT 6-24 HRS) Nasopharyngeal Nasopharyngeal Swab  (Asymptomatic/Tier 2)  Once,   STAT    Question Answer Comment  Is this test for diagnosis or screening Screening   Symptomatic for COVID-19 as defined by CDC No   Hospitalized for COVID-19 No   Admitted to ICU for COVID-19 No   Previously tested for COVID-19 No   Resident in a congregate (group) care setting No   Employed in healthcare setting No      09/05/19 2333   09/05/19 2229  Lipid panel  Once,   STAT     09/05/19 2236   09/05/19 2227  HIV Antibody (routine testing w rflx)  (HIV Antibody (Routine testing w reflex) panel)  Once,   STAT     09/05/19 2236   09/05/19 2227  HIV4GL Save Tube  (HIV Antibody (Routine testing w reflex) panel)  Once,   STAT     09/05/19 2236          Vitals/Pain Today's Vitals   09/05/19  1529 09/05/19 1535 09/05/19 1916  BP: (!) 142/76  (!) 145/77  Pulse: 82  64  Resp: (!) 25  (!) 21  Temp: 98 F (36.7 C)    TempSrc: Oral    SpO2: 96%  97%  Weight:  90.7 kg   Height:  5\' 11"  (1.803 m)   PainSc:  0-No pain     Isolation Precautions No active isolations  Medications Medications  acetaminophen (TYLENOL) tablet 650 mg (has no administration in time range)  ondansetron (ZOFRAN) injection 4 mg (has no administration in time range)  enoxaparin (LOVENOX) injection 40 mg (has no administration in time range)  nitroGLYCERIN (NITROGLYN) 2 % ointment 1 inch (has no administration in time range)  aspirin EC tablet 81 mg (has no administration in time range)  sodium chloride flush (NS) 0.9 % injection 3 mL (3 mLs Intravenous Given 09/05/19 1918)    Mobility walks Low fall risk   Focused Assessments Cardiac Assessment Handoff:  Cardiac Rhythm: Normal sinus rhythm Lab Results  Component Value Date   TROPONINI 0.03 05/01/2015   Lab Results  Component Value Date   DDIMER 0.33 12/22/2014   Does the Patient currently have chest pain? No     R Recommendations: See Admitting Provider Note  Report given to:   Additional Notes:

## 2019-09-05 NOTE — ED Provider Notes (Signed)
[facility]-EMERGENCY DEPT Provider Note   CSN: [id number] Arrival date & time: 09/05/19  1517     History   Chief Complaint No chief complaint on file.   HPI Casey Jackson is a 68 y.o. male.     HPI  Patient with multiple medical issues presents after episode of dizziness, as well as another concern of chest pain. He notes that he was in his usual state of health today upon awakening.  1 about 8 hours prior to ED arrival the patient ate 2 hotdogs for breakfast. He notes that not long after he started feeling unsteady purulent minimal associated nausea, but no vomiting, no diarrhea, no fall. Patient developed chest pain about 3 hours later, which was tight, but this improved spontaneously, and is not currently present. No fever, no chills, no confusion, no disorientation. Patient's medical history includes CAD, prior MI.  On notably, patient stopped smoking cigarettes 3 days ago.  Past Medical History:  Diagnosis Date   Coronary artery disease    a. Big NSTEMI 11/2014: cath with surgical disease but patient initially refused CABG. Compromise decision was made to do intervention on the RCA with a bare metal stent with medical therapy for other disease and follow-up as an outpatient.   HOH (hard of hearing)    Hyperlipidemia    Ischemic cardiomyopathy    a. a. Cath 12/22/14: EF 25%. b. 2D Echo 12/23/14: EF 25-30%, diffuse hypokinesis, akinesis of the entireinferolateral and inferior myocardium, mild MR.   Myocardial infarction [facility](HCC)    NSTEMI   Osteoarthritis    Pneumonia    Tobacco abuse    Transaminitis     Patient Active Problem List   Diagnosis Date Noted   Pain in the chest    Palpitations    ACS (acute coronary syndrome) (HCC) 05/01/2015   Chest pain 05/01/2015   Chronic systolic CHF (congestive heart failure) (HCC) 04/27/2015   Coronary artery disease    Transaminitis    Tobacco abuse    HOH (hard of hearing)     Osteoarthritis    Hyperlipidemia LDL goal <70 12/24/2014   Cardiomyopathy, ischemic 12/24/2014   NSTEMI (non-ST elevated myocardial infarction) (HCC) 12/22/2014    Past Surgical History:  Procedure Laterality Date   LEFT HEART CATHETERIZATION WITH CORONARY ANGIOGRAM N/A 12/22/2014   Procedure: LEFT HEART CATHETERIZATION WITH CORONARY ANGIOGRAM;  Surgeon: Corky CraftsJayadeep S Varanasi, MD; LAD 100%, CFX 90%, RI mild dz, RCA 95%/70%, EF 25%, CABG recommended   PERCUTANEOUS CORONARY STENT INTERVENTION (PCI-S) N/A 12/23/2014   Procedure: PERCUTANEOUS CORONARY STENT INTERVENTION (PCI-S);  Surgeon: Corky CraftsJayadeep S Varanasi, MD; 3.0 x 24 rebel BMS to the RCA         Home Medications    Prior to Admission medications   Medication Sig Start Date End Date Taking? Authorizing Provider  aspirin 81 MG tablet Take 81 mg by mouth daily.   Yes [provider]  acetaminophen (TYLENOL) 500 MG tablet Take 1 tablet (500 mg total) by mouth every 6 (six) hours as needed. Patient not taking: Reported on 09/05/2019 07/13/16   [name], [name], PA-C  albuterol (PROVENTIL HFA;VENTOLIN HFA) 108 (90 Base) MCG/ACT inhaler Inhale 1-2 puffs into the lungs every 6 (six) hours as needed for wheezing. Patient not taking: Reported on 09/05/2019 04/18/17   Garlon HatchetSanders, [name], PA-C  atorvastatin (LIPITOR) 20 MG tablet Take 1 tablet (20 mg total) by mouth daily. Patient not taking: Reported on 01/01/2019 03/13/18   Pricilla LovelessGoldston, [name], MD  furosemide (LASIX)  20 MG tablet Take 1 tablet (20 mg total) by mouth daily. Patient not taking: Reported on 04/04/2018 03/13/18   Sherwood Gambler, MD  meloxicam (MOBIC) 7.5 MG tablet Take 1 tablet (7.5 mg total) by mouth 2 (two) times daily. Patient not taking: Reported on 09/05/2019 01/01/19   Margarita Mail, PA-C  methylPREDNISolone (MEDROL DOSEPAK) 4 MG TBPK tablet Use as directed Patient not taking: Reported on 09/05/2019 01/01/19   Margarita Mail, PA-C  nitroGLYCERIN (NITROSTAT) 0.4 MG SL tablet Place 1  tablet (0.4 mg total) under the tongue every 5 (five) minutes as needed for chest pain. Patient not taking: Reported on 09/05/2019 12/24/14   Barrett, Evelene Croon, PA-C  prasugrel (EFFIENT) 10 MG TABS tablet Take 1 tablet (10 mg total) by mouth daily. Patient not taking: Reported on 09/27/2016 11/08/15   Almyra Deforest, PA    Family History Family History  Problem Relation Age of Onset   Cancer Father    Hypertension Other    Heart attack Neg Hx    Stroke Neg Hx     Social History Social History   Tobacco Use   Smoking status: Current Every Day Smoker    Packs/day: 0.50    Years: 50.00    Pack years: 25.00    Types: Cigarettes   Smokeless tobacco: Current User  Substance Use Topics   Alcohol use: No    Alcohol/week: 0.0 standard drinks   Drug use: No     Allergies   Penicillins, Simvastatin, and Ergocalciferol   Review of Systems Review of Systems  Constitutional:       Per HPI, otherwise negative  HENT:       Per HPI, otherwise negative  Respiratory:       Per HPI, otherwise negative  Cardiovascular:       Per HPI, otherwise negative  Gastrointestinal: Negative for vomiting.  Endocrine:       Negative aside from HPI  Genitourinary:       Neg aside from HPI   Musculoskeletal:       Per HPI, otherwise negative  Skin: Negative.   Neurological: Positive for dizziness. Negative for syncope.     Physical Exam Updated Vital Signs BP (!) 142/76 (BP Location: Left Arm)    Pulse 82    Temp 98 F (36.7 C) (Oral)    Resp (!) 25    Ht 5\' 11"  (1.803 m)    Wt 90.7 kg    SpO2 96%    BMI 27.89 kg/m   Physical Exam Vitals signs and nursing note reviewed.  Constitutional:      General: He is not in acute distress.    Appearance: He is well-developed.  HENT:     Head: Normocephalic and atraumatic.  Eyes:     Conjunctiva/sclera: Conjunctivae normal.  Cardiovascular:     Rate and Rhythm: Normal rate and regular rhythm.  Pulmonary:     Effort: Pulmonary effort is  normal. Tachypnea present. No respiratory distress.     Breath sounds: No stridor.  Abdominal:     General: There is no distension.  Skin:    General: Skin is warm and dry.  Neurological:     Mental Status: He is alert and oriented to person, place, and time.      ED Treatments / Results  Labs (all labs ordered are listed, but only abnormal results are displayed) Labs Reviewed  CBC - Abnormal; Notable for the following components:      Result Value  RBC 4.21 (*)    MCV 101.0 (*)    MCH 34.7 (*)    All other components within normal limits  COMPREHENSIVE METABOLIC PANEL - Abnormal; Notable for the following components:   Glucose, Bld 113 (*)    Calcium 8.3 (*)    All other components within normal limits  TROPONIN I (HIGH SENSITIVITY) - Abnormal; Notable for the following components:   Troponin I (High Sensitivity) 22 (*)    All other components within normal limits  SARS CORONAVIRUS 2 (TAT 6-24 HRS)  LIPASE, BLOOD  HIV ANTIBODY (ROUTINE TESTING W REFLEX)  HIV4GL SAVE TUBE  LIPID PANEL  TSH  TROPONIN I (HIGH SENSITIVITY)  TROPONIN I (HIGH SENSITIVITY)  TROPONIN I (HIGH SENSITIVITY)    EKG EKG Interpretation  Date/Time:  Friday September 05 2019 15:29:05 EDT Ventricular Rate:  81 PR Interval:    QRS Duration: 134 QT Interval:  409 QTC Calculation: 475 R Axis:   -62 Text Interpretation:  Sinus rhythm Left axis deviation Incomplete Right Bundle Branch Block Baseline wander in lead(s) V2 Confirmed by Geoffery Lyons ([id number]) on 09/05/2019 3:44:38 PM   Radiology Dg Chest 2 View  Result Date: 09/05/2019 CLINICAL DATA:  Per EMS-states he was having some dizziness and haeache when he woke up this am-states symptoms has resolved-stent placed in 2016. Some chest pain about 2 hours prior. EXAM: CHEST - 2 VIEW COMPARISON:  Chest radiograph 04/04/2018 FINDINGS: Stable cardiomediastinal contours with normal heart size. Minimal opacity at the left base likely reflects atelectasis  or scarring. No new opacity concerning for infection. No pneumothorax or pleural effusion. No acute finding in the visualized skeleton. IMPRESSION: No evidence of active disease. Electronically Signed   By: Emmaline Kluver M.D.   On: 09/05/2019 17:01    Procedures Procedures (including critical care time)  Medications Ordered in ED Medications  acetaminophen (TYLENOL) tablet 650 mg (has no administration in time range)  ondansetron (ZOFRAN) injection 4 mg (has no administration in time range)  enoxaparin (LOVENOX) injection 40 mg (has no administration in time range)  nitroGLYCERIN (NITROGLYN) 2 % ointment 1 inch (has no administration in time range)  aspirin EC tablet 81 mg (has no administration in time range)  sodium chloride flush (NS) 0.9 % injection 3 mL (3 mLs Intravenous Given 09/05/19 1918)     Initial Impression / Assessment and Plan / ED Course  I have reviewed the triage vital signs and the nursing notes.  Pertinent labs & imaging results that were available during my care of the patient were reviewed by me and considered in my medical decision making (see chart for details).  On repeat exam patient is calm, still has no recurrence of his chest pain.  On however, patient's second troponin is elevated compared to his initial result with a delta value of 6. Given his cardiac history, elevated heart score, though initial findings are otherwise reassuring, he will be admitted for further monitoring, management.  Final Clinical Impressions(s) / ED Diagnoses   Final diagnoses:  Atypical chest pain  Dizzy     Gerhard Munch, MD 09/06/19 Rich Fuchs

## 2019-09-05 NOTE — ED Triage Notes (Signed)
Patient states he had chest pain this AM x 2 hours, but none now.

## 2019-09-06 ENCOUNTER — Encounter (HOSPITAL_COMMUNITY): Payer: Self-pay | Admitting: Cardiology

## 2019-09-06 DIAGNOSIS — R072 Precordial pain: Secondary | ICD-10-CM | POA: Diagnosis not present

## 2019-09-06 DIAGNOSIS — R0789 Other chest pain: Secondary | ICD-10-CM | POA: Diagnosis not present

## 2019-09-06 LAB — LIPID PANEL
Cholesterol: 216 mg/dL — ABNORMAL HIGH (ref 0–200)
HDL: 39 mg/dL — ABNORMAL LOW (ref >40)
LDL Cholesterol: 150 mg/dL — ABNORMAL HIGH (ref 0–99)
Total CHOL/HDL Ratio: 5.5 RATIO
Triglycerides: 135 mg/dL (ref <150)
VLDL: 27 mg/dL (ref 0–40)

## 2019-09-06 LAB — HIV ANTIBODY (ROUTINE TESTING W REFLEX): HIV Screen 4th Generation wRfx: NONREACTIVE

## 2019-09-06 LAB — TSH: TSH: 2.996 u[IU]/mL (ref 0.350–4.500)

## 2019-09-06 LAB — TROPONIN I (HIGH SENSITIVITY)
Troponin I (High Sensitivity): 18 ng/L — ABNORMAL HIGH (ref <18)
Troponin I (High Sensitivity): 19 ng/L — ABNORMAL HIGH (ref <18)
Troponin I (High Sensitivity): 18 ng/L — ABNORMAL HIGH (ref <18)
Troponin I (High Sensitivity): 17 ng/L (ref <18)

## 2019-09-06 LAB — SARS CORONAVIRUS 2 (TAT 6-24 HRS): SARS Coronavirus 2: NEGATIVE

## 2019-09-06 MED ORDER — ATORVASTATIN CALCIUM 20 MG PO TABS: 20.0000 mg | ORAL_TABLET | Freq: Every day | ORAL | 6 refills | Status: DC

## 2019-09-06 MED ORDER — ATORVASTATIN CALCIUM 20 MG PO TABS: 20.0000 mg | ORAL_TABLET | Freq: Every day | ORAL | Status: DC

## 2019-09-06 NOTE — Consult Note (Signed)
Cardiology Consultation:   Patient ID: Casey Jackson MRN: [id number]; DOB: Mar 16, 1951  Admit date: 09/05/2019 Date of Consult: 09/06/2019  Primary Care Provider: Patient, No Pcp Per Primary Cardiologist: Dr Martinique   Patient Profile:   Casey Jackson is a 68 y.o. male with a hx of CAD, ICM, hyperlipidemia who is being seen today for the evaluation of chest pain at the request of Ileene Musa DO.  History of Present Illness:   Patient underwent cardiac catheterization following non-ST elevation myocardial infarction in January 2016.  At that time he was found to have an occluded LAD, left to left collaterals, severe disease in the mid circumflex and in large OM 2, severe disease in the mid and distal right coronary artery and posterior lateral and ejection fraction 25%.  Coronary artery bypass and graft was recommended.  However patient declined and he had PCI of his right coronary artery.  He also refused ICD in the past.  Based on notes from Dr. Martinique multiple beta-blockers have been attempted previously including carvedilol, Toprol and Bystolic and he has also been tried on ACE inhibitors but has not tolerated due to chest pain, dizziness and "feeling drunk".  Patient typically denies dyspnea on exertion, orthopnea, PND, pedal edema, exertional chest pain, palpitations or syncope.  He states he ate 4 hotdogs yesterday.  He subsequently developed mild dizziness and chest pain in the substernal area described as a dull sensation without radiation and no associated symptoms.  Symptoms lasted 1 hour and resolve spontaneously.  He states he can have this after eating certain things.  He does not have chest pain with exertion.  He presented for evaluation and cardiology asked to evaluate.  Heart Pathway Score:     Past Medical History:  Diagnosis Date  . Coronary artery disease    a. Big NSTEMI 11/2014: cath with surgical disease but patient initially refused CABG. Compromise decision was made to do  intervention on the RCA with a bare metal stent with medical therapy for other disease and follow-up as an outpatient.  [name] HOH (hard of hearing)   . Hyperlipidemia   . Ischemic cardiomyopathy    a. a. Cath 12/22/14: EF 25%. b. 2D Echo 12/23/14: EF 25-30%, diffuse hypokinesis, akinesis of the entireinferolateral and inferior myocardium, mild MR.  . Myocardial infarction [facility])    NSTEMI  . Osteoarthritis   . Pneumonia   . Tobacco abuse   . Transaminitis     Past Surgical History:  Procedure Laterality Date  . LEFT HEART CATHETERIZATION WITH CORONARY ANGIOGRAM N/A 12/22/2014   Procedure: LEFT HEART CATHETERIZATION WITH CORONARY ANGIOGRAM;  Surgeon: Jettie Booze, MD; LAD 100%, CFX 90%, RI mild dz, RCA 95%/70%, EF 25%, CABG recommended  . PERCUTANEOUS CORONARY STENT INTERVENTION (PCI-S) N/A 12/23/2014   Procedure: PERCUTANEOUS CORONARY STENT INTERVENTION (PCI-S);  Surgeon: Jettie Booze, MD; 3.0 x 24 rebel BMS to the RCA       Inpatient Medications: Scheduled Meds: . aspirin EC  81 mg Oral Daily  . enoxaparin (LOVENOX) injection  40 mg Subcutaneous QHS   Continuous Infusions:  PRN Meds: acetaminophen, nitroGLYCERIN, ondansetron (ZOFRAN) IV  Allergies:    Allergies  Allergen Reactions  . Penicillins Swelling and Other (See Comments)    Has patient had a PCN reaction causing immediate rash, facial/tongue/throat swelling, SOB or lightheadedness with hypotension: YES Has patient had a PCN reaction causing severe rash involving mucus membranes or skin necrosis: YES Has patient had a PCN reaction that required hospitalization NO, REACTION  OCCURRED IN MD OFFICE Has patient had a PCN reaction occurring within the last 10 years: NO 1968 If all of the above answers are "NO", then may proceed with Cephalosporin use.   . Simvastatin Other (See Comments)    Muscle cramps  . Ergocalciferol Palpitations    Social History:   Social History   Socioeconomic History  . Marital  status: Single    Spouse name: Not on file  . Number of children: 0  . Years of education: Not on file  . Highest education level: Not on file  Occupational History  . Occupation: disabled    Comment: former Corporate investment banker  Social Needs  . Financial resource strain: Not on file  . Food insecurity    Worry: Not on file    Inability: Not on file  . Transportation needs    Medical: Not on file    Non-medical: Not on file  Tobacco Use  . Smoking status: Current Every Day Smoker    Packs/day: 0.50    Years: 50.00    Pack years: 25.00    Types: Cigarettes  . Smokeless tobacco: Current User  Substance and Sexual Activity  . Alcohol use: No    Alcohol/week: 0.0 standard drinks  . Drug use: No  . Sexual activity: Not on file  Lifestyle  . Physical activity    Days per week: Not on file    Minutes per session: Not on file  . Stress: Not on file  Relationships  . Social Musician on phone: Not on file    Gets together: Not on file    Attends religious service: Not on file    Active member of club or organization: Not on file    Attends meetings of clubs or organizations: Not on file    Relationship status: Not on file  . Intimate partner violence    Fear of current or ex partner: Not on file    Emotionally abused: Not on file    Physically abused: Not on file    Forced sexual activity: Not on file  Other Topics Concern  . Not on file  Social History Narrative  . Not on file    Family History:    Family History  Problem Relation Age of Onset  . Cancer Father   . Hypertension Other   . Heart attack Neg Hx   . Stroke Neg Hx      ROS:  Please see the history of present illness.  No fevers, chills or productive cough. All other ROS reviewed and negative.     Physical Exam/Data:   Vitals:   09/05/19 1916 09/05/19 2230 09/06/19 0012 09/06/19 0448  BP: (!) 145/77 (!) 141/85 (!) 155/88 113/69  Pulse: 64 63 72 65  Resp: (!) 21 (!) Temp:    98.2 F (36.8 C) 98.4 F (36.9 C)  TempSrc:   Oral Oral  SpO2: 97% 93% 99% 99%  Weight:      Height:       No intake or output data in the 24 hours ending 09/06/19 0740 Last 3 Weights 09/05/2019 01/01/2019 04/18/2017  Weight (lbs) 200 lb 210 lb 180 lb  Weight (kg) 90.719 kg 95.255 kg 81.647 kg     Body mass index is 27.89 kg/m.  General:  Well nourished, well developed, in no acute distress HEENT: normal Lymph: no adenopathy Neck: no JVD Endocrine:  No thryomegaly Vascular: No carotid bruits; FA pulses  2+ bilaterally without bruits  Cardiac:  normal S1, S2; RRR; no murmur; heart sounds distant Lungs:  Diminished BS throughout Abd: soft, nontender, no hepatomegaly  Ext: no edema Musculoskeletal:  No deformities, BUE and BLE strength normal and equal Skin: warm and dry  Neuro:  CNs 2-12 intact, no focal abnormalities noted Psych:  Normal affect   EKG:  The EKG was personally reviewed and demonstrates:  NSR, left anterior fascicular block, RV conduction delay, nonspecific ST changes. Telemetry:  Telemetry was personally reviewed and demonstrates:  Sinus    Laboratory Data:  High Sensitivity Troponin:   Recent Labs  Lab 09/05/19 1653 09/05/19 1916 09/06/19 0038 09/06/19 0252 09/06/19 0549  TROPONINIHS 16 22* 18* 19* 18*     Chemistry Recent Labs  Lab 09/05/19 1653  NA 138  K 3.5  CL 107  CO2 23  GLUCOSE 113*  BUN 11  CREATININE 1.00  CALCIUM 8.3*  GFRNONAA >60  GFRAA >60  ANIONGAP 8    Recent Labs  Lab 09/05/19 1653  PROT 6.5  ALBUMIN 3.8  AST 16  ALT 14  ALKPHOS 65  BILITOT 0.7   Hematology Recent Labs  Lab 09/05/19 1653  WBC 7.4  RBC 4.21*  HGB 14.6  HCT 42.5  MCV 101.0*  MCH 34.7*  MCHC 34.4  RDW 12.7  PLT 237    Radiology/Studies:  Dg Chest 2 View  Result Date: 09/05/2019 CLINICAL DATA:  Per EMS-states he was having some dizziness and haeache when he woke up this am-states symptoms has resolved-stent placed in 2016. Some chest  pain about 2 hours prior. EXAM: CHEST - 2 VIEW COMPARISON:  Chest radiograph 04/04/2018 FINDINGS: Stable cardiomediastinal contours with normal heart size. Minimal opacity at the left base likely reflects atelectasis or scarring. No new opacity concerning for infection. No pneumothorax or pleural effusion. No acute finding in the visualized skeleton. IMPRESSION: No evidence of active disease. Electronically Signed   By: Emmaline Kluver M.D.   On: 09/05/2019 17:01    Assessment and Plan:   1. Chest pain-symptoms are atypical and most consistent with GI etiology.  Troponins minimally elevated but no clear trend and not consistent with acute coronary syndrome.  Electrocardiogram shows no new ST changes.  I have discussed further cardiac work-up with patient as he has known severe coronary disease and ischemic cardiomyopathy.  He would not be agreeable to repeat cardiac catheterization, bypass surgery or defibrillator.  Would therefore recommend continuing medical therapy with no additional cardiac evaluation. 2. Coronary artery disease-plan medical therapy.  Continue aspirin but can discontinue prasugrel if pt taking.  Continue Lipitor.  I will not advance dose as he has had difficulties with this in the past and reviewing chart.  Discontinue Nitropaste. 3. Ischemic cardiomyopathy-patient is clear he does not want to consider defibrillator and understands the risk of sudden death.  He has not tolerated any beta-blocker in the past based on Dr. Elvis Coil note.  He also did not tolerate ACE inhibitor.  Will not add additional medications and he can follow-up with Dr. Sw[place] for further discussion. 4. Tobacco abuse-patient counseled on discontinuing. 5. Hyperlipidemia-he has had some intolerance to statins previously.  Would continue Lipitor 20 mg daily at discharge.  Patient can be discharged from a cardiac standpoint on present medications.  Follow-up with Dr. Sw[place]and 4 to 6 weeks.  Please call with  questions.  For questions or updates, please contact [facility] Please consult [email or web address] for contact info under     Signed,  Olga MillersBrian Yacoub Diltz, MD  09/06/2019 7:40 AM

## 2019-09-06 NOTE — TOC Transition Note (Signed)
Transition of Care [facility]) - CM/SW Discharge Note   Patient Details  Name: Casey Jackson MRN: [id number] Date of Birth: 1951-02-24  [facility] CM/SW Contact:  Dessa Phi, RN Phone Number: 09/06/2019, 1:26 PM   Clinical Narrative: Received call from nsg about d/c back to [facility]. TC Butch Penny Whitner [phone] will pick patient up provided her w/main tel# to call once @ [facility] main entrance-[phone]. Nurse updated. No further CM needs.            Patient Goals and CMS Choice        Discharge Placement                       Discharge Plan and Services                                     Social Determinants of Health (SDOH) Interventions     Readmission Risk Interventions No flowsheet data found.

## 2019-09-06 NOTE — Discharge Summary (Signed)
Physician Discharge Summary  Casey Jackson ZOX:[id number] DOB: 03/17/1951 DOA: 09/05/2019  PCP: Patient, No Pcp Per  Admit date: 09/05/2019 Discharge date: 09/06/2019  Time spent: 25 minutes  Recommendations for Outpatient Follow-up:  1. Recommended patient resume multiple medications on discharge however he was hesitant to do the same and only started back on statin  Discharge Diagnoses:  Active Problems:   Chest pain  Discharge Condition: Guarded  Diet recommendation: Diabetic heart healthy  Filed Weights   09/05/19 1535  Weight: 90.7 kg   68 WM CAD RCA stent 128 16+ ICM intolerant of beta-blocker/statin?  Tobacco COPD stage I-II HLD reported homelessness previously present [facility] nonradiating midsternal dull chest pain for 1 hour-did not feel like prior MI Admission work-up troponin XVI 1022 CXR negative EKG sinus rhythm LBP Cardiology fellow consulted Assessment & Plan: Chest pain?  CAD in patient with prior ICM CAD status post stent 'cardiology saw the patient in consult and recommended possible work-up of the same patient was not interested in same and patient was also not interested in multiple new medications as he still has bizarre beliefs about some of them causing headaches and dizzy drunk-like feelings despite no proven side effects consistent with that-he only wanted to take the statin he states that also he feels that high salt in the hot dogs caused him to have elevated blood pressure which caused him to have pain in the chest because of the high blood pressure-I did not argue or disagree with his somewhat bizarre believes but I have recommended that he follow-up if he has significant pain in his chest and to come back to the emergency room Tobacco abuse + COPD HLD  Consultations:  Cardiology Casey Jackson  Discharge Exam: Vitals:   09/06/19 0012 09/06/19 0448  BP: (!) 155/88 113/69  Pulse: 72 65  Resp: 18 18  Temp: 98.2 F (36.8 C) 98.4 F (36.9 C)  SpO2: 99%  99%    General: EOMI NCAT no focal deficit Cardiovascular: S1-S2 no murmur rub or gallop Respiratory: Clinically clear no added sound Abdomen soft no rebound no guarding  Discharge Instructions   Discharge Instructions    Diet - low sodium heart healthy   Complete by: As directed    Discharge instructions   Complete by: As directed    Take cholesterol meds and aspirin Please follow up at [facility] Report any further chest pain or come to emergency room if occurs   Increase activity slowly   Complete by: As directed      Allergies as of 09/06/2019      Reactions   Penicillins Swelling, Other (See Comments)   Has patient had a PCN reaction causing immediate rash, facial/tongue/throat swelling, SOB or lightheadedness with hypotension: YES Has patient had a PCN reaction causing severe rash involving mucus membranes or skin necrosis: YES Has patient had a PCN reaction that required hospitalization NO, REACTION OCCURRED IN MD OFFICE Has patient had a PCN reaction occurring within the last 10 years: NO 1968 If all of the above answers are "NO", then may proceed with Cephalosporin use.   Simvastatin Other (See Comments)   Muscle cramps   Ergocalciferol Palpitations      Medication List    STOP taking these medications   albuterol 108 (90 Base) MCG/ACT inhaler Commonly known as: VENTOLIN HFA   furosemide 20 MG tablet Commonly known as: LASIX   meloxicam 7.5 MG tablet Commonly known as: Mobic   methylPREDNISolone 4 MG Tbpk tablet  Commonly known as: MEDROL DOSEPAK   nitroGLYCERIN 0.4 MG SL tablet Commonly known as: Nitrostat   prasugrel 10 MG Tabs tablet Commonly known as: EFFIENT     TAKE these medications   acetaminophen 500 MG tablet Commonly known as: TYLENOL Take 1 tablet (500 mg total) by mouth every 6 (six) hours as needed.   aspirin 81 MG tablet Take 81 mg by mouth daily.   atorvastatin 20 MG tablet Commonly known as: LIPITOR Take 1  tablet (20 mg total) by mouth daily.      Allergies  Allergen Reactions  . Penicillins Swelling and Other (See Comments)    Has patient had a PCN reaction causing immediate rash, facial/tongue/throat swelling, SOB or lightheadedness with hypotension: YES Has patient had a PCN reaction causing severe rash involving mucus membranes or skin necrosis: YES Has patient had a PCN reaction that required hospitalization NO, REACTION OCCURRED IN MD OFFICE Has patient had a PCN reaction occurring within the last 10 years: NO 1968 If all of the above answers are "NO", then may proceed with Cephalosporin use.   . Simvastatin Other (See Comments)    Muscle cramps  . Ergocalciferol Palpitations   Follow-up Information    Casey Jackson, [place]. Go on 10/13/2019.   Specialties: Cardiology, Radiology Why: @1 :30pm for hospital follow up with Dr. Doug Sou PA Contact information: [address] [place] [address] [phone]            The results of significant diagnostics from this hospitalization (including imaging, microbiology, ancillary and laboratory) are listed below for reference.    Significant Diagnostic Studies: Dg Chest 2 View  Result Date: 09/05/2019 CLINICAL DATA:  Per EMS-states he was having some dizziness and haeache when he woke up this am-states symptoms has resolved-stent placed in 2016. Some chest pain about 2 hours prior. EXAM: CHEST - 2 VIEW COMPARISON:  Chest radiograph 04/04/2018 FINDINGS: Stable cardiomediastinal contours with normal heart size. Minimal opacity at the left base likely reflects atelectasis or scarring. No new opacity concerning for infection. No pneumothorax or pleural effusion. No acute finding in the visualized skeleton. IMPRESSION: No evidence of active disease. Electronically Signed   By: Casey Jackson M.D.   On: 09/05/2019 17:01    Microbiology: Recent Results (from the past 240 hour(s))  SARS CORONAVIRUS 2 (TAT 6-24 HRS)  Nasopharyngeal Nasopharyngeal Swab     Status: None   Collection Time: 09/05/19 11:34 PM   Specimen: Nasopharyngeal Swab  Result Value Ref Range Status   SARS Coronavirus 2 NEGATIVE NEGATIVE Final    Comment: (NOTE) SARS-CoV-2 target nucleic acids are NOT DETECTED. The SARS-CoV-2 RNA is generally detectable in upper and lower respiratory specimens during the acute phase of infection. Negative results do not preclude SARS-CoV-2 infection, do not rule out co-infections with other pathogens, and should not be used as the sole basis for treatment or other patient management decisions. Negative results must be combined with clinical observations, patient history, and epidemiological information. The expected result is Negative. Fact Sheet for Patients: SugarRoll.be Fact Sheet for Healthcare Providers: https://www.woods-mathews.com/ This test is not yet approved or cleared by the [place] FDA and  has been authorized for detection and/or diagnosis of SARS-CoV-2 by FDA under an Emergency Use Authorization (EUA). This EUA will remain  in effect (meaning this test can be used) for the duration of the COVID-19 declaration under Section 56 4(b)(1) of the Act, 21 U.S.C. section 360bbb-3(b)(1), unless the authorization is terminated or revoked sooner. Performed  at [facility], [address], [place], [place] [address]      Labs: Basic Metabolic Panel: Recent Labs  Lab 09/05/19 1653  NA 138  K 3.5  CL 107  CO2 23  GLUCOSE 113*  BUN 11  CREATININE 1.00  CALCIUM 8.3*   Liver Function Tests: Recent Labs  Lab 09/05/19 1653  AST 16  ALT 14  ALKPHOS 65  BILITOT 0.7  PROT 6.5  ALBUMIN 3.8   Recent Labs  Lab 09/05/19 1653  LIPASE 22   No results for input(s): AMMONIA in the last 168 hours. CBC: Recent Labs  Lab 09/05/19 1653  WBC 7.4  HGB 14.6  HCT 42.5  MCV 101.0*  PLT 237   Cardiac Enzymes: No results for  input(s): CKTOTAL, CKMB, CKMBINDEX, TROPONINI in the last 168 hours. BNP: BNP (last 3 results) No results for input(s): BNP in the last 8760 hours.  ProBNP (last 3 results) No results for input(s): PROBNP in the last 8760 hours.  CBG: No results for input(s): GLUCAP in the last 168 hours.     Signed:  Rhetta Mura MD   [facility] 09/06/2019, 1:01 PM

## 2019-10-13 ENCOUNTER — Ambulatory Visit: Payer: Medicare Other | Admitting: Cardiology

## 2019-10-13 ENCOUNTER — Telehealth: Payer: Self-pay

## 2019-10-13 NOTE — Telephone Encounter (Signed)
Left message for patient advising him to contact office to reschedule his follow up appt. Per Lurena Joiner he can be a virtual follow up.

## 2020-06-18 ENCOUNTER — Emergency Department (HOSPITAL_COMMUNITY): Payer: Medicare Other

## 2020-06-18 ENCOUNTER — Other Ambulatory Visit: Payer: Self-pay

## 2020-06-18 ENCOUNTER — Emergency Department (HOSPITAL_COMMUNITY)
Admission: EM | Admit: 2020-06-18 | Discharge: 2020-06-18 | Disposition: A | Discharge status: Home / Self Care | Payer: Medicare Other | Attending: Emergency Medicine | Admitting: Emergency Medicine

## 2020-06-18 ENCOUNTER — Telehealth: Payer: Self-pay | Admitting: Physician Assistant

## 2020-06-18 DIAGNOSIS — R509 Fever, unspecified: Secondary | ICD-10-CM | POA: Insufficient documentation

## 2020-06-18 DIAGNOSIS — U071 COVID-19: Secondary | ICD-10-CM | POA: Diagnosis not present

## 2020-06-18 DIAGNOSIS — F17210 Nicotine dependence, cigarettes, uncomplicated: Secondary | ICD-10-CM | POA: Diagnosis not present

## 2020-06-18 DIAGNOSIS — I2510 Atherosclerotic heart disease of native coronary artery without angina pectoris: Secondary | ICD-10-CM | POA: Insufficient documentation

## 2020-06-18 DIAGNOSIS — I5022 Chronic systolic (congestive) heart failure: Secondary | ICD-10-CM | POA: Diagnosis not present

## 2020-06-18 DIAGNOSIS — Z7982 Long term (current) use of aspirin: Secondary | ICD-10-CM | POA: Insufficient documentation

## 2020-06-18 DIAGNOSIS — Z809 Family history of malignant neoplasm, unspecified: Secondary | ICD-10-CM | POA: Diagnosis not present

## 2020-06-18 DIAGNOSIS — Z79899 Other long term (current) drug therapy: Secondary | ICD-10-CM | POA: Diagnosis not present

## 2020-06-18 DIAGNOSIS — I214 Non-ST elevation (NSTEMI) myocardial infarction: Secondary | ICD-10-CM | POA: Diagnosis not present

## 2020-06-18 DIAGNOSIS — R05 Cough: Secondary | ICD-10-CM | POA: Diagnosis present

## 2020-06-18 LAB — CBC
HCT: 43.8 % (ref 39.0–52.0)
Hemoglobin: 15.1 g/dL (ref 13.0–17.0)
MCH: 34.1 pg — ABNORMAL HIGH (ref 26.0–34.0)
MCHC: 34.5 g/dL (ref 30.0–36.0)
MCV: 98.9 fL (ref 80.0–100.0)
Platelets: 98 10*3/uL — ABNORMAL LOW (ref 150–400)
RBC: 4.43 MIL/uL (ref 4.22–5.81)
RDW: 13.2 % (ref 11.5–15.5)
WBC: 4.7 10*3/uL (ref 4.0–10.5)
nRBC: 0 % (ref 0.0–0.2)

## 2020-06-18 LAB — COMPREHENSIVE METABOLIC PANEL
ALT: 20 U/L (ref 0–44)
AST: 25 U/L (ref 15–41)
Albumin: 3.8 g/dL (ref 3.5–5.0)
Alkaline Phosphatase: 65 U/L (ref 38–126)
Anion gap: 8 (ref 5–15)
BUN: 7 mg/dL — ABNORMAL LOW (ref 8–23)
CO2: 26 mmol/L (ref 22–32)
Calcium: 8.5 mg/dL — ABNORMAL LOW (ref 8.9–10.3)
Chloride: 103 mmol/L (ref 98–111)
Creatinine, Ser: 0.97 mg/dL (ref 0.61–1.24)
GFR calc Af Amer: >60 mL/min (ref >60)
GFR calc non Af Amer: >60 mL/min (ref >60)
Glucose, Bld: 158 mg/dL — ABNORMAL HIGH (ref 70–99)
Potassium: 3.8 mmol/L (ref 3.5–5.1)
Sodium: 137 mmol/L (ref 135–145)
Total Bilirubin: 1 mg/dL (ref 0.3–1.2)
Total Protein: 7 g/dL (ref 6.5–8.1)

## 2020-06-18 LAB — SARS CORONAVIRUS 2 BY RT PCR (HOSPITAL ORDER, PERFORMED IN ~~LOC~~ HOSPITAL LAB): SARS Coronavirus 2: POSITIVE — AB

## 2020-06-18 LAB — LACTIC ACID, PLASMA: Lactic Acid, Venous: 1.5 mmol/L (ref 0.5–1.9)

## 2020-06-18 LAB — LIPASE, BLOOD: Lipase: 22 U/L (ref 11–51)

## 2020-06-18 MED ORDER — AEROCHAMBER PLUS FLO-VU LARGE MISC
Status: AC
  Administered 2020-06-18: 1
  Filled 2020-06-18: qty 1

## 2020-06-18 MED ORDER — AEROCHAMBER PLUS FLO-VU MEDIUM MISC
1.0000 | Freq: Once | Status: AC
  Filled 2020-06-18: qty 1

## 2020-06-18 MED ORDER — IPRATROPIUM BROMIDE HFA 17 MCG/ACT IN AERS
4.0000 | INHALATION_SPRAY | Freq: Once | RESPIRATORY_TRACT | Status: AC
  Administered 2020-06-18: 4 via RESPIRATORY_TRACT
  Filled 2020-06-18: qty 12.9

## 2020-06-18 MED ORDER — ALBUTEROL SULFATE HFA 108 (90 BASE) MCG/ACT IN AERS
8.0000 | INHALATION_SPRAY | RESPIRATORY_TRACT | Status: DC | PRN
  Administered 2020-06-18: 8 via RESPIRATORY_TRACT
  Filled 2020-06-18: qty 6.7

## 2020-06-18 MED ORDER — DOXYCYCLINE HYCLATE 100 MG PO CAPS
100.0000 mg | ORAL_CAPSULE | Freq: Two times a day (BID) | ORAL | 0 refills | Status: DC
Start: 2020-06-18 — End: 2021-06-13

## 2020-06-18 MED ORDER — SODIUM CHLORIDE 0.9% FLUSH
3.0000 mL | Freq: Once | INTRAVENOUS | Status: AC
  Administered 2020-06-18: 3 mL via INTRAVENOUS

## 2020-06-18 NOTE — ED Notes (Signed)
Patient verbalizes understanding of discharge instructions. Opportunity for questioning and answers were provided. Pt discharged from ED. 

## 2020-06-18 NOTE — ED Provider Notes (Signed)
[facility] EMERGENCY DEPARTMENT Provider Note   CSN: [id number] Arrival date & time: 06/18/20  0944     History Chief Complaint  Patient presents with  . Diarrhea  . Fever  . Cough    Casey Jackson is a 69 y.o. male.  Patient is a 69 year old male with a history of ischemic cardiomyopathy, CAD status post stents and ongoing tobacco abuse and prior pneumonia who is presenting today with a 6-day history of worsening cough, sputum production and fever.  Patient reports on Sunday he developed diarrhea for 2 days that resolved after Imodium but on Monday he started developing a cough that has gradually worsened over the last 5 days.  He reports at night when he goes to lay down the cough is much worse and he is coughing up thick sputum.  He is also been running a fever for the last 3 to 4 days.  He denies any shortness of breath, chest pain or abdominal pain.  Patient reports he has not been vaccinated against Covid because he is scared of the vaccine and possible side effects.  Patient does report that he wears a mask regularly and tries to stay away from crowds.  The history is provided by the patient.  Diarrhea Quality:  Watery Severity:  Moderate Onset quality:  Sudden Duration:  2 days Associated symptoms: fever   Fever Associated symptoms: cough and diarrhea   Cough Associated symptoms: fever        Past Medical History:  Diagnosis Date  . Coronary artery disease    a. Big NSTEMI 11/2014: cath with surgical disease but patient initially refused CABG. Compromise decision was made to do intervention on the RCA with a bare metal stent with medical therapy for other disease and follow-up as an outpatient.  [name] HOH (hard of hearing)   . Hyperlipidemia   . Ischemic cardiomyopathy    a. a. Cath 12/22/14: EF 25%. b. 2D Echo 12/23/14: EF 25-30%, diffuse hypokinesis, akinesis of the entireinferolateral and inferior myocardium, mild MR.  . Myocardial infarction [facility])      NSTEMI  . Osteoarthritis   . Pneumonia   . Tobacco abuse   . Transaminitis     Patient Active Problem List   Diagnosis Date Noted  . Dizziness   . History of non-ST elevation myocardial infarction (NSTEMI)   . Pain in the chest   . Palpitations   . ACS (acute coronary syndrome) (HCC) 05/01/2015  . Chest pain 05/01/2015  . Chronic systolic CHF (congestive heart failure) (HCC) 04/27/2015  . Coronary artery disease   . Transaminitis   . Tobacco abuse   . HOH (hard of hearing)   . Osteoarthritis   . Hyperlipidemia LDL goal <70 12/24/2014  . Cardiomyopathy, ischemic 12/24/2014  . NSTEMI (non-ST elevated myocardial infarction) (HCC) 12/22/2014    Past Surgical History:  Procedure Laterality Date  . LEFT HEART CATHETERIZATION WITH CORONARY ANGIOGRAM N/A 12/22/2014   Procedure: LEFT HEART CATHETERIZATION WITH CORONARY ANGIOGRAM;  Surgeon: Corky Crafts, MD; LAD 100%, CFX 90%, RI mild dz, RCA 95%/70%, EF 25%, CABG recommended  . PERCUTANEOUS CORONARY STENT INTERVENTION (PCI-S) N/A 12/23/2014   Procedure: PERCUTANEOUS CORONARY STENT INTERVENTION (PCI-S);  Surgeon: Corky Crafts, MD; 3.0 x 24 rebel BMS to the RCA        Family History  Problem Relation Age of Onset  . Cancer Father   . Hypertension Other   . Heart attack Neg Hx   . Stroke Neg Hx  Social History   Tobacco Use  . Smoking status: Current Every Day Smoker    Packs/day: 0.50    Years: 50.00    Pack years: 25.00    Types: Cigarettes  . Smokeless tobacco: Current User  Vaping Use  . Vaping Use: Never used  Substance Use Topics  . Alcohol use: No    Alcohol/week: 0.0 standard drinks  . Drug use: No    Home Medications Prior to Admission medications   Medication Sig Start Date End Date Taking? Authorizing Provider  acetaminophen (TYLENOL) 500 MG tablet Take 1 tablet (500 mg total) by mouth every 6 (six) hours as needed. Patient not taking: Reported on 09/05/2019 07/13/16   Cheri Fowler, PA-C   aspirin 81 MG tablet Take 81 mg by mouth daily.    [provider]  atorvastatin (LIPITOR) 20 MG tablet Take 1 tablet (20 mg total) by mouth daily. 09/06/19   Rhetta Mura, MD    Allergies    Penicillins, Simvastatin, and Ergocalciferol  Review of Systems   Review of Systems  Constitutional: Positive for fever.  Respiratory: Positive for cough.   Gastrointestinal: Positive for diarrhea.  All other systems reviewed and are negative.   Physical Exam Updated Vital Signs BP (!) 127/62 (BP Location: Left Arm)   Pulse 83   Temp 99.1 F (37.3 C) (Oral)   Resp 17   SpO2 97%   Physical Exam Vitals and nursing note reviewed.  Constitutional:      General: He is not in acute distress.    Appearance: Normal appearance. He is well-developed and normal weight.  HENT:     Head: Normocephalic and atraumatic.     Mouth/Throat:     Mouth: Mucous membranes are moist.  Eyes:     Conjunctiva/sclera: Conjunctivae normal.     Pupils: Pupils are equal, round, and reactive to light.  Cardiovascular:     Rate and Rhythm: Normal rate and regular rhythm.     Pulses: Normal pulses.     Heart sounds: No murmur heard.   Pulmonary:     Effort: Pulmonary effort is normal. No respiratory distress.     Breath sounds: Wheezing and rhonchi present. No rales.     Comments: Coarse rhonchi present in the left upper middle and lower lobe.  Minimal rhonchi in the right lower lobe Abdominal:     General: There is no distension.     Palpations: Abdomen is soft.     Tenderness: There is no abdominal tenderness. There is no guarding or rebound.  Musculoskeletal:        General: No tenderness. Normal range of motion.     Cervical back: Normal range of motion and neck supple.     Right lower leg: No edema.     Left lower leg: No edema.  Skin:    General: Skin is warm and dry.     Findings: No erythema or rash.  Neurological:     Mental Status: He is alert and oriented to person, place,  and time.  Psychiatric:        Behavior: Behavior normal.     ED Results / Procedures / Treatments   Labs (all labs ordered are listed, but only abnormal results are displayed) Labs Reviewed  SARS CORONAVIRUS 2 BY RT PCR (HOSPITAL ORDER, PERFORMED IN [facility]) - Abnormal; Notable for the following components:      Result Value   SARS Coronavirus 2 POSITIVE (*)    All  other components within normal limits  COMPREHENSIVE METABOLIC PANEL - Abnormal; Notable for the following components:   Glucose, Bld 158 (*)    BUN 7 (*)    Calcium 8.5 (*)    All other components within normal limits  CBC - Abnormal; Notable for the following components:   MCH 34.1 (*)    Platelets 98 (*)    All other components within normal limits  LIPASE, BLOOD  LACTIC ACID, PLASMA  URINALYSIS, ROUTINE W REFLEX MICROSCOPIC    EKG None  Radiology DG Chest Portable 1 View  Result Date: 06/18/2020 CLINICAL DATA:  Cough and shortness of breath. EXAM: PORTABLE CHEST 1 VIEW COMPARISON:  09/05/2019 FINDINGS: The cardiac silhouette, mediastinal and hilar contours are within normal limits. The lungs are clear. The bony thorax is intact. IMPRESSION: No acute cardiopulmonary findings. Electronically Signed   By: Rudie Meyer M.D.   On: 06/18/2020 10:44    Procedures Procedures (including critical care time)  Medications Ordered in ED Medications  sodium chloride flush (NS) 0.9 % injection 3 mL (has no administration in time range)    ED Course  I have reviewed the triage vital signs and the nursing notes.  Pertinent labs & imaging results that were available during my care of the patient were reviewed by me and considered in my medical decision making (see chart for details).    MDM Rules/Calculators/A&P                          Elderly gentleman with cardiac disease presenting with fever, cough that is worsened over the last 5 days.  Patient has not been vaccinated against Covid and  concern for Covid versus pneumonia.  Patient has no abdominal pain or concern for abdominal pathology at this time.  He has no distal edema or signs consistent with CHF.  Imaging and labs are pending.  Currently patient is hemodynamically stable and satting 97% on room air.  He denies any alcohol use in the last year but still smokes cigarettes daily.  Low suspicion for aspiration at this time.  Given wheezing as well will treat with albuterol and atrovent hFA.  12:26 PM Chest x-ray is clear without evidence of pneumonia at this time.  Labs with normal CBC except for new thrombocytopenia of platelet count of 98, normal lactic acid and unchanged CMP.  Patient's Covid testing is positive.  Feel that this explains patient's symptoms.  Patient is up moving around the room and oxygen saturation remains 96% on room air.  No indication for admission today.  Did contact the infusion center for referral and left patient's information.  Patient was given strict return precautions.  Final Clinical Impression(s) / ED Diagnoses Final diagnoses:  COVID-19    Rx / DC Orders ED Discharge Orders         Ordered    doxycycline (VIBRAMYCIN) 100 MG capsule  2 times daily     Discontinue  Reprint     06/18/20 1223           Gwyneth Sprout, MD 06/18/20 1229

## 2020-06-18 NOTE — ED Triage Notes (Signed)
Patient arrived via EMS; c/o cough, diarrhea and fever for a few days. Patient endorsed has had pneumonia before and it feels the same.

## 2020-06-18 NOTE — Telephone Encounter (Signed)
Received referral from Dr. Anitra Lauth.  Called to discuss with patient about Covid symptoms and the use of bamlanivimab/etesevimab or casirivimab/imdevimab, a monoclonal antibody infusion for those with mild to moderate Covid symptoms and at a high risk of hospitalization.  Pt is qualified for this infusion at the [facility] infusion center due to age >71, CVD   Phone went straight to VM. Message left to call back our hotline [phone].  Cline Crock PA-C  [facility]

## 2020-06-18 NOTE — Discharge Instructions (Signed)
There is no signs of pneumonia today on your x-ray and your blood work looks good except you did test positive for COVID.  You should quarantine at your home and wear a mask anytime you are outside of your home.  You can have 5-10 more days of symptoms.  I did contact the infusion center and left them your name and details and hopefully they should be contacting you for infusion.  You also received a prescription for an antibiotic in case there is some bacterial pneumonia as well.  The big things to watch for would be if you start having trouble breathing and cannot catch her breath you should return to the hospital.  You can use the inhaler you had today every 4-6 hours as needed for cough or wheezing.  You can also take Tylenol for your fever.

## 2020-06-19 ENCOUNTER — Telehealth: Payer: Self-pay | Admitting: Adult Health

## 2020-06-19 ENCOUNTER — Telehealth: Payer: Self-pay | Admitting: Nurse Practitioner

## 2020-06-19 NOTE — Telephone Encounter (Signed)
Called patient to review monoclonal antibody therapy for COVID 19.  I asked that he call us back as he meets criteria for infusion so that we could discuss further.    Call back number given: [phone].  [name], NP

## 2020-06-19 NOTE — Telephone Encounter (Signed)
Called to discuss with [name] about Covid symptoms and the use of casirivimab/imdevimab, a combination monoclonal antibody infusion for those with mild to moderate Covid symptoms and at a high risk of hospitalization.     Pt is qualified for this infusion at the [facility] infusion center due to co-morbid conditions (BMI >25, CAD). Per ED notation symptom onset 06/13/20. Last date he would be eligible for infusion is 06/22/20.   Unable to reach. Voicemail left.    Patient Active Problem List   Diagnosis Date Noted  . Dizziness   . History of non-ST elevation myocardial infarction (NSTEMI)   . Pain in the chest   . Palpitations   . ACS (acute coronary syndrome) (HCC) 05/01/2015  . Chest pain 05/01/2015  . Chronic systolic CHF (congestive heart failure) (HCC) 04/27/2015  . Coronary artery disease   . Transaminitis   . Tobacco abuse   . HOH (hard of hearing)   . Osteoarthritis   . Hyperlipidemia LDL goal <70 12/24/2014  . Cardiomyopathy, ischemic 12/24/2014  . NSTEMI (non-ST elevated myocardial infarction) (HCC) 12/22/2014    Willette Alma, AGPCNP-BC

## 2020-06-23 ENCOUNTER — Other Ambulatory Visit: Payer: Self-pay

## 2020-06-23 ENCOUNTER — Emergency Department (HOSPITAL_COMMUNITY): Payer: Medicare Other

## 2020-06-23 ENCOUNTER — Emergency Department (HOSPITAL_COMMUNITY)
Admission: EM | Admit: 2020-06-23 | Discharge: 2020-06-23 | Discharge status: Against Medical Advice | Payer: Medicare Other | Attending: Emergency Medicine | Admitting: Emergency Medicine

## 2020-06-23 DIAGNOSIS — Z5329 Procedure and treatment not carried out because of patient's decision for other reasons: Secondary | ICD-10-CM

## 2020-06-23 DIAGNOSIS — Z7982 Long term (current) use of aspirin: Secondary | ICD-10-CM | POA: Diagnosis not present

## 2020-06-23 DIAGNOSIS — R079 Chest pain, unspecified: Secondary | ICD-10-CM | POA: Diagnosis present

## 2020-06-23 DIAGNOSIS — R110 Nausea: Secondary | ICD-10-CM | POA: Diagnosis not present

## 2020-06-23 DIAGNOSIS — U071 COVID-19: Secondary | ICD-10-CM

## 2020-06-23 DIAGNOSIS — I2510 Atherosclerotic heart disease of native coronary artery without angina pectoris: Secondary | ICD-10-CM | POA: Diagnosis not present

## 2020-06-23 DIAGNOSIS — R5383 Other fatigue: Secondary | ICD-10-CM | POA: Insufficient documentation

## 2020-06-23 DIAGNOSIS — Z880 Allergy status to penicillin: Secondary | ICD-10-CM | POA: Diagnosis not present

## 2020-06-23 DIAGNOSIS — R509 Fever, unspecified: Secondary | ICD-10-CM | POA: Diagnosis not present

## 2020-06-23 DIAGNOSIS — F17210 Nicotine dependence, cigarettes, uncomplicated: Secondary | ICD-10-CM | POA: Diagnosis not present

## 2020-06-23 DIAGNOSIS — R05 Cough: Secondary | ICD-10-CM | POA: Insufficient documentation

## 2020-06-23 LAB — TROPONIN I (HIGH SENSITIVITY)
Troponin I (High Sensitivity): 19 ng/L — ABNORMAL HIGH (ref <18)
Troponin I (High Sensitivity): 22 ng/L — ABNORMAL HIGH (ref <18)

## 2020-06-23 LAB — BASIC METABOLIC PANEL
Anion gap: 11 (ref 5–15)
BUN: 9 mg/dL (ref 8–23)
CO2: 25 mmol/L (ref 22–32)
Calcium: 8.9 mg/dL (ref 8.9–10.3)
Chloride: 104 mmol/L (ref 98–111)
Creatinine, Ser: 1.04 mg/dL (ref 0.61–1.24)
GFR calc Af Amer: >60 mL/min (ref >60)
GFR calc non Af Amer: >60 mL/min (ref >60)
Glucose, Bld: 151 mg/dL — ABNORMAL HIGH (ref 70–99)
Potassium: 3.9 mmol/L (ref 3.5–5.1)
Sodium: 140 mmol/L (ref 135–145)

## 2020-06-23 LAB — CBC
HCT: 43 % (ref 39.0–52.0)
Hemoglobin: 14.8 g/dL (ref 13.0–17.0)
MCH: 34.5 pg — ABNORMAL HIGH (ref 26.0–34.0)
MCHC: 34.4 g/dL (ref 30.0–36.0)
MCV: 100.2 fL — ABNORMAL HIGH (ref 80.0–100.0)
Platelets: 203 10*3/uL (ref 150–400)
RBC: 4.29 MIL/uL (ref 4.22–5.81)
RDW: 13.4 % (ref 11.5–15.5)
WBC: 5.4 10*3/uL (ref 4.0–10.5)
nRBC: 0 % (ref 0.0–0.2)

## 2020-06-23 MED ORDER — LACTATED RINGERS IV SOLN: INTRAVENOUS | Status: DC

## 2020-06-23 MED ORDER — SODIUM CHLORIDE 0.9% FLUSH: 3.0000 mL | Freq: Once | INTRAVENOUS | Status: DC

## 2020-06-23 MED ORDER — ONDANSETRON HCL 4 MG/2ML IJ SOLN: 4.0000 mg | Freq: Once | INTRAMUSCULAR | Status: DC

## 2020-06-23 NOTE — ED Provider Notes (Signed)
[facility] EMERGENCY DEPARTMENT Provider Note   CSN: [id number] Arrival date & time: 06/23/20  1127     History Chief Complaint  Patient presents with  . Chest Pain    Casey Jackson is a 69 y.o. male with a past medical history of CAD, ischemic cardiomyopathy with an EF of 20 to 25% in 2016, NSTEMI, CHF, hyperlipidemia, ischemic cardiomyopathy, tobacco abuse, who presents today for evaluation of left-sided chest pain.  He started having symptoms of COVID-19 11 days ago.  He is not vaccinated and does not appear to have gotten the Mab infusions.  He reports that last night he started feeling nauseous, and today he has been nauseous without vomiting and having pain in the left side of his chest.  He attributes this to not having eaten due to the nausea.  He reports he has not had a fever for 2 days and only coughs when he lays down.  He denies any leg swelling.  He is not currently anticoagulated.  He denies hemoptysis.  He has not taken any nitro for his pain.  He denies any known aggravating or alleviating factors for his left-sided chest pain.  HPI     Past Medical History:  Diagnosis Date  . Coronary artery disease    a. Big NSTEMI 11/2014: cath with surgical disease but patient initially refused CABG. Compromise decision was made to do intervention on the RCA with a bare metal stent with medical therapy for other disease and follow-up as an outpatient.  [name] HOH (hard of hearing)   . Hyperlipidemia   . Ischemic cardiomyopathy    a. a. Cath 12/22/14: EF 25%. b. 2D Echo 12/23/14: EF 25-30%, diffuse hypokinesis, akinesis of the entireinferolateral and inferior myocardium, mild MR.  . Myocardial infarction [facility])    NSTEMI  . Osteoarthritis   . Pneumonia   . Tobacco abuse   . Transaminitis     Patient Active Problem List   Diagnosis Date Noted  . Dizziness   . History of non-ST elevation myocardial infarction (NSTEMI)   . Pain in the chest   . Palpitations   .  ACS (acute coronary syndrome) (HCC) 05/01/2015  . Chest pain 05/01/2015  . Chronic systolic CHF (congestive heart failure) (HCC) 04/27/2015  . Coronary artery disease   . Transaminitis   . Tobacco abuse   . HOH (hard of hearing)   . Osteoarthritis   . Hyperlipidemia LDL goal <70 12/24/2014  . Cardiomyopathy, ischemic 12/24/2014  . NSTEMI (non-ST elevated myocardial infarction) (HCC) 12/22/2014    Past Surgical History:  Procedure Laterality Date  . LEFT HEART CATHETERIZATION WITH CORONARY ANGIOGRAM N/A 12/22/2014   Procedure: LEFT HEART CATHETERIZATION WITH CORONARY ANGIOGRAM;  Surgeon: Corky Crafts, MD; LAD 100%, CFX 90%, RI mild dz, RCA 95%/70%, EF 25%, CABG recommended  . PERCUTANEOUS CORONARY STENT INTERVENTION (PCI-S) N/A 12/23/2014   Procedure: PERCUTANEOUS CORONARY STENT INTERVENTION (PCI-S);  Surgeon: Corky Crafts, MD; 3.0 x 24 rebel BMS to the RCA        Family History  Problem Relation Age of Onset  . Cancer Father   . Hypertension Other   . Heart attack Neg Hx   . Stroke Neg Hx     Social History   Tobacco Use  . Smoking status: Current Every Day Smoker    Packs/day: 0.50    Years: 50.00    Pack years: 25.00    Types: Cigarettes  . Smokeless tobacco: Current User  Vaping Use  .  Vaping Use: Never used  Substance Use Topics  . Alcohol use: No    Alcohol/week: 0.0 standard drinks  . Drug use: No    Home Medications Prior to Admission medications   Medication Sig Start Date End Date Taking? Authorizing Provider  acetaminophen (TYLENOL) 500 MG tablet Take 1 tablet (500 mg total) by mouth every 6 (six) hours as needed. Patient not taking: Reported on 09/05/2019 07/13/16   Cheri Fowler, PA-C  aspirin 81 MG tablet Take 81 mg by mouth at bedtime.     [provider]  atorvastatin (LIPITOR) 20 MG tablet Take 1 tablet (20 mg total) by mouth daily. Patient taking differently: Take 20 mg by mouth at bedtime.  09/06/19   Rhetta Mura, MD    doxycycline (VIBRAMYCIN) 100 MG capsule Take 1 capsule (100 mg total) by mouth 2 (two) times daily. 06/18/20   Gwyneth Sprout, MD    Allergies    Penicillins, Simvastatin, and Ergocalciferol  Review of Systems   Review of Systems  Constitutional: Positive for chills, fatigue and fever.  Eyes: Negative for visual disturbance.  Respiratory: Positive for cough.   Cardiovascular: Positive for chest pain. Negative for palpitations and leg swelling.  Gastrointestinal: Positive for diarrhea (Resolved) and nausea. Negative for vomiting.  Musculoskeletal: Negative for neck pain.  Skin: Negative for color change and rash.  Psychiatric/Behavioral: Negative for confusion.  All other systems reviewed and are negative.   Physical Exam Updated Vital Signs BP (!) 136/70   Pulse 90   Temp 99.1 F (37.3 C) (Oral)   Resp 15   SpO2 94%   Physical Exam Vitals and nursing note reviewed.  Constitutional:      General: He is not in acute distress.    Appearance: He is well-developed. He is not diaphoretic.  HENT:     Head: Normocephalic and atraumatic.  Eyes:     General: No scleral icterus.       Right eye: No discharge.        Left eye: No discharge.     Conjunctiva/sclera: Conjunctivae normal.  Cardiovascular:     Rate and Rhythm: Normal rate and regular rhythm.     Heart sounds: Normal heart sounds.  Pulmonary:     Effort: Pulmonary effort is normal. No respiratory distress.     Breath sounds: No stridor.  Chest:     Chest wall: No tenderness.  Abdominal:     General: There is no distension.  Musculoskeletal:        General: No deformity.     Cervical back: Normal range of motion.     Right lower leg: No tenderness. No edema.     Left lower leg: No tenderness. No edema.  Skin:    General: Skin is warm and dry.  Neurological:     General: No focal deficit present.     Mental Status: He is alert.     Cranial Nerves: No cranial nerve deficit.     Motor: No abnormal muscle  tone.  Psychiatric:        Mood and Affect: Mood normal.        Behavior: Behavior normal.     ED Results / Procedures / Treatments   Labs (all labs ordered are listed, but only abnormal results are displayed) Labs Reviewed  BASIC METABOLIC PANEL - Abnormal; Notable for the following components:      Result Value   Glucose, Bld 151 (*)    All other components within normal limits  CBC - Abnormal; Notable for the following components:   MCV 100.2 (*)    MCH 34.5 (*)    All other components within normal limits  TROPONIN I (HIGH SENSITIVITY) - Abnormal; Notable for the following components:   Troponin I (High Sensitivity) 19 (*)    All other components within normal limits  TROPONIN I (HIGH SENSITIVITY) - Abnormal; Notable for the following components:   Troponin I (High Sensitivity) 22 (*)    All other components within normal limits  CBG MONITORING, ED    EKG EKG Interpretation  Date/Time:  Wednesday June 23 2020 11:51:40 EDT Ventricular Rate:  95 PR Interval:  118 QRS Duration: 124 QT Interval:  400 QTC Calculation: 502 R Axis:   -57 Text Interpretation: Sinus rhythm with occasional Premature ventricular complexes and Premature atrial complexes Left axis deviation Non-specific intra-ventricular conduction delay Nonspecific ST and T wave abnormality Abnormal ECG Confirmed by Marianna Fuss ([id number]) on 06/23/2020 12:09:53 PM   Radiology DG Chest 2 View  Result Date: 06/23/2020 CLINICAL DATA:  Mid chest pain and nausea.  Cough. EXAM: CHEST - 2 VIEW COMPARISON:  06/18/2020. FINDINGS: Cardiac silhouette is normal in size. No mediastinal or hilar masses or evidence of adenopathy. Lungs are hyperexpanded, but clear. No pleural effusion or pneumothorax. Skeletal structures are intact. IMPRESSION: 1. No acute cardiopulmonary disease. 2. Hyperexpanded lungs suggesting COPD. Electronically Signed   By: Amie Portland M.D.   On: 06/23/2020 13:11    Procedures Procedures (including  critical care time)  Medications Ordered in ED Medications  sodium chloride flush (NS) 0.9 % injection 3 mL (3 mLs Intravenous Not Given 06/23/20 1900)  ondansetron (ZOFRAN) injection 4 mg (has no administration in time range)  lactated ringers infusion (has no administration in time range)    Followed by  lactated ringers infusion (has no administration in time range)    ED Course  I have reviewed the triage vital signs and the nursing notes.  Pertinent labs & imaging results that were available during my care of the patient were reviewed by me and considered in my medical decision making (see chart for details).  Clinical Course as of Jun 23 1957  Wed Jun 23, 2020  1950 Patient's RN informing that patient wishes to leave.  I went and spoke with patient.  We discussed that I am concerned for ACS/PE or other significant acute cardiopulmonary cause of his symptoms.  We discussed that Covid puts him at a high risk of complications.  I explained why I had ordered an IV for nausea medications, I told him that I would allow him to drink fluid, in moderation, after getting nausea medicines in an attempt to persuade him to stay however he continued to refuse this.  He was able to state back to me the concerns, reasons why I want him to stay with testings performed, and possible outcomes of leaving AMA including death, disability that may be severe and others.  I encouraged him that he can return to the emergency room at any point to get evaluation.  He states that he wants to go home so he can eat.  These things to him here however he refused.  Patient will sign out AGAINST MEDICAL ADVICE.   [EH]    Clinical Course User Index [EH] Norman Clay   MDM Rules/Calculators/A&P                         Patient is a  69 year old man who presents today for evaluation of left-sided chest pain.  He is on day 11 of Covid 19.  He has significant cardiac history including cardiomyopathy, reduced  ejection fraction, prior stents.  Here today his troponin is 19 and 22.  CBC and BMP are unremarkable.  Chest x-ray obtained without evidence of consolidations or other acute abnormalities.  Given that patient has Covid with concern for prothrombotic events, and his chest pain is not reproducible on my exam I am concerned of a cardiac or pulmonary cause for his condition.  Patient stated that he wanted nausea medicine and to eat and drink.  I had ordered nausea medicine and was going to allow him to drink fluids with plans for CTA PE study to evaluate for PE in addition to possible admission for trending troponins given the elevation in his significant history puts him at high risk independent of his Covid infection.  Was made aware this, along with his EKG not being entirely normal.  He refused this stating that he wanted to go home.  Will I am comfortable giving him Zofran here while he would be monitored, based on his slightly prolonged QT interval in the setting of chest pain I do not think it is safe to give him Zofran and discharge.    We discussed the nature and purpose, risks and benefits, as well as, the alternatives of treatment. Time was given to allow the opportunity to ask questions and consider their options, and after the discussion, the patient decided to refuse the offerred treatment. The patient was informed that refusal could lead to, but was not limited to, death, permanent disability, or severe pain.  There was no family members present for me to ask to assist in convincing patient to stay.. Prior to refusing, I determined that the patient had the capacity to make their decision and understood the consequences of that decision. After refusal, I made every reasonable opportunity to treat them to the best of my ability.  The patient was notified that they may return to the emergency department at any time for further treatment.    Casey Jackson was evaluated in Emergency Department on  06/23/2020 for the symptoms described in the history of present illness. He was evaluated in the context of the global COVID-19 pandemic, which necessitated consideration that the patient might be at risk for infection with the SARS-CoV-2 virus that causes COVID-19. Institutional protocols and algorithms that pertain to the evaluation of patients at risk for COVID-19 are in a state of rapid change based on information released by regulatory bodies including the CDC and federal and state organizations. These policies and algorithms were followed during the patient's care in the ED.  Note: Portions of this report may have been transcribed using voice recognition software. Every effort was made to ensure accuracy; however, inadvertent computerized transcription errors may be present  Final Clinical Impression(s) / ED Diagnoses Final diagnoses:  Nausea  Chest pain    Rx / DC Orders ED Discharge Orders    None       Norman ClayHammond, Brelan Hannen W, PA-C 06/23/20 2337    Arby BarrettePfeiffer, Marcy, MD 06/30/20 [phone]

## 2020-06-23 NOTE — ED Triage Notes (Signed)
Patient reported was recently diagnose with Covid and back for further eval d/t now having some chest pain.

## 2020-06-23 NOTE — Discharge Instructions (Signed)
Today we discussed that I am concerned about your heart and your lungs.  We discussed that Covid makes you at a higher risk of having blood clots which if not appropriately diagnosed and treated can cause including, but not limited to pain, disability that may be severe, worsening of condition, and death.  We discussed that you appear to have some changes on your EKG, the electrical activity of your heart, since last time, my concern that nausea can be a symptom of issues with your heart or your lungs.  I recommended additional evaluation including additional blood work, IV medications, continued cardiac monitoring, CT scan and repeat evaluation which you refused.

## 2020-06-23 NOTE — ED Notes (Signed)
ED Provider at bedside. 

## 2020-06-23 NOTE — ED Notes (Signed)
Pt states that he does not want an IV and he is tired of being stuck for blood draws. Pt stated that he wants to leave and go to [place].

## 2020-06-23 NOTE — ED Notes (Signed)
The Pa and I both explained concerns with the Pt leaving. The pt stated that he wanted to go home eat and then go to [place]. We explained that he would have to start all over again in addition tot the dangers to himslef. The Pt stated he wanted to sign himself out AMA and leave. Pt signed form, same stored and printed.

## 2020-06-24 ENCOUNTER — Encounter (HOSPITAL_COMMUNITY): Payer: Self-pay | Admitting: Student

## 2020-06-24 ENCOUNTER — Emergency Department (HOSPITAL_COMMUNITY)
Admission: EM | Admit: 2020-06-24 | Discharge: 2020-06-24 | Disposition: A | Discharge status: Home / Self Care | Payer: Medicare Other | Attending: Emergency Medicine | Admitting: Emergency Medicine

## 2020-06-24 ENCOUNTER — Other Ambulatory Visit: Payer: Self-pay

## 2020-06-24 DIAGNOSIS — Z955 Presence of coronary angioplasty implant and graft: Secondary | ICD-10-CM | POA: Insufficient documentation

## 2020-06-24 DIAGNOSIS — Z7982 Long term (current) use of aspirin: Secondary | ICD-10-CM | POA: Insufficient documentation

## 2020-06-24 DIAGNOSIS — I5022 Chronic systolic (congestive) heart failure: Secondary | ICD-10-CM | POA: Diagnosis not present

## 2020-06-24 DIAGNOSIS — I2510 Atherosclerotic heart disease of native coronary artery without angina pectoris: Secondary | ICD-10-CM | POA: Insufficient documentation

## 2020-06-24 DIAGNOSIS — U071 COVID-19: Secondary | ICD-10-CM | POA: Diagnosis not present

## 2020-06-24 DIAGNOSIS — I252 Old myocardial infarction: Secondary | ICD-10-CM | POA: Diagnosis not present

## 2020-06-24 DIAGNOSIS — F17210 Nicotine dependence, cigarettes, uncomplicated: Secondary | ICD-10-CM | POA: Insufficient documentation

## 2020-06-24 DIAGNOSIS — R110 Nausea: Secondary | ICD-10-CM

## 2020-06-24 MED ORDER — ONDANSETRON 4 MG PO TBDP
4.0000 mg | ORAL_TABLET | Freq: Once | ORAL | Status: AC
  Administered 2020-06-24: 4 mg via ORAL
  Filled 2020-06-24: qty 1

## 2020-06-24 NOTE — ED Notes (Signed)
Patient given cup of water and crackers for PO challenge.

## 2020-06-24 NOTE — Social Work (Signed)
CSW called [facility] for pt per Dr. Lynnell Grain request.  Safe transport stated they were aware of pt needing transport they had already been called.  Please reconsult if new social work issues arise, CSW signing off.  Chrisha Vogel Tarpley-Carter, MSW, LCSW-A                  [facility] ED Transitions of CareClinical Social Worker [email or web address] [phone]

## 2020-06-24 NOTE — ED Notes (Addendum)
Patient BIB [facility] c/o ongoing SOB, nausea that is keeping him from eating, and left sided chest pain on inhalation.  Patient tested positive for COVID on 7/23.  Was seen at [facility] yesterday and left AMA because they did not treat his nausea fast enough. EKG 12-lead unremarkable with EMS.  EMS vitals were 94% on room air, patient did not desat on exertion, 80-HR, 122/84-BP, and temp 98.6. Patient very HOH.

## 2020-06-24 NOTE — Progress Notes (Signed)
TOC CM chart reviewed and pt diagnosed with COVID. Pt has other medical conditions that would benefit [facility]. Ed provider updated. Referral sent to [facility] for follow up in the community. Isidoro Donning RN CCM, [facility] ED TOC CM [phone]

## 2020-06-24 NOTE — Discharge Instructions (Addendum)
  COVID-19 isolation recommendations  Patients who have symptoms consistent with COVID-19 should self isolate until: At least 3 days (72 hours) have passed since recovery, defined as resolution of fever without the use of fever reducing medications and improvement in respiratory symptoms (e.g., cough, shortness of breath), and At least 7 days have passed since symptoms first appeared. Retesting is not required and not recommended as patients can continue to test positive for several weeks despite lack of symptoms.  Nausea/vomiting: Use the ondansetron (generic for Zofran) for nausea or vomiting.  This medication may not prevent all vomiting or nausea, but can help facilitate better hydration. Things that can help with nausea/vomiting also include peppermint/menthol candies, vitamin B12, and ginger.  Recommend follow-up with your primary care provider for any further management.  Return to the emergency department for shortness of breath, chest pain, abdominal pain, dizziness, passing out, uncontrolled vomiting, or any other major concerns.

## 2020-06-24 NOTE — ED Notes (Signed)
[facility]ed [facility] to transport pt home. Verified address on file. Pt will be provided mask and Face sheild for transport.

## 2020-06-24 NOTE — ED Provider Notes (Signed)
[facility]-EMERGENCY DEPT Provider Note   CSN: [id number] Arrival date & time: 06/24/20  [id number]     History Chief Complaint  Patient presents with  . Covid positive  . Chest Pain  . Shortness of Breath  . Nausea    Casey Jackson is a 69 y.o. male.  HPI      Casey Jackson is a 69 y.o. male, with a history of CAD, MI, hyperlipidemia, presenting to the ED with nausea for the last 2 days.  Patient states he was having symptoms of fever, cough, body aches, and diarrhea beginning July 20.  He states the symptoms resolved 2 days ago and then he began to have nausea. Patient was seen in the ED at [facility] yesterday.  He complained of chest discomfort at that time.  He states that discomfort felt like a soreness.  He states he felt as though they were concentrating on his chest soreness, which he thinks was from his coughing, instead of his nausea.  They recommended that he be admitted for observation, however, patient declined and left AMA. He denies any chest discomfort today.  Denies further fever, shortness of breath, cough, vomiting, diarrhea, abdominal pain, or any other complaints.  Past Medical History:  Diagnosis Date  . Coronary artery disease    a. Big NSTEMI 11/2014: cath with surgical disease but patient initially refused CABG. Compromise decision was made to do intervention on the RCA with a bare metal stent with medical therapy for other disease and follow-up as an outpatient.  [name]. HOH (hard of hearing)   . Hyperlipidemia   . Ischemic cardiomyopathy    a. a. Cath 12/22/14: EF 25%. b. 2D Echo 12/23/14: EF 25-30%, diffuse hypokinesis, akinesis of the entireinferolateral and inferior myocardium, mild MR.  . Myocardial infarction [facility](HCC)    NSTEMI  . Osteoarthritis   . Pneumonia   . Tobacco abuse   . Transaminitis     Patient Active Problem List   Diagnosis Date Noted  . Dizziness   . History of non-ST elevation myocardial infarction (NSTEMI)   .  Pain in the chest   . Palpitations   . ACS (acute coronary syndrome) (HCC) 05/01/2015  . Chest pain 05/01/2015  . Chronic systolic CHF (congestive heart failure) (HCC) 04/27/2015  . Coronary artery disease   . Transaminitis   . Tobacco abuse   . HOH (hard of hearing)   . Osteoarthritis   . Hyperlipidemia LDL goal <70 12/24/2014  . Cardiomyopathy, ischemic 12/24/2014  . NSTEMI (non-ST elevated myocardial infarction) (HCC) 12/22/2014    Past Surgical History:  Procedure Laterality Date  . LEFT HEART CATHETERIZATION WITH CORONARY ANGIOGRAM N/A 12/22/2014   Procedure: LEFT HEART CATHETERIZATION WITH CORONARY ANGIOGRAM;  Surgeon: Corky CraftsJayadeep S Varanasi, MD; LAD 100%, CFX 90%, RI mild dz, RCA 95%/70%, EF 25%, CABG recommended  . PERCUTANEOUS CORONARY STENT INTERVENTION (PCI-S) N/A 12/23/2014   Procedure: PERCUTANEOUS CORONARY STENT INTERVENTION (PCI-S);  Surgeon: Corky CraftsJayadeep S Varanasi, MD; 3.0 x 24 rebel BMS to the RCA        Family History  Problem Relation Age of Onset  . Cancer Father   . Hypertension Other   . Heart attack Neg Hx   . Stroke Neg Hx     Social History   Tobacco Use  . Smoking status: Current Every Day Smoker    Packs/day: 0.50    Years: 50.00    Pack years: 25.00    Types: Cigarettes  . Smokeless tobacco: Current User  Vaping Use  . Vaping Use: Never used  Substance Use Topics  . Alcohol use: No    Alcohol/week: 0.0 standard drinks  . Drug use: No    Home Medications Prior to Admission medications   Medication Sig Start Date End Date Taking? Authorizing Provider  acetaminophen (TYLENOL) 500 MG tablet Take 1 tablet (500 mg total) by mouth every 6 (six) hours as needed. Patient not taking: Reported on 09/05/2019 07/13/16   Cheri Fowler, PA-C  aspirin 81 MG tablet Take 81 mg by mouth at bedtime.     [provider]  atorvastatin (LIPITOR) 20 MG tablet Take 1 tablet (20 mg total) by mouth daily. Patient taking differently: Take 20 mg by mouth at  bedtime.  09/06/19   Rhetta Mura, MD  doxycycline (VIBRAMYCIN) 100 MG capsule Take 1 capsule (100 mg total) by mouth 2 (two) times daily. 06/18/20   Gwyneth Sprout, MD    Allergies    Penicillins, Simvastatin, and Ergocalciferol  Review of Systems   Review of Systems  Constitutional: Negative for chills, diaphoresis and fever.  Respiratory: Negative for cough and shortness of breath.   Cardiovascular: Negative for chest pain.  Gastrointestinal: Positive for nausea. Negative for abdominal pain, blood in stool, diarrhea and vomiting.  Musculoskeletal: Negative for back pain and neck pain.  Neurological: Negative for dizziness, syncope and weakness.  All other systems reviewed and are negative.   Physical Exam Updated Vital Signs BP (!) 122/58 (BP Location: Right Arm)   Pulse 77   Temp 99.1 F (37.3 C) (Oral)   Resp (!) 30   Ht 5\' 11"  (1.803 m)   Wt 90.7 kg   SpO2 94%   BMI 27.89 kg/m   Physical Exam Vitals and nursing note reviewed.  Constitutional:      General: He is not in acute distress.    Appearance: He is well-developed. He is not diaphoretic.  HENT:     Head: Normocephalic and atraumatic.     Mouth/Throat:     Mouth: Mucous membranes are moist.     Pharynx: Oropharynx is clear.  Eyes:     Conjunctiva/sclera: Conjunctivae normal.  Cardiovascular:     Rate and Rhythm: Normal rate and regular rhythm.     Pulses: Normal pulses.          Radial pulses are 2+ on the right side and 2+ on the left side.       Posterior tibial pulses are 2+ on the right side and 2+ on the left side.     Heart sounds: Normal heart sounds.     Comments: Tactile temperature in the extremities appropriate and equal bilaterally. Pulmonary:     Effort: Pulmonary effort is normal. No respiratory distress.     Breath sounds: Normal breath sounds.  Abdominal:     Palpations: Abdomen is soft.     Tenderness: There is no abdominal tenderness. There is no guarding.    Musculoskeletal:     Cervical back: Neck supple.     Right lower leg: No edema.     Left lower leg: No edema.  Lymphadenopathy:     Cervical: No cervical adenopathy.  Skin:    General: Skin is warm and dry.  Neurological:     Mental Status: He is alert.  Psychiatric:        Mood and Affect: Mood and affect normal.        Speech: Speech normal.        Behavior: Behavior normal.  ED Results / Procedures / Treatments   Labs (all labs ordered are listed, but only abnormal results are displayed) Labs Reviewed - No data to display  EKG EKG Interpretation  Date/Time:  Thursday June 24 2020 10:07:53 EDT Ventricular Rate:  82 PR Interval:    QRS Duration: 133 QT Interval:  412 QTC Calculation: 482 R Axis:   -63 Text Interpretation: Sinus rhythm Nonspecific IVCD with LAD Abnrm T, consider ischemia, anterolateral lds When compared to prior, simialr t wave inversions to october ECG. when compared to ECG yesterday, more t wave inversions in  leads 2,3,AVF,V4-V6. NO STEMI Confirmed by Theda Belfast ([id number]) on 06/24/2020 11:35:58 AM   Radiology DG Chest 2 View  Result Date: 06/23/2020 CLINICAL DATA:  Mid chest pain and nausea.  Cough. EXAM: CHEST - 2 VIEW COMPARISON:  06/18/2020. FINDINGS: Cardiac silhouette is normal in size. No mediastinal or hilar masses or evidence of adenopathy. Lungs are hyperexpanded, but clear. No pleural effusion or pneumothorax. Skeletal structures are intact. IMPRESSION: 1. No acute cardiopulmonary disease. 2. Hyperexpanded lungs suggesting COPD. Electronically Signed   By: Amie Portland M.D.   On: 06/23/2020 13:11    Procedures Procedures (including critical care time)  Medications Ordered in ED Medications  ondansetron (ZOFRAN-ODT) disintegrating tablet 4 mg (4 mg Oral Given 06/24/20 1116)    ED Course  I have reviewed the triage vital signs and the nursing notes.  Pertinent labs & imaging results that were available during my care of the patient  were reviewed by me and considered in my medical decision making (see chart for details).  Clinical Course as of Jun 25 1347  Thu Jun 24, 2020  1120 [facility] infusion team contacted about this patient.  Cline Crock, PA-C states he is not a candidate for infusion due to duration of symptoms.   [SJ]  1217 Patient at 95%, I suspect this is an erroneous reading.  SpO2: 90 % [SJ]    Clinical Course User Index [SJ] Rielyn Krupinski, Hillard Danker, PA-C   MDM Rules/Calculators/A&P                          Patient presents with nausea. Patient is nontoxic appearing, afebrile, not tachycardic, not tachypneic, not hypotensive, maintains adequate SPO2 on room air, and is in no apparent distress.  The times when his SPO2 dropped below 94% were checked shortly thereafter and patient was found to have SPO2 actually above 94% and was in no distress.  I have reviewed the patient's chart to obtain more information.   I reviewed and interpreted the patient's labs and radiological studies from yesterday and these were reassuring. I discussed options for his visit today.  He states he would just like his nausea addressed.    The patient was given instructions for home care as well as return precautions. Patient voices understanding of these instructions, accepts the plan, and is comfortable with discharge.   Findings and plan of care discussed with Theda Belfast, MD. Dr. Rush Landmark personally evaluated and examined this patient.    Vitals:   06/24/20 1006 06/24/20 1030 06/24/20 1100 06/24/20 1130  BP: 121/69 (!) 124/64 (!) 139/79 (!) 122/58  Pulse: 80 74 80 77  Resp:  (!) 28 20 (!) 30  Temp:      TempSrc:      SpO2: 92% 93% 96% 94%  Weight:      Height:          Final Clinical Impression(s) /  ED Diagnoses Final diagnoses:  Nausea  COVID-19    Rx / DC Orders ED Discharge Orders    None       Concepcion Living 06/24/20 1348    Tegeler, Canary Brim, MD 06/24/20 [phone]

## 2021-06-13 ENCOUNTER — Emergency Department (HOSPITAL_COMMUNITY)
Admission: EM | Admit: 2021-06-13 | Discharge: 2021-06-13 | Disposition: A | Discharge status: Home / Self Care | Payer: Medicare HMO | Attending: Emergency Medicine | Admitting: Emergency Medicine

## 2021-06-13 ENCOUNTER — Other Ambulatory Visit: Payer: Self-pay

## 2021-06-13 ENCOUNTER — Encounter (HOSPITAL_COMMUNITY): Payer: Self-pay | Admitting: Emergency Medicine

## 2021-06-13 DIAGNOSIS — F17210 Nicotine dependence, cigarettes, uncomplicated: Secondary | ICD-10-CM | POA: Diagnosis not present

## 2021-06-13 DIAGNOSIS — L989 Disorder of the skin and subcutaneous tissue, unspecified: Secondary | ICD-10-CM | POA: Diagnosis present

## 2021-06-13 DIAGNOSIS — Z7982 Long term (current) use of aspirin: Secondary | ICD-10-CM | POA: Diagnosis not present

## 2021-06-13 DIAGNOSIS — I2510 Atherosclerotic heart disease of native coronary artery without angina pectoris: Secondary | ICD-10-CM | POA: Insufficient documentation

## 2021-06-13 DIAGNOSIS — Z951 Presence of aortocoronary bypass graft: Secondary | ICD-10-CM | POA: Diagnosis not present

## 2021-06-13 DIAGNOSIS — I5022 Chronic systolic (congestive) heart failure: Secondary | ICD-10-CM | POA: Diagnosis not present

## 2021-06-13 DIAGNOSIS — L0292 Furuncle, unspecified: Secondary | ICD-10-CM | POA: Insufficient documentation

## 2021-06-13 MED ORDER — DOXYCYCLINE HYCLATE 100 MG PO TABS
100.0000 mg | ORAL_TABLET | Freq: Two times a day (BID) | ORAL | 0 refills | Status: AC
Start: 2021-06-13 — End: 2021-06-20

## 2021-06-13 MED ORDER — MUPIROCIN 2 % EX OINT: TOPICAL_OINTMENT | Freq: Two times a day (BID) | CUTANEOUS | 0 refills | Status: DC

## 2021-06-13 NOTE — ED Provider Notes (Signed)
[facility] EMERGENCY DEPARTMENT Provider Note   CSN: [id number] Arrival date & time: 06/13/21  9326     History Chief Complaint  Patient presents with   Abscess    Casey Jackson is a 70 y.o. male.   Abscess  Patient presents to the ED for evaluation of a lesion above his eyebrow.  Patient states he noticed some redness and swelling above his right eyebrow a couple days ago.  He tried squeezing it.  Patient states he got a little bit of yellow drainage but it seemed to get worse after that.  He has noticed redness and increased swelling and some swelling down below his eye.  He denies any fevers or chills.  It is mostly itchy but somewhat tender.  He has had a lesion on his ear as well but that has been for about a month or so.  Past Medical History:  Diagnosis Date   Coronary artery disease    a. Big NSTEMI 11/2014: cath with surgical disease but patient initially refused CABG. Compromise decision was made to do intervention on the RCA with a bare metal stent with medical therapy for other disease and follow-up as an outpatient.   HOH (hard of hearing)    Hyperlipidemia    Ischemic cardiomyopathy    a. a. Cath 12/22/14: EF 25%. b. 2D Echo 12/23/14: EF 25-30%, diffuse hypokinesis, akinesis of the entireinferolateral and inferior myocardium, mild MR.   Myocardial infarction [facility])    NSTEMI   Osteoarthritis    Pneumonia    Tobacco abuse    Transaminitis     Patient Active Problem List   Diagnosis Date Noted   Dizziness    History of non-ST elevation myocardial infarction (NSTEMI)    Pain in the chest    Palpitations    ACS (acute coronary syndrome) (HCC) 05/01/2015   Chest pain 05/01/2015   Chronic systolic CHF (congestive heart failure) (HCC) 04/27/2015   Coronary artery disease    Transaminitis    Tobacco abuse    HOH (hard of hearing)    Osteoarthritis    Hyperlipidemia LDL goal <70 12/24/2014   Cardiomyopathy, ischemic 12/24/2014   NSTEMI (non-ST  elevated myocardial infarction) (HCC) 12/22/2014    Past Surgical History:  Procedure Laterality Date   LEFT HEART CATHETERIZATION WITH CORONARY ANGIOGRAM N/A 12/22/2014   Procedure: LEFT HEART CATHETERIZATION WITH CORONARY ANGIOGRAM;  Surgeon: Corky Crafts, MD; LAD 100%, CFX 90%, RI mild dz, RCA 95%/70%, EF 25%, CABG recommended   PERCUTANEOUS CORONARY STENT INTERVENTION (PCI-S) N/A 12/23/2014   Procedure: PERCUTANEOUS CORONARY STENT INTERVENTION (PCI-S);  Surgeon: Corky Crafts, MD; 3.0 x 24 rebel BMS to the RCA        Family History  Problem Relation Age of Onset   Cancer Father    Hypertension Other    Heart attack Neg Hx    Stroke Neg Hx     Social History   Tobacco Use   Smoking status: Every Day    Packs/day: 0.50    Years: 50.00    Pack years: 25.00    Types: Cigarettes   Smokeless tobacco: Current  Vaping Use   Vaping Use: Never used  Substance Use Topics   Alcohol use: No    Alcohol/week: 0.0 standard drinks   Drug use: No    Home Medications Prior to Admission medications   Medication Sig Start Date End Date Taking? Authorizing Provider  doxycycline (VIBRA-TABS) 100 MG tablet Take 1 tablet (100 mg  total) by mouth 2 (two) times daily for 7 days. 06/13/21 06/20/21 Yes Linwood Dibbles, MD  mupirocin ointment (BACTROBAN) 2 % Apply topically 2 (two) times daily. 06/13/21  Yes Linwood Dibbles, MD  acetaminophen (TYLENOL) 500 MG tablet Take 1 tablet (500 mg total) by mouth every 6 (six) hours as needed. Patient not taking: Reported on 09/05/2019 07/13/16   Cheri Fowler, PA-C  aspirin 81 MG tablet Take 81 mg by mouth at bedtime.     [provider]  atorvastatin (LIPITOR) 20 MG tablet Take 1 tablet (20 mg total) by mouth daily. Patient taking differently: Take 20 mg by mouth at bedtime.  09/06/19   Rhetta Mura, MD    Allergies    Penicillins, Simvastatin, and Ergocalciferol  Review of Systems   Review of Systems  All other systems reviewed and  are negative.  Physical Exam Updated Vital Signs BP 136/74 (BP Location: Right Arm)   Pulse 97   Temp 98.5 F (36.9 C) (Oral)   Resp 16   SpO2 98%   Physical Exam Vitals and nursing note reviewed.  Constitutional:      General: He is not in acute distress.    Appearance: He is well-developed.  HENT:     Head: Normocephalic.     Comments: Small raised area above the right eyebrow, small scab, no fluctuance, mild erythema and induration, no lymphangitic streaking, mild periorbital edema  Right ear with a healing scab    Right Ear: External ear normal.     Left Ear: External ear normal.  Eyes:     General: No scleral icterus.       Right eye: No discharge.        Left eye: No discharge.     Conjunctiva/sclera: Conjunctivae normal.  Neck:     Trachea: No tracheal deviation.  Cardiovascular:     Rate and Rhythm: Normal rate.  Pulmonary:     Effort: Pulmonary effort is normal. No respiratory distress.     Breath sounds: No stridor.  Abdominal:     General: There is no distension.  Musculoskeletal:        General: No swelling or deformity.     Cervical back: Neck supple.  Skin:    General: Skin is warm and dry.     Findings: No rash.  Neurological:     Mental Status: He is alert.     Cranial Nerves: Cranial nerve deficit: no gross deficits.    ED Results / Procedures / Treatments   Labs (all labs ordered are listed, but only abnormal results are displayed) Labs Reviewed - No data to display  EKG None  Radiology No results found.  Procedures Procedures   Medications Ordered in ED Medications - No data to display  ED Course  I have reviewed the triage vital signs and the nursing notes.  Pertinent labs & imaging results that were available during my care of the patient were reviewed by me and considered in my medical decision making (see chart for details).    MDM Rules/Calculators/A&P                          Patient appears to have a small furuncle  but still inflamed and infected.  No drainable abscess.  We will have him apply warm compresses and I have given prescription for antibiotics.  I will have him apply antibiotic ointment to his right ear but recommend follow-up with a primary care  doctor to make sure this in proving.  Would need to consider type of skin cancer if this lesion on his ear persists. Final Clinical Impression(s) / ED Diagnoses Final diagnoses:  Furuncle    Rx / DC Orders ED Discharge Orders          Ordered    doxycycline (VIBRA-TABS) 100 MG tablet  2 times daily        06/13/21 0959    mupirocin ointment (BACTROBAN) 2 %  2 times daily        06/13/21 9983             Linwood Dibbles, MD 06/13/21 1005

## 2021-06-13 NOTE — ED Triage Notes (Signed)
Pt arrives via [facility] for abscess over left eyebrow. This appeared Saturday, no known bites/injury. The site is swollen and has yellow drainage.

## 2021-06-13 NOTE — Discharge Instructions (Addendum)
Take the antibiotics as prescribed.  Apply the topical antibiotic ointment to your ear and above your eyebrow.  Apply warm compresses to help with the infection.  Follow-up with a primary care doctor next week to make sure the symptoms have improved.  Return to the ED for worsening symptoms fevers chills

## 2021-11-03 ENCOUNTER — Emergency Department (HOSPITAL_COMMUNITY)
Admission: EM | Admit: 2021-11-03 | Discharge: 2021-11-03 | Disposition: A | Discharge status: Home / Self Care | Payer: Medicare Other | Attending: Emergency Medicine | Admitting: Emergency Medicine

## 2021-11-03 ENCOUNTER — Other Ambulatory Visit: Payer: Self-pay

## 2021-11-03 DIAGNOSIS — Z7982 Long term (current) use of aspirin: Secondary | ICD-10-CM | POA: Diagnosis not present

## 2021-11-03 DIAGNOSIS — Z79899 Other long term (current) drug therapy: Secondary | ICD-10-CM | POA: Diagnosis not present

## 2021-11-03 DIAGNOSIS — L02213 Cutaneous abscess of chest wall: Secondary | ICD-10-CM | POA: Diagnosis present

## 2021-11-03 DIAGNOSIS — F17210 Nicotine dependence, cigarettes, uncomplicated: Secondary | ICD-10-CM | POA: Insufficient documentation

## 2021-11-03 DIAGNOSIS — I5022 Chronic systolic (congestive) heart failure: Secondary | ICD-10-CM | POA: Insufficient documentation

## 2021-11-03 DIAGNOSIS — L0291 Cutaneous abscess, unspecified: Secondary | ICD-10-CM

## 2021-11-03 DIAGNOSIS — I2510 Atherosclerotic heart disease of native coronary artery without angina pectoris: Secondary | ICD-10-CM | POA: Diagnosis not present

## 2021-11-03 MED ORDER — SULFAMETHOXAZOLE-TRIMETHOPRIM 800-160 MG PO TABS
1.0000 | ORAL_TABLET | Freq: Two times a day (BID) | ORAL | 0 refills | Status: AC
Start: 2021-11-03 — End: 2021-11-10

## 2021-11-03 NOTE — ED Provider Notes (Signed)
[facility] EMERGENCY DEPARTMENT Provider Note   CSN: [id number] Arrival date & time: 11/03/21  8127     History Chief Complaint  Patient presents with   Abscess    Casey Jackson is a 70 y.o. male presenting for evaluation of lesion of the left lower chest wall.  Patient states for the past 2 days he has had a lesion in this area.  He has been trying to drain it at home, however it is still painful and swollen.  He denies fevers, chills, nausea, vomiting.  No lesions elsewhere.  He is not taking anything for this including antibiotics or pain medicine. He has not yet had this evaluated   HPI     Past Medical History:  Diagnosis Date   Coronary artery disease    a. Big NSTEMI 11/2014: cath with surgical disease but patient initially refused CABG. Compromise decision was made to do intervention on the RCA with a bare metal stent with medical therapy for other disease and follow-up as an outpatient.   HOH (hard of hearing)    Hyperlipidemia    Ischemic cardiomyopathy    a. a. Cath 12/22/14: EF 25%. b. 2D Echo 12/23/14: EF 25-30%, diffuse hypokinesis, akinesis of the entireinferolateral and inferior myocardium, mild MR.   Myocardial infarction [facility])    NSTEMI   Osteoarthritis    Pneumonia    Tobacco abuse    Transaminitis     Patient Active Problem List   Diagnosis Date Noted   Dizziness    History of non-ST elevation myocardial infarction (NSTEMI)    Pain in the chest    Palpitations    ACS (acute coronary syndrome) (HCC) 05/01/2015   Chest pain 05/01/2015   Chronic systolic CHF (congestive heart failure) (HCC) 04/27/2015   Coronary artery disease    Transaminitis    Tobacco abuse    HOH (hard of hearing)    Osteoarthritis    Hyperlipidemia LDL goal <70 12/24/2014   Cardiomyopathy, ischemic 12/24/2014   NSTEMI (non-ST elevated myocardial infarction) (HCC) 12/22/2014    Past Surgical History:  Procedure Laterality Date   LEFT HEART CATHETERIZATION  WITH CORONARY ANGIOGRAM N/A 12/22/2014   Procedure: LEFT HEART CATHETERIZATION WITH CORONARY ANGIOGRAM;  Surgeon: Corky Crafts, MD; LAD 100%, CFX 90%, RI mild dz, RCA 95%/70%, EF 25%, CABG recommended   PERCUTANEOUS CORONARY STENT INTERVENTION (PCI-S) N/A 12/23/2014   Procedure: PERCUTANEOUS CORONARY STENT INTERVENTION (PCI-S);  Surgeon: Corky Crafts, MD; 3.0 x 24 rebel BMS to the RCA        Family History  Problem Relation Age of Onset   Cancer Father    Hypertension Other    Heart attack Neg Hx    Stroke Neg Hx     Social History   Tobacco Use   Smoking status: Every Day    Packs/day: 0.50    Years: 50.00    Pack years: 25.00    Types: Cigarettes   Smokeless tobacco: Current  Vaping Use   Vaping Use: Never used  Substance Use Topics   Alcohol use: No    Alcohol/week: 0.0 standard drinks   Drug use: No    Home Medications Prior to Admission medications   Medication Sig Start Date End Date Taking? Authorizing Provider  sulfamethoxazole-trimethoprim (BACTRIM DS) 800-160 MG tablet Take 1 tablet by mouth 2 (two) times daily for 7 days. 11/03/21 11/10/21 Yes Joette Schmoker, PA-C  acetaminophen (TYLENOL) 500 MG tablet Take 1 tablet (500 mg total) by mouth  every 6 (six) hours as needed. Patient not taking: Reported on 09/05/2019 07/13/16   Cheri Fowler, PA-C  aspirin 81 MG tablet Take 81 mg by mouth at bedtime.     [provider]  atorvastatin (LIPITOR) 20 MG tablet Take 1 tablet (20 mg total) by mouth daily. Patient taking differently: Take 20 mg by mouth at bedtime.  09/06/19   Rhetta Mura, MD  mupirocin ointment (BACTROBAN) 2 % Apply topically 2 (two) times daily. 06/13/21   Linwood Dibbles, MD    Allergies    Penicillins, Simvastatin, and Ergocalciferol  Review of Systems   Review of Systems  Constitutional:  Negative for fever.  Skin:        Lesion of L side chest wall   Physical Exam Updated Vital Signs BP (!) 174/76 (BP Location: Left  Arm)   Pulse (!) 110   Temp 98.1 F (36.7 C) (Oral)   Resp 16   SpO2 99%   Physical Exam Vitals and nursing note reviewed.  Constitutional:      General: He is not in acute distress.    Appearance: He is well-developed.  HENT:     Head: Normocephalic and atraumatic.  Eyes:     Extraocular Movements: Extraocular movements intact.  Cardiovascular:     Rate and Rhythm: Normal rate.  Pulmonary:     Effort: Pulmonary effort is normal.  Chest:       Comments: Quarter sized area of induration. No fluctuance. No streaking. No active drainage. Central area has some mild purulence.  Abdominal:     General: There is no distension.  Musculoskeletal:        General: Normal range of motion.     Cervical back: Normal range of motion.  Skin:    General: Skin is warm.     Findings: No rash.  Neurological:     Mental Status: He is alert and oriented to person, place, and time.    ED Results / Procedures / Treatments   Labs (all labs ordered are listed, but only abnormal results are displayed) Labs Reviewed - No data to display  EKG None  Radiology No results found.  Procedures Procedures   Medications Ordered in ED Medications - No data to display  ED Course  I have reviewed the triage vital signs and the nursing notes.  Pertinent labs & imaging results that were available during my care of the patient were reviewed by me and considered in my medical decision making (see chart for details).    MDM Rules/Calculators/A&P                           Patient presenting for evaluation of lesion of the left lateral chest wall.  On exam, patient appears nontoxic.  Heart rate mildly elevated, likely due to pain.  He is afebrile, bp normal, doubt sepsis.  Exam is consistent with infection, no fluctuance and no drainage at this time.  Patient has already been draining the lesion, as such, I do not feel I&D would be beneficial today.  Instead, patient will need antibiotics and warm  compresses.  Discussed this with patient.  Discussed importance of close follow-up and prompt return with any worsening symptoms.  At this time, patient appears safe for discharge.  Return precautions given.  Patient states he understands and agrees to plan  Final Clinical Impression(s) / ED Diagnoses Final diagnoses:  Abscess    Rx / DC Orders ED Discharge  Orders          Ordered    sulfamethoxazole-trimethoprim (BACTRIM DS) 800-160 MG tablet  2 times daily        11/03/21 1008             Ayannah Faddis, PA-C 11/03/21 1035    Milagros Loll, MD 11/04/21 1506

## 2021-11-03 NOTE — Discharge Instructions (Signed)
Take antibiotics as prescribed.  Take entire course, even if symptoms improve. Use warm compresses, 20 minutes at a time, 3 times a day to help with inflammation and pain. Use Tylenol as needed for pain. Clean with soap and water twice a day.  Otherwise do not try and drain the area. Return to the emergency room if you develop fevers, vomiting, worsening pain or drainage, or any new, worsening, or concerning symptoms.

## 2021-11-03 NOTE — ED Triage Notes (Signed)
Pt. Stated, I have an abscess on the left side of upper stomach for 2 days

## 2022-09-24 ENCOUNTER — Emergency Department (HOSPITAL_COMMUNITY): Payer: Medicare HMO

## 2022-09-24 ENCOUNTER — Encounter (HOSPITAL_COMMUNITY): Payer: Self-pay | Admitting: Emergency Medicine

## 2022-09-24 ENCOUNTER — Emergency Department (HOSPITAL_COMMUNITY)
Admission: EM | Admit: 2022-09-24 | Discharge: 2022-09-25 | Disposition: A | Discharge status: Home / Self Care | Payer: Medicare HMO | Attending: Emergency Medicine | Admitting: Emergency Medicine

## 2022-09-24 ENCOUNTER — Other Ambulatory Visit: Payer: Self-pay

## 2022-09-24 DIAGNOSIS — R0789 Other chest pain: Secondary | ICD-10-CM | POA: Diagnosis not present

## 2022-09-24 DIAGNOSIS — Z7982 Long term (current) use of aspirin: Secondary | ICD-10-CM | POA: Diagnosis not present

## 2022-09-24 DIAGNOSIS — I25119 Atherosclerotic heart disease of native coronary artery with unspecified angina pectoris: Secondary | ICD-10-CM | POA: Insufficient documentation

## 2022-09-24 DIAGNOSIS — F17200 Nicotine dependence, unspecified, uncomplicated: Secondary | ICD-10-CM | POA: Diagnosis not present

## 2022-09-24 DIAGNOSIS — I209 Angina pectoris, unspecified: Secondary | ICD-10-CM

## 2022-09-24 DIAGNOSIS — R079 Chest pain, unspecified: Secondary | ICD-10-CM | POA: Diagnosis present

## 2022-09-24 LAB — CBC
HCT: 40.4 % (ref 39.0–52.0)
Hemoglobin: 14.2 g/dL (ref 13.0–17.0)
MCH: 37.1 pg — ABNORMAL HIGH (ref 26.0–34.0)
MCHC: 35.1 g/dL (ref 30.0–36.0)
MCV: 105.5 fL — ABNORMAL HIGH (ref 80.0–100.0)
Platelets: 191 10*3/uL (ref 150–400)
RBC: 3.83 MIL/uL — ABNORMAL LOW (ref 4.22–5.81)
RDW: 13.2 % (ref 11.5–15.5)
WBC: 7.1 10*3/uL (ref 4.0–10.5)
nRBC: 0 % (ref 0.0–0.2)

## 2022-09-24 LAB — BASIC METABOLIC PANEL
Anion gap: 9 (ref 5–15)
BUN: 6 mg/dL — ABNORMAL LOW (ref 8–23)
CO2: 27 mmol/L (ref 22–32)
Calcium: 8.5 mg/dL — ABNORMAL LOW (ref 8.9–10.3)
Chloride: 103 mmol/L (ref 98–111)
Creatinine, Ser: 1.14 mg/dL (ref 0.61–1.24)
GFR, Estimated: >60 mL/min (ref >60)
Glucose, Bld: 93 mg/dL (ref 70–99)
Potassium: 3.7 mmol/L (ref 3.5–5.1)
Sodium: 139 mmol/L (ref 135–145)

## 2022-09-24 LAB — TROPONIN I (HIGH SENSITIVITY)
Troponin I (High Sensitivity): 17 ng/L (ref <18)
Troponin I (High Sensitivity): 22 ng/L — ABNORMAL HIGH (ref <18)

## 2022-09-24 LAB — D-DIMER, QUANTITATIVE: D-Dimer, Quant: 0.73 ug/mL-FEU — ABNORMAL HIGH (ref 0.00–0.50)

## 2022-09-24 LAB — CBG MONITORING, ED: Glucose-Capillary: 98 mg/dL (ref 70–99)

## 2022-09-24 MED ORDER — IOHEXOL 350 MG/ML SOLN
80.0000 mL | Freq: Once | INTRAVENOUS | Status: AC | PRN
  Administered 2022-09-24: 80 mL via INTRAVENOUS

## 2022-09-24 MED ORDER — ASPIRIN 81 MG PO CHEW: 324.0000 mg | CHEWABLE_TABLET | Freq: Once | ORAL | Status: DC

## 2022-09-24 NOTE — ED Provider Notes (Signed)
Care of patient assumed from Dr. Kathrynn Humble at 9 PM.  This patient presents for 2 days of atypical chest pain.  He is currently getting started on his work-up.  If work-up is reassuring, he can go home. Physical Exam  BP 119/63   Pulse 68   Temp 98.3 F (36.8 C)   Resp 18   Ht 5\' 11"  (1.803 m)   Wt 83.9 kg   SpO2 95%   BMI 25.80 kg/m   Physical Exam Vitals and nursing note reviewed.  Constitutional:      General: He is not in acute distress.    Appearance: He is well-developed. He is not ill-appearing, toxic-appearing or diaphoretic.  HENT:     Head: Normocephalic and atraumatic.  Eyes:     Extraocular Movements: Extraocular movements intact.     Conjunctiva/sclera: Conjunctivae normal.  Cardiovascular:     Rate and Rhythm: Normal rate and regular rhythm.     Heart sounds: No murmur heard. Pulmonary:     Effort: Pulmonary effort is normal. No tachypnea or respiratory distress.  Abdominal:     Palpations: Abdomen is soft.     Tenderness: There is no abdominal tenderness.  Musculoskeletal:        General: No swelling. Normal range of motion.     Cervical back: Normal range of motion and neck supple.     Right lower leg: No edema.     Left lower leg: No edema.  Skin:    General: Skin is warm and dry.     Capillary Refill: Capillary refill takes less than 2 seconds.     Coloration: Skin is not cyanotic or pale.  Neurological:     General: No focal deficit present.     Mental Status: He is alert and oriented to person, place, and time.  Psychiatric:        Mood and Affect: Mood normal.        Behavior: Behavior normal.    Procedures  Procedures  ED Course / MDM    Medical Decision Making Amount and/or Complexity of Data Reviewed Labs: ordered. Radiology: ordered.  Risk OTC drugs. Prescription drug management.   Patient's work-up, including CTA of chest, is reassuring.  On assessment, patient resting comfortably.  He has been asymptomatic throughout his stay in  the ED.  He describes his recent symptoms as left-sided chest pain that occurs postprandially and when he lays down on his left side.  His pain resolves when he stands up or turns onto his right side.  It has been going on for the past 2 days.  Chest pain is atypical.  Referral for cardiology follow-up to be sent.  Patient is stable for discharge.  Return precautions given.       Godfrey Pick, MD 09/25/22 [phone]

## 2022-09-24 NOTE — ED Triage Notes (Signed)
Patient complains of cp and brought by EMS. 1 sl ntg and 324 asa administered pta. 20 LF by ems. Alert and oriented. Pain 2/10 following ntg.

## 2022-09-24 NOTE — Discharge Instructions (Addendum)
Refills for your cholesterol medicine were sent to your pharmacy.  Please follow-up with the cardiologist as soon as possible.  Please return to the emergency department for any new or worsening symptoms of concern.

## 2022-09-24 NOTE — ED Provider Notes (Addendum)
[facility] EMERGENCY DEPARTMENT Provider Note   CSN: [id number] Arrival date & time: 09/24/22  1901     History  Chief Complaint  Patient presents with   Chest Pain    Pt arrive by EMS report gotten by Jaimie, CN for c/o cp for x 2. Per pt he got 324 mg ASA by EMS, no c/o cp at this time.     Casey Jackson is a 71 y.o. male.  HPI     71 year old male comes in with chief complaint of chest pain.  Patient indicates he has been having chest pain for the last today on the left side.  Chest pain is described as dull pain, constant at about 2 out of 10.  The pain is moderately severe when he lays down and leans to the left side and also with deep cough.  There is no pain with inspiration.  Pt has no hx of PE, DVT and denies any exogenous hormone (testosterone / estrogen) use, long distance travels or surgery in the past 6 weeks, active cancer, recent immobilization.  Patient denies any strenuous activity.  Pain not worse with shoulder movement or lifting any objects.  He has history of CAD.  States that the pain with MI was sharp and severe, this 1 is more dull and achy.  Home Medications Prior to Admission medications   Medication Sig Start Date End Date Taking? Authorizing Provider  acetaminophen (TYLENOL) 500 MG tablet Take 1 tablet (500 mg total) by mouth every 6 (six) hours as needed. Patient not taking: Reported on 09/05/2019 07/13/16   Cheri Fowler, PA-C  aspirin 81 MG tablet Take 81 mg by mouth at bedtime.     [provider]  atorvastatin (LIPITOR) 20 MG tablet Take 1 tablet (20 mg total) by mouth daily. Patient taking differently: Take 20 mg by mouth at bedtime.  09/06/19   Rhetta Mura, MD  mupirocin ointment (BACTROBAN) 2 % Apply topically 2 (two) times daily. 06/13/21   Linwood Dibbles, MD      Allergies    Penicillins, Simvastatin, and Ergocalciferol    Review of Systems   Review of Systems  All other systems reviewed and are  negative.   Physical Exam Updated Vital Signs BP 119/63   Pulse 68   Temp 98.3 F (36.8 C)   Resp 18   Ht 5\' 11"  (1.803 m)   Wt 83.9 kg   SpO2 95%   BMI 25.80 kg/m  Physical Exam Vitals and nursing note reviewed.  Constitutional:      Appearance: He is well-developed.  HENT:     Head: Atraumatic.  Cardiovascular:     Rate and Rhythm: Normal rate.     Pulses:          Radial pulses are 2+ on the right side and 2+ on the left side.     Heart sounds: Normal heart sounds.  Pulmonary:     Effort: Pulmonary effort is normal.  Musculoskeletal:     Cervical back: Neck supple.     Right lower leg: No tenderness. No edema.     Left lower leg: No tenderness. No edema.  Skin:    General: Skin is warm.  Neurological:     Mental Status: He is alert and oriented to person, place, and time.     ED Results / Procedures / Treatments   Labs (all labs ordered are listed, but only abnormal results are displayed) Labs Reviewed  BASIC METABOLIC PANEL  CBC  D-DIMER, QUANTITATIVE  CBG MONITORING, ED  TROPONIN I (HIGH SENSITIVITY)    EKG EKG Interpretation  Date/Time:  Sunday September 24 2022 21:21:25 EDT Ventricular Rate:  63 PR Interval:  129 QRS Duration: 137 QT Interval:  459 QTC Calculation: 470 R Axis:   -59 Text Interpretation: Sinus rhythm Nonspecific IVCD with LAD LVH with secondary repolarization abnormality Anterior Q waves, possibly due to LVH - not new inferior and lateral TWI not new Confirmed by Varney Biles [phone]) on 09/24/2022 9:23:15 PM  Radiology DG Chest Portable 1 View  Result Date: 09/24/2022 CLINICAL DATA:  Chest pain. EXAM: PORTABLE CHEST 1 VIEW COMPARISON:  Chest radiograph dated 06/23/2020. FINDINGS: Minimal left lung base atelectasis. No focal consolidation, pleural effusion or pneumothorax. The cardiac silhouette is within limits. No acute osseous pathology. Degenerative changes of the spine. IMPRESSION: No active disease. Electronically Signed    By: Anner Crete M.D.   On: 09/24/2022 20:39    Procedures Procedures    Medications Ordered in ED Medications  aspirin chewable tablet 324 mg (324 mg Oral Not Given 09/24/22 2022)    ED Course/ Medical Decision Making/ A&P                           Medical Decision Making Amount and/or Complexity of Data Reviewed Labs: ordered. Radiology: ordered.  Risk OTC drugs.   71 year old male comes in with chief complaint of chest pain.  He has history of CAD status post PCI in 2016.  Patient is a smoker.  Patient's cardiovascular and cardiopulmonary exam is overall reassuring.  Differential diagnosis considered includes acute coronary syndrome, dissection, pleural effusion, pneumonia, pericarditis, myocarditis, musculoskeletal pain.  Patient's pain is worse when he lays down, and turned to the left side.  There is no evidence of any trauma there.  Pericarditis possible given the positional nature to the pain and some pleuritic component.  PE considered given that the pain is worse with deep cough.  ACS less likely given reproducible nature to the pain.  Pain is nonradiating.  Plan is to get delta troponin, D-dimer. CT scan to be ordered if dimer is positive.  If the high-sensitivity troponin into 2 are negative, then patient can be discharged with outpatient follow-up with cardiology.  Patient's plan discussed with Dr. Doren Custard, who will assume care.  Final Clinical Impression(s) / ED Diagnoses Final diagnoses:  Angina pectoris ([place])  Atypical chest pain    Rx / DC Orders ED Discharge Orders          Ordered    Ambulatory referral to Cardiology       Comments: If you have not heard from the Cardiology office within the next 72 hours please call [phone].   09/24/22 2124              Varney Biles, MD 09/24/22 2123    Varney Biles, MD 09/24/22 4403    Varney Biles, MD 09/24/22 2124

## 2022-09-25 MED ORDER — ATORVASTATIN CALCIUM 20 MG PO TABS: 20.0000 mg | ORAL_TABLET | Freq: Every day | ORAL | 6 refills | Status: DC

## 2022-10-12 ENCOUNTER — Encounter: Payer: Self-pay | Admitting: *Deleted

## 2022-11-28 ENCOUNTER — Emergency Department (HOSPITAL_COMMUNITY)
Admission: EM | Admit: 2022-11-28 | Discharge: 2022-11-28 | Disposition: A | Discharge status: Home / Self Care | Payer: Medicare HMO | Attending: Emergency Medicine | Admitting: Emergency Medicine

## 2022-11-28 DIAGNOSIS — Z7982 Long term (current) use of aspirin: Secondary | ICD-10-CM | POA: Insufficient documentation

## 2022-11-28 DIAGNOSIS — L0291 Cutaneous abscess, unspecified: Secondary | ICD-10-CM

## 2022-11-28 DIAGNOSIS — L02213 Cutaneous abscess of chest wall: Secondary | ICD-10-CM | POA: Insufficient documentation

## 2022-11-28 LAB — CBG MONITORING, ED: Glucose-Capillary: 127 mg/dL — ABNORMAL HIGH (ref 70–99)

## 2022-11-28 MED ORDER — SULFAMETHOXAZOLE-TRIMETHOPRIM 800-160 MG PO TABS: 1.0000 | ORAL_TABLET | Freq: Two times a day (BID) | ORAL | 0 refills | Status: AC

## 2022-11-28 NOTE — ED Provider Triage Note (Signed)
Emergency Medicine Provider Triage Evaluation Note  Casey Jackson , a 71 y.o. male  was evaluated in triage.  Pt complains of "infection to back".  States wound for the past 3 days without trauma.  No fever, chills, nausea or vomiting.  No history of diabetes.  Review of Systems  Positive: Skin wound Negative: Fever, chills, nausea or vomiting  Physical Exam  BP (!) 128/54 (BP Location: Right Arm)   Pulse 96   Temp 97.9 F (36.6 C) (Oral)   Resp 19   SpO2 97%  Gen:   Awake, no distress   Resp:  Normal effort  MSK:   Moves extremities without difficulty  Other:  Right mid paraspinal thoracic back has approximately 3 cm area of induration with central scab without fluctuance.  Surrounding erythema  Medical Decision Making  Medically screening exam initiated at 8:48 AM.  Appropriate orders placed.  Eliot Popper was informed that the remainder of the evaluation will be completed by another provider, this initial triage assessment does not replace that evaluation, and the importance of remaining in the ED until their evaluation is complete.  Cellulitis, rule out abscess   Ezequiel Essex, MD 11/28/22 [phone]

## 2022-11-28 NOTE — ED Triage Notes (Signed)
Patient BIB [facility] for evaluation of a cyst that on his back that he states he first noticed several days ago. Patient is alert, oriented, and in no apparent distress at this time.

## 2022-11-28 NOTE — ED Provider Notes (Signed)
[facility] EMERGENCY DEPARTMENT Provider Note   CSN: [id number] Arrival date & time: 11/28/22  3710     History  Chief Complaint  Patient presents with   Cyst    Casey Jackson is a 72 y.o. male.  Patient is a 72 year old male who presents with a wound to his upper back.  He says it has been there for the last 3 to 4 days.  No injuries to the area.  He was seen earlier in December for an abscess on his lower chest wall that he says resolved after he started antibiotics.  On chart review, this was Bactrim.  He denies any fevers.  No drainage to the area.  He said he started using hot water bottle to the area and the scab developed but he has not noted any drainage.  No fevers.  No vomiting.       Home Medications Prior to Admission medications   Medication Sig Start Date End Date Taking? Authorizing Provider  acetaminophen (TYLENOL) 500 MG tablet Take 1 tablet (500 mg total) by mouth every 6 (six) hours as needed. Patient not taking: Reported on 09/05/2019 07/13/16   Gloriann Loan, PA-C  aspirin 81 MG tablet Take 81 mg by mouth at bedtime.     [provider]  atorvastatin (LIPITOR) 20 MG tablet Take 1 tablet (20 mg total) by mouth daily. 09/25/22   Godfrey Pick, MD  mupirocin ointment (BACTROBAN) 2 % Apply topically 2 (two) times daily. 06/13/21   Dorie Rank, MD      Allergies    Penicillins, Simvastatin, and Ergocalciferol    Review of Systems   Review of Systems  Constitutional:  Negative for fever.  Gastrointestinal:  Negative for nausea and vomiting.  Musculoskeletal:  Negative for arthralgias, back pain, joint swelling and neck pain.  Skin:  Positive for wound.  Neurological:  Negative for weakness, numbness and headaches.    Physical Exam Updated Vital Signs BP (!) 128/54 (BP Location: Right Arm)   Pulse 96   Temp 97.9 F (36.6 C) (Oral)   Resp 19   SpO2 97%  Physical Exam Constitutional:      Appearance: He is well-developed.  HENT:      Head: Normocephalic and atraumatic.  Cardiovascular:     Rate and Rhythm: Normal rate.  Pulmonary:     Effort: Pulmonary effort is normal.  Musculoskeletal:        General: No tenderness.     Cervical back: Normal range of motion and neck supple.  Skin:    General: Skin is warm and dry.     Comments: 3 cm area of induration to his right mid back area.  There is a mild amount of surrounding induration.  There is a scab over the central area.  No fluctuance.  Neurological:     Mental Status: He is alert and oriented to person, place, and time.     ED Results / Procedures / Treatments   Labs (all labs ordered are listed, but only abnormal results are displayed) Labs Reviewed  CBG MONITORING, ED    EKG None  Radiology No results found.  Procedures Procedures    Medications Ordered in ED Medications - No data to display  ED Course/ Medical Decision Making/ A&P                           Medical Decision Making  EMERGENCY DEPARTMENT US SOFT TISSUE INTERPRETATION "Study:  Limited Soft Tissue Ultrasound"  INDICATIONS: Soft tissue infection Multiple views of the body part were obtained in real-time with a multi-frequency linear probe  PERFORMED BY: Myself IMAGES ARCHIVED?: Yes SIDE:Right  BODY PART:Upper back INTERPRETATION:  No abcess noted  Pt presents with an infection to his upper back.  He otherwise is well-appearing.  He does not appear systemically ill.  There is an area of induration but I do not appreciate any abscess collection on ultrasound.  I do not palpate any fluctuance.  Will start on antibiotics.  Instructed him to continue using the hot compresses.  Return precautions were given.  Final Clinical Impression(s) / ED Diagnoses Final diagnoses:  Abscess    Rx / DC Orders ED Discharge Orders     None         Malvin Johns, MD 11/28/22 1214

## 2022-12-15 ENCOUNTER — Other Ambulatory Visit: Payer: Self-pay

## 2022-12-15 ENCOUNTER — Emergency Department (HOSPITAL_COMMUNITY)
Admission: EM | Admit: 2022-12-15 | Discharge: 2022-12-15 | Disposition: A | Discharge status: Home / Self Care | Payer: Medicare HMO | Attending: Emergency Medicine | Admitting: Emergency Medicine

## 2022-12-15 ENCOUNTER — Encounter (HOSPITAL_COMMUNITY): Payer: Self-pay

## 2022-12-15 DIAGNOSIS — I2510 Atherosclerotic heart disease of native coronary artery without angina pectoris: Secondary | ICD-10-CM | POA: Insufficient documentation

## 2022-12-15 DIAGNOSIS — T476X5A Adverse effect of antidiarrheal drugs, initial encounter: Secondary | ICD-10-CM | POA: Insufficient documentation

## 2022-12-15 DIAGNOSIS — F17210 Nicotine dependence, cigarettes, uncomplicated: Secondary | ICD-10-CM | POA: Diagnosis not present

## 2022-12-15 DIAGNOSIS — I5022 Chronic systolic (congestive) heart failure: Secondary | ICD-10-CM | POA: Diagnosis not present

## 2022-12-15 DIAGNOSIS — K5900 Constipation, unspecified: Secondary | ICD-10-CM | POA: Diagnosis present

## 2022-12-15 DIAGNOSIS — Z7982 Long term (current) use of aspirin: Secondary | ICD-10-CM | POA: Diagnosis not present

## 2022-12-15 DIAGNOSIS — K5903 Drug induced constipation: Secondary | ICD-10-CM | POA: Diagnosis not present

## 2022-12-15 MED ORDER — SORBITOL 70 % SOLN
960.0000 mL | TOPICAL_OIL | Freq: Once | ORAL | Status: AC
  Administered 2022-12-15: 960 mL via RECTAL
  Filled 2022-12-15: qty 240

## 2022-12-15 NOTE — ED Provider Triage Note (Addendum)
Emergency Medicine Provider Triage Evaluation Note  Casey Jackson , a 72 y.o. male  was evaluated in triage.  Pt complains of constipation.  Patient reports not having a bowel movement since Monday.  States he had diarrhea on Monday after eating microwavable meals and subsequently took over-the-counter Imodium.  States that he has not had a bowel movement since Monday.  States he usually has 2-3 bowel movements a day.  Took over-the-counter oral laxatives of which did not both bowel movement.  Denies fever, abdominal pain, nausea, vomiting.  States he still passing gas.  Patient requesting enema.  Review of Systems  Positive: See above Negative:   Physical Exam  BP 138/62 (BP Location: Right Arm)   Pulse 90   Temp 98.5 F (36.9 C) (Oral)   Resp 18   SpO2 96%  Gen:   Awake, no distress   Resp:  Normal effort  MSK:   Moves extremities without difficulty  Other:    Medical Decision Making  Medically screening exam initiated at 2:02 PM.  Appropriate orders placed.  Shaquan Missey was informed that the remainder of the evaluation will be completed by another provider, this initial triage assessment does not replace that evaluation, and the importance of remaining in the ED until their evaluation is complete.      Wilnette Kales, [place] 12/15/22 1404

## 2022-12-15 NOTE — ED Triage Notes (Signed)
Pt BIB [facility] from home d/t constipation since Monday. Pt reports taking imodium & having a small amt of diarrhea & abd cramping. 140/66, 96 bpm, resp 14, 99% on RA, A/Ox4.

## 2022-12-15 NOTE — ED Provider Notes (Signed)
[place] EMERGENCY DEPARTMENT AT [facility] Provider Note  CSN: [id number] Arrival date & time: 12/15/22 1249  Chief Complaint(s) Constipation  HPI Casey Jackson is a 72 y.o. male with history of ischemic cardiomyopathy, hypertension, hyperlipidemia presenting to the emergency department with constipation.  Patient reports that he had diarrhea on Monday without other symptoms such as bloody stool, fevers or chills, abdominal pain, nausea or vomiting.  He took a single dose of Imodium and has since been constipated.  He reports no bowel movement since then but has been passing gas.  He continues to deny any abdominal pain but has had a decreased appetite.  No nausea or vomiting.  He called paramedics because he has no transport to the emergency department and he was too cold to take the bus   Past Medical History Past Medical History:  Diagnosis Date   Coronary artery disease    a. Big NSTEMI 11/2014: cath with surgical disease but patient initially refused CABG. Compromise decision was made to do intervention on the RCA with a bare metal stent with medical therapy for other disease and follow-up as an outpatient.   HOH (hard of hearing)    Hyperlipidemia    Ischemic cardiomyopathy    a. a. Cath 12/22/14: EF 25%. b. 2D Echo 12/23/14: EF 25-30%, diffuse hypokinesis, akinesis of the entireinferolateral and inferior myocardium, mild MR.   Myocardial infarction [facility])    NSTEMI   Osteoarthritis    Pneumonia    Tobacco abuse    Transaminitis    Patient Active Problem List   Diagnosis Date Noted   Dizziness    History of non-ST elevation myocardial infarction (NSTEMI)    Pain in the chest    Palpitations    ACS (acute coronary syndrome) (HCC) 05/01/2015   Chest pain 05/01/2015   Chronic systolic CHF (congestive heart failure) (HCC) 04/27/2015   Coronary artery disease    Transaminitis    Tobacco abuse    HOH (hard of hearing)    Osteoarthritis    Hyperlipidemia LDL goal  <70 12/24/2014   Cardiomyopathy, ischemic 12/24/2014   NSTEMI (non-ST elevated myocardial infarction) (HCC) 12/22/2014   Home Medication(s) Prior to Admission medications   Medication Sig Start Date End Date Taking? Authorizing Provider  acetaminophen (TYLENOL) 500 MG tablet Take 1 tablet (500 mg total) by mouth every 6 (six) hours as needed. Patient not taking: Reported on 09/05/2019 07/13/16   Cheri Fowler, PA-C  aspirin 81 MG tablet Take 81 mg by mouth at bedtime.     [provider]  atorvastatin (LIPITOR) 20 MG tablet Take 1 tablet (20 mg total) by mouth daily. 09/25/22   Gloris Manchester, MD  mupirocin ointment (BACTROBAN) 2 % Apply topically 2 (two) times daily. 06/13/21   Linwood Dibbles, MD  Past Surgical History Past Surgical History:  Procedure Laterality Date   LEFT HEART CATHETERIZATION WITH CORONARY ANGIOGRAM N/A 12/22/2014   Procedure: LEFT HEART CATHETERIZATION WITH CORONARY ANGIOGRAM;  Surgeon: Jettie Booze, MD; LAD 100%, CFX 90%, RI mild dz, RCA 95%/70%, EF 25%, CABG recommended   PERCUTANEOUS CORONARY STENT INTERVENTION (PCI-S) N/A 12/23/2014   Procedure: PERCUTANEOUS CORONARY STENT INTERVENTION (PCI-S);  Surgeon: Jettie Booze, MD; 3.0 x 24 rebel BMS to the RCA    Family History Family History  Problem Relation Age of Onset   Cancer Father    Hypertension Other    Heart attack Neg Hx    Stroke Neg Hx     Social History Social History   Tobacco Use   Smoking status: Every Day    Packs/day: 0.50    Years: 50.00    Total pack years: 25.00    Types: Cigarettes   Smokeless tobacco: Current  Vaping Use   Vaping Use: Never used  Substance Use Topics   Alcohol use: No    Alcohol/week: 0.0 standard drinks of alcohol   Drug use: No   Allergies Penicillins, Simvastatin, and Ergocalciferol  Review of Systems Review of  Systems  All other systems reviewed and are negative.   Physical Exam Vital Signs  I have reviewed the triage vital signs BP 129/67 (BP Location: Right Arm)   Pulse 73   Temp 98.2 F (36.8 C)   Resp 17   SpO2 94%  Physical Exam Vitals and nursing note reviewed.  Constitutional:      General: He is not in acute distress.    Appearance: Normal appearance.  HENT:     Mouth/Throat:     Mouth: Mucous membranes are moist.  Eyes:     Conjunctiva/sclera: Conjunctivae normal.  Cardiovascular:     Rate and Rhythm: Normal rate and regular rhythm.  Pulmonary:     Effort: Pulmonary effort is normal. No respiratory distress.     Breath sounds: Normal breath sounds.  Abdominal:     General: Abdomen is flat.     Palpations: Abdomen is soft.     Tenderness: There is no abdominal tenderness.  Musculoskeletal:     Right lower leg: No edema.     Left lower leg: No edema.  Skin:    General: Skin is warm and dry.     Capillary Refill: Capillary refill takes less than 2 seconds.  Neurological:     Mental Status: He is alert and oriented to person, place, and time. Mental status is at baseline.  Psychiatric:        Mood and Affect: Mood normal.        Behavior: Behavior normal.     ED Results and Treatments Labs (all labs ordered are listed, but only abnormal results are displayed) Labs Reviewed - No data to display  Radiology No results found.  Pertinent labs & imaging results that were available during my care of the patient were reviewed by me and considered in my medical decision making (see MDM for details).  Medications Ordered in ED Medications  sorbitol, milk of mag, mineral oil, glycerin (SMOG) enema (960 mLs Rectal Given 12/15/22 2144)                                                                                                                                      Procedures Procedures  (including critical care time)  Medical Decision Making / ED Course   MDM:  72 year old male presenting to the emergency department with constipation.  Patient overall well-appearing with normal vital signs.  His physical exam is reassuring with no abdominal tenderness at all.  He has no complaints aside from constipation. Likely due to imodium use.  Will treat constipation with enema.  Extremely low concern for dangerous intra-abdominal process such as perforation, obstruction, volvulus, diverticulitis, he has had no abdominal pain, fevers or chills, nausea or vomiting.  Will reassess after enema.    Clinical Course as of 12/15/22 2222  Fri Dec 15, 2022  2221 Patient was able to have a bowel movement after smog enema.  He request discharge.  Discussed self-care with Mira[employer]. He feels much better. Will discharge patient to home. All questions answered. Patient comfortable with plan of discharge. Return precautions discussed with patient and specified on the after visit summary.  [WS]    Clinical Course User Index [WS] Cristie Hem, MD     Additional history obtained: -Additional history obtained from ems     Medicines ordered and prescription drug management: Meds ordered this encounter  Medications   sorbitol, milk of mag, mineral oil, glycerin (SMOG) enema    -I have reviewed the patients home medicines and have made adjustments as needed  Social Determinants of Health:  Diagnosis or treatment significantly limited by social determinants of health: lack of transportation    Reevaluation: After the interventions noted above, I reevaluated the patient and found that they have improved  Co morbidities that complicate the patient evaluation  Past Medical History:  Diagnosis Date   Coronary artery disease    a. Big NSTEMI 11/2014: cath with surgical disease but patient initially refused CABG. Compromise decision was made to  do intervention on the RCA with a bare metal stent with medical therapy for other disease and follow-up as an outpatient.   HOH (hard of hearing)    Hyperlipidemia    Ischemic cardiomyopathy    a. a. Cath 12/22/14: EF 25%. b. 2D Echo 12/23/14: EF 25-30%, diffuse hypokinesis, akinesis of the entireinferolateral and inferior myocardium, mild MR.   Myocardial infarction [facility])    NSTEMI   Osteoarthritis    Pneumonia    Tobacco abuse    Transaminitis       Dispostion: Disposition decision including need for  hospitalization was considered, and patient discharged from emergency department.    Final Clinical Impression(s) / ED Diagnoses Final diagnoses:  Drug-induced constipation     This chart was dictated using voice recognition software.  Despite best efforts to proofread,  errors can occur which can change the documentation meaning.    Lonell Grandchild, MD 12/15/22 2222

## 2022-12-15 NOTE — ED Notes (Signed)
RN chaperoned by NT for administration of enema. Patient tolerated infusion of half the solution and is currently sitting on bedside commode. Call bell given and patient instructed to call when done.

## 2023-05-24 ENCOUNTER — Other Ambulatory Visit: Payer: Self-pay

## 2023-05-24 ENCOUNTER — Emergency Department (HOSPITAL_COMMUNITY)
Admission: EM | Admit: 2023-05-24 | Discharge: 2023-05-24 | Disposition: A | Discharge status: Home / Self Care | Payer: Medicare HMO | Attending: Emergency Medicine | Admitting: Emergency Medicine

## 2023-05-24 ENCOUNTER — Encounter (HOSPITAL_COMMUNITY): Payer: Self-pay

## 2023-05-24 DIAGNOSIS — Z7982 Long term (current) use of aspirin: Secondary | ICD-10-CM | POA: Diagnosis not present

## 2023-05-24 DIAGNOSIS — X58XXXA Exposure to other specified factors, initial encounter: Secondary | ICD-10-CM | POA: Diagnosis not present

## 2023-05-24 DIAGNOSIS — Z760 Encounter for issue of repeat prescription: Secondary | ICD-10-CM | POA: Diagnosis not present

## 2023-05-24 DIAGNOSIS — S00411A Abrasion of right ear, initial encounter: Secondary | ICD-10-CM | POA: Diagnosis present

## 2023-05-24 MED ORDER — ATORVASTATIN CALCIUM 20 MG PO TABS: 20.0000 mg | ORAL_TABLET | Freq: Every day | ORAL | 6 refills | Status: DC

## 2023-05-24 NOTE — ED Provider Notes (Signed)
[place] EMERGENCY DEPARTMENT AT [facility] Provider Note   CSN: [id number] Arrival date & time: 05/24/23  1118     History  Chief Complaint  Patient presents with   Medication Refill    Casey Jackson is a 72 y.o. male.  Patient complains of being out of his Lipitor.  Patient states that he is unable to see his primary care doctor for a month.  Patient is worried that he will have chest pain if he does not take his Lipitor.  Patient reports he is trying to establish care with [facility].  He states it is a month before he can see them.  Patient states the only other medicine he takes is aspirin.  Patient denies any current chest pain he denies any shortness of breath  The history is provided by the patient. No language interpreter was used.  Medication Refill Medications taken before: yes - see home medications        Home Medications Prior to Admission medications   Medication Sig Start Date End Date Taking? Authorizing Provider  acetaminophen (TYLENOL) 500 MG tablet Take 1 tablet (500 mg total) by mouth every 6 (six) hours as needed. Patient not taking: Reported on 09/05/2019 07/13/16   Cheri Fowler, PA-C  aspirin 81 MG tablet Take 81 mg by mouth at bedtime.     [provider]  atorvastatin (LIPITOR) 20 MG tablet Take 1 tablet (20 mg total) by mouth daily. 09/25/22   Gloris Manchester, MD  mupirocin ointment (BACTROBAN) 2 % Apply topically 2 (two) times daily. 06/13/21   Linwood Dibbles, MD      Allergies    Penicillins, Simvastatin, and Ergocalciferol    Review of Systems   Review of Systems  All other systems reviewed and are negative.   Physical Exam Updated Vital Signs BP (!) 142/79 (BP Location: Right Arm)   Pulse 81   Temp 98.4 F (36.9 C) (Oral)   Resp 18   Ht 5\' 11"  (1.803 m)   Wt 83.9 kg   SpO2 100%   BMI 25.80 kg/m  Physical Exam Vitals and nursing note reviewed.  Constitutional:      Appearance: He is well-developed.  HENT:     Head:  Normocephalic.     Ears:     Comments: Scabbed abrasion right ear Cardiovascular:     Rate and Rhythm: Normal rate.  Pulmonary:     Effort: Pulmonary effort is normal.  Abdominal:     General: There is no distension.  Musculoskeletal:        General: Normal range of motion.     Cervical back: Normal range of motion.  Neurological:     Mental Status: He is alert and oriented to person, place, and time.     ED Results / Procedures / Treatments   Labs (all labs ordered are listed, but only abnormal results are displayed) Labs Reviewed - No data to display  EKG None  Radiology No results found.  Procedures Procedures    Medications Ordered in ED Medications - No data to display  ED Course/ Medical Decision Making/ A&P                             Medical Decision Making Patient complains of being out of Lipitor and is requesting a prescription  Risk Prescription drug management. Risk Details: Patient is given a prescription for Lipitor.  He is advised to keep his  follow-up appointment with [facility] medical as scheduled.  I discussed with the patient the scab to his right ear.  He reports the area was itchy and he scratches it he states that he frequently scratches in that area.  Patient states that he plans on seeing a dermatologist but needs to be referred by primary care physician.  I will give the patient the phone number for [facility] dermatology.           Final Clinical Impression(s) / ED Diagnoses Final diagnoses:  Abrasion of right ear, initial encounter  Medication refill    Rx / DC Orders ED Discharge Orders          Ordered    atorvastatin (LIPITOR) 20 MG tablet  Daily        05/24/23 1210           An After Visit Summary was printed and given to the patient.    Elson Areas, PA-C 05/24/23 1210    Elayne Snare K, DO 05/24/23 1224

## 2023-05-24 NOTE — ED Triage Notes (Signed)
Pt arrived to ED via POV. Pt states he needs his cholesterol med refilled as he cannot get in with PCP for one month. Pt denies any other complaints.

## 2023-12-23 ENCOUNTER — Encounter (HOSPITAL_COMMUNITY): Payer: Self-pay

## 2023-12-23 ENCOUNTER — Emergency Department (HOSPITAL_COMMUNITY)
Admission: EM | Admit: 2023-12-23 | Discharge: 2023-12-23 | Disposition: A | Discharge status: Home / Self Care | Payer: Medicare HMO | Attending: Emergency Medicine | Admitting: Emergency Medicine

## 2023-12-23 ENCOUNTER — Other Ambulatory Visit: Payer: Self-pay

## 2023-12-23 DIAGNOSIS — Z760 Encounter for issue of repeat prescription: Secondary | ICD-10-CM | POA: Diagnosis present

## 2023-12-23 DIAGNOSIS — I2510 Atherosclerotic heart disease of native coronary artery without angina pectoris: Secondary | ICD-10-CM | POA: Insufficient documentation

## 2023-12-23 DIAGNOSIS — M791 Myalgia, unspecified site: Secondary | ICD-10-CM | POA: Insufficient documentation

## 2023-12-23 DIAGNOSIS — Z7982 Long term (current) use of aspirin: Secondary | ICD-10-CM | POA: Insufficient documentation

## 2023-12-23 MED ORDER — ATORVASTATIN CALCIUM 20 MG PO TABS: 20.0000 mg | ORAL_TABLET | Freq: Every day | ORAL | 6 refills | Status: DC

## 2023-12-23 NOTE — ED Triage Notes (Signed)
Pt c/o left leg spasms that started last night after taking a friend's atorvastatin that were 40 mg. Pt states he usually takes atorvastatin 20 mg, but ran out and doesn't have a PCP to get a refill of his normal dose.

## 2023-12-23 NOTE — ED Provider Notes (Signed)
[place] EMERGENCY DEPARTMENT AT [facility] Provider Note   CSN: [id number] Arrival date & time: 12/23/23  1123     History  Chief Complaint  Patient presents with   Leg Pain   Medication Refill    Casey Jackson is a 73 y.o. male.  HPI    73 year old male comes in with chief complaint of leg cramps and medication refill.  He states that he has a history of CAD and he was started on high-dose cholesterol medication.  He started having leg cramps and his cardiologist reduced the cholesterol medication back to 20 mg, since then he has not had any cramps.  He is out of his medications, yesterday to his friends 40 mg statin and few hours later started having the same cramping pain in the same location, his left lower extremity that shoots up.  He is coming into the ER primarily to get medication refill.  He indicates that prior to him taking the medication he was completely fine and has not had any recent illnesses.  There is no history of DVT, PE.  Patient denies any trauma.  He denies any nausea, vomiting, dehydration.  He does not want any blood work, he just wants medication refill.  He does not want any medicine for pain at this time.  Home Medications Prior to Admission medications   Medication Sig Start Date End Date Taking? Authorizing Provider  acetaminophen (TYLENOL) 500 MG tablet Take 1 tablet (500 mg total) by mouth every 6 (six) hours as needed. Patient not taking: Reported on 09/05/2019 07/13/16   Cheri Fowler, PA-C  aspirin 81 MG tablet Take 81 mg by mouth at bedtime.     [provider]  atorvastatin (LIPITOR) 20 MG tablet Take 1 tablet (20 mg total) by mouth daily. 12/23/23   Derwood Kaplan, MD  mupirocin ointment (BACTROBAN) 2 % Apply topically 2 (two) times daily. 06/13/21   Linwood Dibbles, MD      Allergies    Penicillins, Simvastatin, and Ergocalciferol    Review of Systems   Review of Systems  All other systems reviewed and are  negative.   Physical Exam Updated Vital Signs BP (!) 157/77 (BP Location: Right Arm)   Pulse 80   Temp 97.7 F (36.5 C) (Oral)   Resp 18   Ht 5\' 11"  (1.803 m)   Wt 83.9 kg   SpO2 99%   BMI 25.80 kg/m  Physical Exam Vitals and nursing note reviewed.  Constitutional:      Appearance: He is well-developed.  HENT:     Head: Atraumatic.  Cardiovascular:     Rate and Rhythm: Normal rate.  Pulmonary:     Effort: Pulmonary effort is normal.  Abdominal:     Tenderness: There is no abdominal tenderness.  Musculoskeletal:        General: No swelling or tenderness.     Cervical back: Neck supple.     Right lower leg: No edema.     Left lower leg: No edema.  Skin:    General: Skin is warm.     Findings: No bruising.  Neurological:     Mental Status: He is alert and oriented to person, place, and time.     ED Results / Procedures / Treatments   Labs (all labs ordered are listed, but only abnormal results are displayed) Labs Reviewed - No data to display  EKG None  Radiology No results found.  Procedures Procedures    Medications Ordered in  ED Medications - No data to display  ED Course/ Medical Decision Making/ A&P                                 Medical Decision Making Risk Prescription drug management.   73 year old male with history of CAD on statin, with previous known history of intolerance to high-dose statin comes in with chief complaint of leg cramps similar to his statin related cramp.  Patient's cramping is in the same leg, and of same nature as it was when he was on high-dose cholesterol medication.  He admits to taking double the dose yesterday, which he borrowed from his friend.  Differential diagnosis considered for this patient includes DVT, myositis, medication side effects, dehydration, electrolyte abnormality.  I discussed with the patient the other possibilities, but he is adamant that his symptoms are because of his cholesterol.  He simply  wants a refill for appropriate medication sent to his [facility].  He does not have a PCP at this time, in the process of getting a new PCP.  He understands the workup in the ER is not comprehensive without any lab workup.  His exam is reassuring.  No signs of DVT, bruising.  We will give him his refill and advise return precautions if symptoms get worse.  Advised that he not take any cholesterol medication today.  He can take over-the-counter medication for symptom management and hydrate well.  Final Clinical Impression(s) / ED Diagnoses Final diagnoses:  Medication refill  Myalgia    Rx / DC Orders ED Discharge Orders          Ordered    atorvastatin (LIPITOR) 20 MG tablet  Daily        12/23/23 1203              Derwood Kaplan, MD 12/23/23 1224

## 2023-12-23 NOTE — Discharge Instructions (Signed)
Take over-the-counter medications today for pain control.  Ensure that you are hydrating well.  Prescription for your cholesterol medication has been sent.  Please follow-up with a primary care doctor for additional care.

## 2024-04-12 ENCOUNTER — Emergency Department (HOSPITAL_COMMUNITY)

## 2024-04-12 ENCOUNTER — Encounter (HOSPITAL_COMMUNITY): Payer: Self-pay

## 2024-04-12 ENCOUNTER — Other Ambulatory Visit: Payer: Self-pay

## 2024-04-12 ENCOUNTER — Observation Stay (HOSPITAL_COMMUNITY)
Admission: EM | Admit: 2024-04-12 | Discharge: 2024-04-13 | Disposition: A | Discharge status: Home / Self Care | Attending: Infectious Diseases | Admitting: Infectious Diseases

## 2024-04-12 DIAGNOSIS — H9193 Unspecified hearing loss, bilateral: Secondary | ICD-10-CM | POA: Diagnosis not present

## 2024-04-12 DIAGNOSIS — I824Y2 Acute embolism and thrombosis of unspecified deep veins of left proximal lower extremity: Principal | ICD-10-CM | POA: Insufficient documentation

## 2024-04-12 DIAGNOSIS — I5020 Unspecified systolic (congestive) heart failure: Secondary | ICD-10-CM | POA: Insufficient documentation

## 2024-04-12 DIAGNOSIS — M7989 Other specified soft tissue disorders: Secondary | ICD-10-CM

## 2024-04-12 DIAGNOSIS — Z7982 Long term (current) use of aspirin: Secondary | ICD-10-CM | POA: Diagnosis not present

## 2024-04-12 DIAGNOSIS — F17210 Nicotine dependence, cigarettes, uncomplicated: Secondary | ICD-10-CM | POA: Insufficient documentation

## 2024-04-12 DIAGNOSIS — F17220 Nicotine dependence, chewing tobacco, uncomplicated: Secondary | ICD-10-CM | POA: Insufficient documentation

## 2024-04-12 DIAGNOSIS — I252 Old myocardial infarction: Secondary | ICD-10-CM | POA: Insufficient documentation

## 2024-04-12 DIAGNOSIS — R0601 Orthopnea: Secondary | ICD-10-CM

## 2024-04-12 DIAGNOSIS — I2510 Atherosclerotic heart disease of native coronary artery without angina pectoris: Secondary | ICD-10-CM | POA: Diagnosis not present

## 2024-04-12 DIAGNOSIS — I255 Ischemic cardiomyopathy: Secondary | ICD-10-CM | POA: Insufficient documentation

## 2024-04-12 DIAGNOSIS — Z801 Family history of malignant neoplasm of trachea, bronchus and lung: Secondary | ICD-10-CM | POA: Diagnosis not present

## 2024-04-12 DIAGNOSIS — R0602 Shortness of breath: Secondary | ICD-10-CM

## 2024-04-12 DIAGNOSIS — Z955 Presence of coronary angioplasty implant and graft: Secondary | ICD-10-CM | POA: Insufficient documentation

## 2024-04-12 DIAGNOSIS — Z87891 Personal history of nicotine dependence: Secondary | ICD-10-CM | POA: Diagnosis not present

## 2024-04-12 DIAGNOSIS — Z79899 Other long term (current) drug therapy: Secondary | ICD-10-CM | POA: Diagnosis not present

## 2024-04-12 DIAGNOSIS — J439 Emphysema, unspecified: Secondary | ICD-10-CM | POA: Diagnosis not present

## 2024-04-12 DIAGNOSIS — J90 Pleural effusion, not elsewhere classified: Principal | ICD-10-CM | POA: Insufficient documentation

## 2024-04-12 DIAGNOSIS — E785 Hyperlipidemia, unspecified: Secondary | ICD-10-CM | POA: Insufficient documentation

## 2024-04-12 DIAGNOSIS — N179 Acute kidney failure, unspecified: Secondary | ICD-10-CM

## 2024-04-12 DIAGNOSIS — Z720 Tobacco use: Secondary | ICD-10-CM | POA: Diagnosis present

## 2024-04-12 LAB — URINALYSIS, COMPLETE (UACMP) WITH MICROSCOPIC
Bacteria, UA: NONE SEEN
Bilirubin Urine: NEGATIVE
Glucose, UA: NEGATIVE mg/dL
Ketones, ur: NEGATIVE mg/dL
Leukocytes,Ua: NEGATIVE
Nitrite: NEGATIVE
Protein, ur: NEGATIVE mg/dL
Specific Gravity, Urine: 1.012 (ref 1.005–1.030)
pH: 6 (ref 5.0–8.0)

## 2024-04-12 LAB — BASIC METABOLIC PANEL WITH GFR
Anion gap: 14 (ref 5–15)
BUN: 12 mg/dL (ref 8–23)
CO2: 24 mmol/L (ref 22–32)
Calcium: 8.8 mg/dL — ABNORMAL LOW (ref 8.9–10.3)
Chloride: 99 mmol/L (ref 98–111)
Creatinine, Ser: 1.41 mg/dL — ABNORMAL HIGH (ref 0.61–1.24)
GFR, Estimated: 53 mL/min — ABNORMAL LOW (ref >60)
Glucose, Bld: 117 mg/dL — ABNORMAL HIGH (ref 70–99)
Potassium: 4 mmol/L (ref 3.5–5.1)
Sodium: 137 mmol/L (ref 135–145)

## 2024-04-12 LAB — CBC
HCT: 45.5 % (ref 39.0–52.0)
Hemoglobin: 15.2 g/dL (ref 13.0–17.0)
MCH: 35.9 pg — ABNORMAL HIGH (ref 26.0–34.0)
MCHC: 33.4 g/dL (ref 30.0–36.0)
MCV: 107.6 fL — ABNORMAL HIGH (ref 80.0–100.0)
Platelets: 193 10*3/uL (ref 150–400)
RBC: 4.23 MIL/uL (ref 4.22–5.81)
RDW: 13.7 % (ref 11.5–15.5)
WBC: 6.8 10*3/uL (ref 4.0–10.5)
nRBC: 0 % (ref 0.0–0.2)

## 2024-04-12 LAB — TROPONIN I (HIGH SENSITIVITY)
Troponin I (High Sensitivity): 23 ng/L — ABNORMAL HIGH (ref <18)
Troponin I (High Sensitivity): 42 ng/L — ABNORMAL HIGH (ref <18)

## 2024-04-12 LAB — HEPARIN LEVEL (UNFRACTIONATED): Heparin Unfractionated: 0.16 [IU]/mL — ABNORMAL LOW (ref 0.30–0.70)

## 2024-04-12 LAB — HEMOGLOBIN A1C
Hgb A1c MFr Bld: 4.8 % (ref 4.8–5.6)
Mean Plasma Glucose: 91.06 mg/dL

## 2024-04-12 LAB — BRAIN NATRIURETIC PEPTIDE: B Natriuretic Peptide: 346.6 pg/mL — ABNORMAL HIGH (ref 0.0–100.0)

## 2024-04-12 MED ORDER — HEPARIN BOLUS VIA INFUSION
4500.0000 [IU] | Freq: Once | INTRAVENOUS | Status: AC
  Administered 2024-04-12: 4500 [IU] via INTRAVENOUS
  Filled 2024-04-12: qty 4500

## 2024-04-12 MED ORDER — NICOTINE 14 MG/24HR TD PT24
14.0000 mg | MEDICATED_PATCH | Freq: Every day | TRANSDERMAL | Status: DC
  Administered 2024-04-13: 14 mg via TRANSDERMAL
  Filled 2024-04-12 (×2): qty 1

## 2024-04-12 MED ORDER — ACETAMINOPHEN 325 MG PO TABS
650.0000 mg | ORAL_TABLET | Freq: Four times a day (QID) | ORAL | Status: DC | PRN
  Administered 2024-04-12: 650 mg via ORAL
  Filled 2024-04-12: qty 2

## 2024-04-12 MED ORDER — FUROSEMIDE 10 MG/ML IJ SOLN
40.0000 mg | Freq: Once | INTRAMUSCULAR | Status: AC
  Administered 2024-04-12: 40 mg via INTRAVENOUS
  Filled 2024-04-12: qty 4

## 2024-04-12 MED ORDER — ACETAMINOPHEN 650 MG RE SUPP: 650.0000 mg | Freq: Four times a day (QID) | RECTAL | Status: DC | PRN

## 2024-04-12 MED ORDER — SENNOSIDES-DOCUSATE SODIUM 8.6-50 MG PO TABS: 1.0000 | ORAL_TABLET | Freq: Every evening | ORAL | Status: DC | PRN

## 2024-04-12 MED ORDER — HEPARIN (PORCINE) 25000 UT/250ML-% IV SOLN
1350.0000 [IU]/h | INTRAVENOUS | Status: DC
  Administered 2024-04-12: 1250 [IU]/h via INTRAVENOUS
  Administered 2024-04-13: 1350 [IU]/h via INTRAVENOUS
  Filled 2024-04-12 (×2): qty 250

## 2024-04-12 MED ORDER — ATORVASTATIN CALCIUM 10 MG PO TABS
20.0000 mg | ORAL_TABLET | Freq: Every day | ORAL | Status: DC
Start: 2024-04-12 — End: 2024-04-13
  Administered 2024-04-12 – 2024-04-13 (×2): 20 mg via ORAL
  Filled 2024-04-12 (×2): qty 2

## 2024-04-12 MED ORDER — SODIUM CHLORIDE 0.9% FLUSH: 3.0000 mL | Freq: Two times a day (BID) | INTRAVENOUS | Status: DC

## 2024-04-12 MED ORDER — IOHEXOL 350 MG/ML SOLN
75.0000 mL | Freq: Once | INTRAVENOUS | Status: AC | PRN
  Administered 2024-04-12: 75 mL via INTRAVENOUS

## 2024-04-12 MED ORDER — ALBUTEROL SULFATE (2.5 MG/3ML) 0.083% IN NEBU: 2.5000 mg | INHALATION_SOLUTION | RESPIRATORY_TRACT | Status: DC | PRN

## 2024-04-12 NOTE — ED Notes (Signed)
 Provided patient with sandwhich and water.

## 2024-04-12 NOTE — ED Triage Notes (Signed)
 Pt bib [facility] from home for SOB for approximately 3 days.Pt reports increased sob when lying down.   Pt has cardiac stent that was placed in 2016. Pt is hard of hearing.   EMS VS BP 156/88 96% RA 80 HR

## 2024-04-12 NOTE — Progress Notes (Signed)
 PHARMACY - ANTICOAGULATION CONSULT NOTE  Pharmacy Consult for heparin    Indication: DVT  Allergies  Allergen Reactions   Penicillins Swelling and Other (See Comments)    Has patient had a PCN reaction causing immediate rash, facial/tongue/throat swelling, SOB or lightheadedness with hypotension: YES Has patient had a PCN reaction causing severe rash involving mucus membranes or skin necrosis: YES Has patient had a PCN reaction that required hospitalization NO, REACTION OCCURRED IN MD OFFICE Has patient had a PCN reaction occurring within the last 10 years: NO 1968 If all of the above answers are "NO", then may proceed with Cephalosporin use.    Simvastatin Other (See Comments)    Muscle cramps   Ergocalciferol Palpitations    Patient Measurements: Weight: 79.4 kg (175 lb) Heparin  dosing weight: 79.4kg   Vital Signs: Temp: 98.2 F (36.8 C) (05/17 1250) Temp Source: Oral (05/17 1250) BP: 136/83 (05/17 1215) Pulse Rate: 74 (05/17 1215)  Labs: Recent Labs    04/12/24 0820  HGB 15.2  HCT 45.5  PLT 193  CREATININE 1.41*  TROPONINIHS 23*    Estimated Creatinine Clearance: 51.2 mL/min (A) (by C-G formula based on SCr of 1.41 mg/dL (H)).   Medical History: Past Medical History:  Diagnosis Date   Coronary artery disease    a. Big NSTEMI 11/2014: cath with surgical disease but patient initially refused CABG. Compromise decision was made to do intervention on the RCA with a bare metal stent with medical therapy for other disease and follow-up as an outpatient.   HOH (hard of hearing)    Hyperlipidemia    Ischemic cardiomyopathy    a. a. Cath 12/22/14: EF 25%. b. 2D Echo 12/23/14: EF 25-30%, diffuse hypokinesis, akinesis of the entireinferolateral and inferior myocardium, mild MR.   Myocardial infarction [facility])    NSTEMI   Osteoarthritis    Pneumonia    Tobacco abuse    Transaminitis    Assessment: Patient presenting with CC of SOB, CTA negative for PE. Found to have DVT,  not on anticoagulation PTA. HgB 15.2 and PLTs 193. Pharmacy consulted to dose heparin  gtt.   Goal of Therapy:  Heparin  level 0.3-0.7 units/ml Monitor platelets by anticoagulation protocol: Yes   Plan:  Give 4500 units bolus x 1 Start heparin  infusion at 1250 units/hr Check anti-Xa level in 8 hours and daily while on heparin  Continue to monitor H&H and platelets  Mamie Searles, PharmD, BCCCP  04/12/2024,3:12 PM

## 2024-04-12 NOTE — Progress Notes (Signed)
 VASCULAR LAB    Left lower extremity venous duplex has been performed.  See CV proc for preliminary results.  Gave report to Dr. Windy Hatchet, Angelicia Lessner, RVT 04/12/2024, 10:45 AM

## 2024-04-12 NOTE — Consult Note (Signed)
 NAME:  Marina Desire, MRN:  [id number], DOB:  11/07/1951, LOS: 0 ADMISSION DATE:  04/12/2024, CONSULTATION DATE:  04/12/2024 REFERRING MD:  Sandie Cross, MD, CHIEF COMPLAINT:  pleural effusion   History of Present Illness:  Idriss Quackenbush is a 73 y.o. man with 75-pack-year smoking history who quit last week who presents for new patient evaluation of shortness of breath.  He notes several days of shortness of breath especially with exertion and when laying on his right side.  He says that he has been having worsening cough with sputum production and dyspnea which is what prompted him to quit smoking finally last Thursday.  Denies any fevers chills night sweats or hemoptysis.  No lower extremity edema.  He lives at home alone in a rented room and has a roommate.  He says he has a family history of lung cancer in multiple relatives and was nervous for lung cancer.  Pulmonary consulted to evaluate and assist with management of pleural effusion.  He was additionally found to have a lower extremity DVT in the right common femoral vein.  He has been started on heparin  drip.  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 rate (!) 23, height 5\' 11"  (1.803 m), weight 79.4 kg, SpO2 95%.       No intake or output data in the 24 hours ending 04/12/24 1748 Filed Weights   04/12/24 0817 04/12/24 1500  Weight: 79.4 kg 79.4 kg    Examination: General: Elderly, chronically ill-appearing HENT: Dry mucous membranes, right outer ear excoriation Lungs: Breath sounds fully diminished on the left, otherwise no wheezes or crackles, no increased work of  breathing Cardiovascular: Regular rate and rhythm Abdomen: Soft, nontender Extremities: No lower extremity edema, bilateral venous varicosities Neuro: Hard of hearing, normal speech, moves all 4 extremities GU: no foley  Resolved problem list   Assessment and Plan   Left-sided pleural effusion 75-pack-year smoking history, with recent cessation Emphysema HFrEF  He has bilateral pleural effusions but certainly much greater on the left than on the right.  Reasonable to have a trial of diuresis, however as given his family history and personal history I am concerned for malignancy.  Will plan for diagnostic and therapeutic thoracentesis tomorrow and endoscopy.  Discussed this plan with the patient and he is in agreement.  He does not need to be n.p.o. heparin  will need to be held 1 hour prior to procedures okay to leave on for now.   Louie Rover, MD Pulmonary and Critical Care Medicine [facility] 04/12/2024 6:28 PM Pager: see AMION  If no response to pager, please call critical care on call (see AMION) until 7pm After 7:00 pm call Elink        Labs   CBC: Recent Labs  Lab 04/12/24 0820  WBC 6.8  HGB 15.2  HCT 45.5  MCV 107.6*  PLT 193    Basic Metabolic Panel: Recent Labs  Lab 04/12/24 0820  NA 137  K 4.0  CL 99  CO2 24  GLUCOSE 117*  BUN 12  CREATININE 1.41*  CALCIUM  8.8*   GFR: Estimated Creatinine Clearance: 51.2  mL/min (A) (by C-G formula based on SCr of 1.41 mg/dL (H)). Recent Labs  Lab 04/12/24 0820  WBC 6.8    Liver Function Tests: No results for input(s): "AST", "ALT", "ALKPHOS", "BILITOT", "PROT", "ALBUMIN" in the last 168 hours. No results for input(s): "LIPASE", "AMYLASE" in the last 168 hours. No results for input(s): "AMMONIA" in the last 168 hours.  ABG    Component Value Date/Time   TCO2 22 04/17/2015 1411     Coagulation Profile: No results for input(s): "INR", "PROTIME" in the last 168 hours.  Cardiac Enzymes: No  results for input(s): "CKTOTAL", "CKMB", "CKMBINDEX", "TROPONINI" in the last 168 hours.  HbA1C: Hgb A1c MFr Bld  Date/Time Value Ref Range Status  12/22/2014 12:05 PM 5.0 <5.7 % Final    Comment:    (NOTE)                                                                       According to the ADA Clinical Practice Recommendations for 2011, when HbA1c is used as a screening test:  >=6.5%   Diagnostic of Diabetes Mellitus           (if abnormal result is confirmed) 5.7-6.4%   Increased risk of developing Diabetes Mellitus References:Diagnosis and Classification of Diabetes Mellitus,Diabetes Care,2011,34(Suppl 1):S62-S69 and Standards of Medical Care in         Diabetes - 2011,Diabetes Care,2011,34 (Suppl 1):S11-S61.     CBG: No results for input(s): "GLUCAP" in the last 168 hours.  Review of Systems:   +Shortness of breath, cough Otherwise 10 point review of system as reviewed in the HPI  Past Medical History:  He,  has a past medical history of Coronary artery disease, HOH (hard of hearing), Hyperlipidemia, Ischemic cardiomyopathy, Myocardial infarction (HCC), Osteoarthritis, Pneumonia, Tobacco abuse, and Transaminitis.   Surgical History:   Past Surgical History:  Procedure Laterality Date   LEFT HEART CATHETERIZATION WITH CORONARY ANGIOGRAM N/A 12/22/2014   Procedure: LEFT HEART CATHETERIZATION WITH CORONARY ANGIOGRAM;  Surgeon: Lucendia Rusk, MD; LAD 100%, CFX 90%, RI mild dz, RCA 95%/70%, EF 25%, CABG recommended   PERCUTANEOUS CORONARY STENT INTERVENTION (PCI-S) N/A 12/23/2014   Procedure: PERCUTANEOUS CORONARY STENT INTERVENTION (PCI-S);  Surgeon: Lucendia Rusk, MD; 3.0 x 24 rebel BMS to the RCA      Social History:   reports that he has been smoking cigarettes. He has a 25 pack-year smoking history. He uses smokeless tobacco. He reports that he does not drink alcohol and does not use drugs.   Family History:  His family history includes Cancer in his  father; Hypertension in an other family member; Lung cancer in his father. There is no history of Heart attack or Stroke.   Allergies Allergies  Allergen Reactions   Penicillins Swelling and Other (See Comments)    Has patient had a PCN reaction causing immediate rash, facial/tongue/throat swelling, SOB or lightheadedness with hypotension: YES Has patient had a PCN reaction causing severe rash involving mucus membranes or skin necrosis: YES Has patient had a PCN reaction that required hospitalization NO, REACTION OCCURRED IN MD OFFICE Has patient had a PCN reaction occurring within the last 10 years: NO 1968 If all of the above answers are "NO", then may  proceed with Cephalosporin use.    Simvastatin Other (See Comments)    Muscle cramps   Ergocalciferol Palpitations     Home Medications  Prior to Admission medications   Medication Sig Start Date End Date Taking? Authorizing Provider  aspirin  81 MG tablet Take 81 mg by mouth at bedtime.    Yes [provider]  atorvastatin  (LIPITOR ) 20 MG tablet Take 1 tablet (20 mg total) by mouth daily. 12/23/23  Yes Deatra Face, MD  acetaminophen  (TYLENOL ) 500 MG tablet Take 1 tablet (500 mg total) by mouth every 6 (six) hours as needed. Patient not taking: Reported on 09/05/2019 07/13/16   Rose, [name], PA-C  mupirocin  ointment (BACTROBAN ) 2 % Apply topically 2 (two) times daily. Patient not taking: Reported on 04/12/2024 06/13/21   Trish Furl, MD

## 2024-04-12 NOTE — ED Notes (Signed)
 Lab called to add on troponin

## 2024-04-12 NOTE — Progress Notes (Signed)
 Informed by lab of critical troponin of 42 ng/L up from 23 ng/L. Notified Dr. Carolee Churchman. Will continue to monitor.

## 2024-04-12 NOTE — H&P (Cosign Needed Addendum)
 Date: 04/12/2024               Patient Name:  Casey Jackson MRN: [id number]  DOB: [birth year] Age / Sex: 73 y.o., male   PCP: Patient, No Pcp Per              Medical Service: Internal Medicine Teaching Service              Attending Physician: Dr. Alwin Baars, Aretha Kubas, MD    First Contact: Joretta Newborn, MS4 Pager: [phone]   Second Contact: Dr. Cathey Clunes, MD Pager: [phone]       After Hours (After 5p/  First Contact Pager: [phone]  weekends / holidays): Second Contact Pager: [phone]   Chief Complaint: Shortness of breath  History of Present Illness:  Casey Jackson is a 73 y.o. male with PMHx of HLD, CAD with previous MI s/p PCI of the RCA  2016, HFrEF 20-25% in 2016, hearing difficulty and tobacco use presenting for shortness of breath for 3 days.   First noticed shortness of breath when laying down on right side forcing him to lay on the left. Now unable to lay flat without significant SOB. Denies CP or palpitations. Walks without assistance, but has had progressive SOB and it limits his ability to climb the stairs in his home. Does have a hx of MI with stent placement in 2019, but has not followed up with his cardiologist since 2021 due to transportation issues. Has 50 year smoking history, about 20 cigarettes per day but quit 3 days ago when cough started.   Reports persistent pain and swelling in his legs over the years, left greater than right. But has not recently noticed any pain or swelling in either legs. Has no history of blood blots in the past. Has a chronic cough with production of sputum, but denies hemoptysis. Denies recent weight loss, fevers, chills, decreased appetite. No recent colonoscopy and has not received yearly chest CT for lung cancer monitoring in the setting of chronic tobacco use. Patient reports family history of multiple malignancies but does not remember which ones.  Has had itching and bleeding of the left outer ear and ear lobe for the past  1-2 years, has not seen a dermatologist for this  Denies fevers, chills, abdominal pain, hematochezia, melena, dysuria or hematuria. No LUTS symptoms. No lower back pain.  No changes in stool frequency or color.   Meds:  ASA 81 mg  Atorvastatin  20 mg daily  Current Meds  Medication Sig   aspirin  81 MG tablet Take 81 mg by mouth at bedtime.    atorvastatin  (LIPITOR ) 20 MG tablet Take 1 tablet (20 mg total) by mouth daily.    Allergies: Allergies as of 04/12/2024 - Review Complete 04/12/2024  Allergen Reaction Noted   Penicillins Swelling and Other (See Comments) 11/30/2014   Simvastatin Other (See Comments) 01/01/2019   Ergocalciferol Palpitations 11/04/2015   Past Medical History:  Diagnosis Date   Coronary artery disease    a. Big NSTEMI 11/2014: cath with surgical disease but patient initially refused CABG. Compromise decision was made to do intervention on the RCA with a bare metal stent with medical therapy for other disease and follow-up as an outpatient.   HOH (hard of hearing)    Hyperlipidemia    Ischemic cardiomyopathy    a. a. Cath 12/22/14: EF 25%. b. 2D Echo 12/23/14: EF 25-30%, diffuse hypokinesis, akinesis of the entireinferolateral and inferior myocardium, mild MR.   Myocardial infarction (HCC)  NSTEMI   Osteoarthritis    Pneumonia    Tobacco abuse    Transaminitis    Family History:  Father: Lung cancer Brother: Lung cancer  Aunt: unknown cancer  Social History:  Lives with friend Ernestina Headland who is his Environmental health practitioner [phone]) Retired-previous Corporate investment banker Smoked cigarettes for 50 years, 20 cigarettes daily. Quit 3 days ago when SOB started No alcohol use since MI in 2016 No illicit drug use Independent in ADLs/iADLs No PCP currently  Review of Systems: A complete ROS was negative except as per HPI.   Physical Exam: Blood pressure 136/82, pulse 86, temperature (!) 97.5 F (36.4 C), temperature source Oral, resp. rate (!) 23,  height 5\' 11"  (1.803 m), weight 79.4 kg, SpO2 95%. Constitutional: Sitting up in bed, no acute distress HENT: normocephalic atraumatic, mucous membranes moist. No supraclavicular or submandibular lymph nodes palpable. Hearing impairment bilaterally Cardiovascular: Normal rate and rhythm, JVD to 3-4 inches below the mandible, bilateral buldging of the neck with inspiration Pulmonary/Chest: Normal work of breathing, crackles auscultated in bilateral lower and middle lobes Abdominal: Soft, non-tender, non-distended Neurological: alert & oriented x 3 MSK: Moving all limbs spontaneously Extremities: Warm and well perfused, bilateral pedal pulses palpated. 1+ pitting edema bilaterally. No redness.  Skin: Right pinna with crusted ulceration and dried blood (see media) Psych: Normal mood and affect  Reviewed Labs: BMP with Cr 1.41, GFR 53. CBC with MCV 107.6. BNP 346.6, initial troponin 23.  Imaging in the ED: EKG: personally reviewed my interpretation is sinus rhythm with LBBB seen on prior EKG in 2020 CXR: personally reviewed my interpretation is significant left pleural effusion with possible lower lobe loculation, mild right sided pleural effusion.  Assessment & Plan by Problem: Principal Problem:   Pleural effusion, bilateral Active Problems:   Cardiomyopathy, ischemic   Coronary artery disease   Tobacco abuse   HFrEF (heart failure with reduced ejection fraction) (HCC)  Acute HF Exacerbation (EF 20-25% 05/11/2025) Bilateral large L pleural effusion on CT Severe 3 vessel CAD s/p PCI of RCA in 2016 DDx included acute HF exacerbation vs ACS vs malignant effusion. Signs of congestion on exam. BNP of 346.  CXR showing significant left-sided and moderate right-sided pleural effusion with possible LLL loculation concerning for CHF exacerbation or a new malignant effusion in this person with severe smoking history and lack of PCP follow up. Last echo in 2016 with EF 20-25% and severe diffuse  hypokinesis. ACS is also possible given hx of MI and stent in 2019, but less likely given lack of angina and unchanged EKG from prior. Initial troponin is slightly elevated to 23, which could be in the setting of demand ischemia. - Start IV lasix  40 mg - Echo ordered - Trend troponin - Pulm crit care consulted for both diagnostic and therapeutic thoracentesis - Strict I&Os, daily weights  - PT/OT ordered  LLE DVT Doppler US  with DVT in LLE. No pain with palpation, increased swelling vs right leg or redness visualized. Risk factors include heavy tobacco use for 50+ years. No colonoscopy on file. No B symptoms or weight loss. CT PE study without PE. Now with L pleural effusion concerning for malignant effusion.  - Continue IV Heparin  - Will likely be able to be transitioned to DOAC in the AM  - Pending pleural effusion analysis will determine length of anticoagulation  Bilateral neck distention/pulsation - Bilateral carotid artery US  ordered  AKI Cr 1.41, GFR 53. Endorses normal PO intake at home. Baseline 1 -1.2 about  a year ago. Given history of HFrEF and congestion on examination, suspect cardiorenal etiology.  - Trial of Lasix  40 mg today - Trend BMP closely while being diuresed  - U/A - Strict in and out   Severe 3 vessel CAD s/p PCI of RCA in 2016 Ischemic cardiomyopathy HLDHTN Previously a patient of Dr. Christophe Cram. Has non-specified allergy to Simvastatin. Had been considered for CABG by thoracic surgery in 2016 after MI but patient declined. He also declined defibrillator placement. Lost to follow up since with multiple Ed visits.Has not tolerated BB or ACEi in the past per Cardiology notes in 2016-2021. Has not tolerated many statins in the past. Adherent to ASA 81 mg and Atorvastatin  20 mg daily.  - Continue home Atorvastatin  20 mg daily - Holding ASA  Tobacco use 50 pack year history.  - Nicotine  patch ordered - Will need AAA screening  Right pinna ulceration Concerning  for squamous or basal cell carcinoma. ED provider contacted ENT who recommended outpatient follow-up.  Code Status GOC conversation given complex cardiac history of current presentation. Patient is  Full code and full scope of care  Dispo: Admit patient to Inpatient with expected length of stay greater than 2 midnights.  Signed: Joretta Newborn, MS4  Attestation for Student Documentation:  I personally was present and re-performed the history, physical exam and medical decision-making activities of this service and have verified that the service and findings are accurately documented in the student's note.  Cathey Clunes, MD 04/12/2024, 6:23 PM

## 2024-04-12 NOTE — ED Provider Notes (Signed)
 [place] EMERGENCY DEPARTMENT AT [facility] Provider Note   CSN: [id number] Arrival date & time: 04/12/24  0865     History  Chief Complaint  Patient presents with   Shortness of Breath    Casey Jackson is a 73 y.o. male.  The history is provided by the patient and medical records. No language interpreter was used.  Shortness of Breath Severity:  Severe Onset quality:  Gradual Duration:  3 days Timing:  Intermittent Progression:  Waxing and waning Chronicity:  New Relieved by:  Sitting up Worsened by:  Nothing (laying down) Ineffective treatments:  None tried Associated symptoms: rash (ear)   Associated symptoms: no abdominal pain, no chest pain, no cough, no diaphoresis, no headaches, no neck pain, no vomiting and no wheezing        Home Medications Prior to Admission medications   Medication Sig Start Date End Date Taking? Authorizing Provider  acetaminophen  (TYLENOL ) 500 MG tablet Take 1 tablet (500 mg total) by mouth every 6 (six) hours as needed. Patient not taking: Reported on 09/05/2019 07/13/16   Arlina Benjamin, PA-C  aspirin  81 MG tablet Take 81 mg by mouth at bedtime.     [provider]  atorvastatin  (LIPITOR ) 20 MG tablet Take 1 tablet (20 mg total) by mouth daily. 12/23/23   Deatra Face, MD  mupirocin  ointment (BACTROBAN ) 2 % Apply topically 2 (two) times daily. 06/13/21   Trish Furl, MD      Allergies    Penicillins, Simvastatin, and Ergocalciferol    Review of Systems   Review of Systems  Constitutional:  Positive for fatigue. Negative for chills and diaphoresis.  HENT:  Negative for congestion.   Respiratory:  Positive for chest tightness and shortness of breath. Negative for cough and wheezing.   Cardiovascular:  Positive for leg swelling. Negative for chest pain and palpitations.  Gastrointestinal:  Negative for abdominal pain, constipation, diarrhea, nausea and vomiting.  Genitourinary:  Negative for dysuria, flank pain and  frequency.  Musculoskeletal:  Negative for back pain, neck pain and neck stiffness.  Skin:  Positive for rash (ear). Negative for wound.  Neurological:  Negative for weakness, light-headedness, numbness and headaches.  Psychiatric/Behavioral:  Negative for agitation and confusion.     Physical Exam Updated Vital Signs BP 136/82   Pulse 86   Temp 97.8 F (36.6 C) (Oral)   Resp (!) 23   Ht 5\' 11"  (1.803 m)   Wt 79.4 kg   SpO2 95%   BMI 24.41 kg/m  Physical Exam Vitals and nursing note reviewed.  Constitutional:      General: He is not in acute distress.    Appearance: He is well-developed. He is not ill-appearing, toxic-appearing or diaphoretic.  HENT:     Head: Normocephalic and atraumatic.   Eyes:     Extraocular Movements: Extraocular movements intact.     Conjunctiva/sclera: Conjunctivae normal.     Pupils: Pupils are equal, round, and reactive to light.  Cardiovascular:     Rate and Rhythm: Normal rate and regular rhythm.     Heart sounds: No murmur heard. Pulmonary:     Effort: Pulmonary effort is normal. No respiratory distress.     Breath sounds: Rhonchi and rales present.  Chest:     Chest wall: No tenderness.  Abdominal:     Palpations: Abdomen is soft.     Tenderness: There is no abdominal tenderness.  Musculoskeletal:        General: No swelling.  Cervical back: Neck supple.     Right lower leg: No edema.     Left lower leg: No edema.  Skin:    General: Skin is warm and dry.     Capillary Refill: Capillary refill takes less than 2 seconds.     Findings: Rash present.  Neurological:     General: No focal deficit present.     Mental Status: He is alert.  Psychiatric:        Mood and Affect: Mood normal.         ED Results / Procedures / Treatments   Labs (all labs ordered are listed, but only abnormal results are displayed) Labs Reviewed  BASIC METABOLIC PANEL WITH GFR - Abnormal; Notable for the following components:      Result Value    Glucose, Bld 117 (*)    Creatinine, Ser 1.41 (*)    Calcium  8.8 (*)    GFR, Estimated 53 (*)    All other components within normal limits  CBC - Abnormal; Notable for the following components:   MCV 107.6 (*)    MCH 35.9 (*)    All other components within normal limits  BRAIN NATRIURETIC PEPTIDE - Abnormal; Notable for the following components:   B Natriuretic Peptide 346.6 (*)    All other components within normal limits  TROPONIN I (HIGH SENSITIVITY) - Abnormal; Notable for the following components:   Troponin I (High Sensitivity) 23 (*)    All other components within normal limits  HEPARIN  LEVEL (UNFRACTIONATED)  HEMOGLOBIN A1C  TROPONIN I (HIGH SENSITIVITY)    EKG EKG Interpretation Date/Time:  Saturday Apr 12 2024 08:20:53 EDT Ventricular Rate:  82 PR Interval:  134 QRS Duration:  146 QT Interval:  400 QTC Calculation: 468 R Axis:   -55  Text Interpretation: Sinus rhythm Left bundle branch block when compared to prior, similar appearance No STEMI Confirmed by Wynell Heath ([id number]) on 04/12/2024 8:37:33 AM  Radiology CT Angio Chest PE W and/or Wo Contrast Result Date: 04/12/2024 CLINICAL DATA:  Dyspnea on exertion. EXAM: CT ANGIOGRAPHY CHEST WITH CONTRAST TECHNIQUE: Multidetector CT imaging of the chest was performed using the standard protocol during bolus administration of intravenous contrast. Multiplanar CT image reconstructions and MIPs were obtained to evaluate the vascular anatomy. RADIATION DOSE REDUCTION: This exam was performed according to the departmental dose-optimization program which includes automated exposure control, adjustment of the mA and/or kV according to patient size and/or use of iterative reconstruction technique. CONTRAST:  75mL OMNIPAQUE  IOHEXOL  350 MG/ML SOLN COMPARISON:  September 24, 2022. FINDINGS: Cardiovascular: Satisfactory opacification of the pulmonary arteries to the segmental level. No evidence of pulmonary embolism. Normal heart size. No  pericardial effusion. Coronary artery calcifications are noted. Mediastinum/Nodes: No enlarged mediastinal, hilar, or axillary lymph nodes. Thyroid  gland, trachea, and esophagus demonstrate no significant findings. Lungs/Pleura: Large left pleural effusion is noted with mild left basilar subsegmental atelectasis. Minimal right pleural effusion is noted with minimal adjacent subsegmental atelectasis. Emphysematous disease is noted bilaterally. No pneumothorax. Upper Abdomen: No acute abnormality. Musculoskeletal: No chest wall abnormality. No acute or significant osseous findings. Review of the MIP images confirms the above findings. IMPRESSION: No definite evidence of pulmonary embolus. Bilateral pleural effusions are noted with adjacent subsegmental atelectasis, left greater than right. Coronary artery calcifications are noted suggesting coronary artery disease. Aortic Atherosclerosis (ICD10-I70.0) and Emphysema (ICD10-J43.9). Electronically Signed   By: Rosalene Colon M.D.   On: 04/12/2024 12:49   DG Chest 2 View Result Date: 04/12/2024  CLINICAL DATA:  Shortness of breath and chest pain. EXAM: CHEST - 2 VIEW COMPARISON:  09/24/2022 FINDINGS: Left base collapse/consolidation with moderate left pleural effusion, new findings in the interval. Right lung clear. Heart size upper normal to mildly increased. No acute bony abnormality. IMPRESSION: Left base collapse/consolidation with moderate left pleural effusion. Given the unilateral disease, close follow-up recommended to exclude underlying neoplasm. CT chest with contrast may prove helpful to further evaluate as clinically warranted. Electronically Signed   By: Donnal Fusi M.D.   On: 04/12/2024 08:58    Procedures Procedures    CRITICAL CARE Performed by: Marine Sia Mechelle Pates Total critical care time: 25 minutes Critical care time was exclusive of separately billable procedures and treating other patients. Critical care was necessary to treat or  prevent imminent or life-threatening deterioration. Critical care was time spent personally by me on the following activities: development of treatment plan with patient and/or surrogate as well as nursing, discussions with consultants, evaluation of patient's response to treatment, examination of patient, obtaining history from patient or surrogate, ordering and performing treatments and interventions, ordering and review of laboratory studies, ordering and review of radiographic studies, pulse oximetry and re-evaluation of patient's condition.  Medications Ordered in ED Medications  heparin  ADULT infusion 100 units/mL (25000 units/250mL) (1,250 Units/hr Intravenous New Bag/Given 04/12/24 1537)  sodium chloride  flush (NS) 0.9 % injection 3 mL (has no administration in time range)  acetaminophen  (TYLENOL ) tablet 650 mg (has no administration in time range)    Or  acetaminophen  (TYLENOL ) suppository 650 mg (has no administration in time range)  senna-docusate (Senokot-S) tablet 1 tablet (has no administration in time range)  albuterol  (PROVENTIL ) (2.5 MG/3ML) 0.083% nebulizer solution 2.5 mg (has no administration in time range)  nicotine  (NICODERM CQ  - dosed in mg/24 hours) patch 14 mg (has no administration in time range)  iohexol  (OMNIPAQUE ) 350 MG/ML injection 75 mL (75 mLs Intravenous Contrast Given 04/12/24 1237)  heparin  bolus via infusion 4,500 Units (4,500 Units Intravenous Bolus from Bag 04/12/24 1538)    ED Course/ Medical Decision Making/ A&P                                 Medical Decision Making Amount and/or Complexity of Data Reviewed Labs: ordered. Radiology: ordered.  Risk Prescription drug management.    Zakkery Dorian is a 73 y.o. male with a past medical history significant for hyperlipidemia, CAD with previous MI and PCI, CHF, hard of hearing, and previous tobacco abuse who presents with shortness of breath and problem with his ear.  He reports that for the last  several days he has had shortness of breath with exertion and with laying flat.  He has no history of this.  He reports no chest pain or discomfort.  He reports no nausea, vomiting, constipation, diarrhea, or urinary changes.  He does report for some time he is had some left leg swelling.  He was told he had an "blockage" in it but he does not member was a DVT.  He was on blood thinners after his stent briefly but is not on them for the last few years.  He denies any trauma.  Denies any fevers, chills, or cough and denies any other infectious symptoms.  Also, patient is concerned about his right ear.  He says that he has had a place he was picking at and scratching for the last year or so but has not  seen a physician for it.  It is dark and irregular appearing.  He is denying any neurologic complaints and denies any headaches or ear pain.  Patient reports that numerous other members have had cancer in their chest and he is concerned about that.  He denies any recent weight loss but does report some fatigue.  On exam, lungs have some coarseness and some faint rales.  Chest is nontender.  No murmur.  Abdomen nontender.  Patient has intact sensation and strength in extremities and I did feel palpable pulses in extremities.  Left leg was not significantly edematous on my exam but patient does say that it feels slightly swollen.  Will get DVT ultrasound of the leg given his concern for the swelling.  For his shortness of breath and concern about fluid overload versus pneumonia versus fluid versus blood clot given my also concern for some sort of melanoma or skin cancer on his ear.  We will get chest x-ray initially look for pneumothorax but anticipate getting a CT PE study to further evaluate given my thromboembolic concern.  Will get the ultrasound of the leg.  Will get screening labs.  Anticipate reassessment after workup to determine disposition but will likely also touch base with ENT given the concern  for possible ear neoplasm to determine if this needs further workup now or more of an outpatient workup.  3:06 PM Workup is returned with multiple concerning findings.  Patient's ultrasound was found to be positive for DVT in the left leg per the ultrasound technician.  Final result has not completed but she did see DVT.  Patient's chest x-ray showed concern for asymmetric effusion that could be concerning for malignancy or infection.  Patient's CT scan was also completed that did not show pulmonary embolism but did show the large pleural effusion and fluid.  Patient does have evidence of acute kidney injury with creatinine of 1.41 up from prior and his BNP is now elevated at 346.  Suspect some degree of heart failure.  Troponin is slightly elevated but not critically so at 23.  CBC reassuring.  Given the patient's new shortness of breath, new pleural effusion, elevated BNP, AKI, and new DVT, I am concerned about the patient going home.  Will order heparin  for the clot, discussed with medicine about safe diuresis in the setting of AKI, and we will call ENT given my concern for some form of cancer on the ear that may just need outpatient evaluation.   Will call for medical admit.  3:14 PM Spoke with ENT on consult who feels that this is appropriate for outpatient either dermatology or other ENT follow-up for biopsy and management.  Will await medical admission callback.        Final Clinical Impression(s) / ED Diagnoses Final diagnoses:  Acute deep vein thrombosis (DVT) of proximal vein of left lower extremity (HCC)  Pleural effusion  AKI (acute kidney injury) (HCC)  Orthopnea  Shortness of breath    Clinical Impression: 1. Acute deep vein thrombosis (DVT) of proximal vein of left lower extremity (HCC)   2. Pleural effusion   3. AKI (acute kidney injury) (HCC)   4. Orthopnea   5. Shortness of breath     Disposition: Admit  This note was prepared with assistance of Dragon voice  recognition software. Occasional wrong-word or sound-a-like substitutions may have occurred due to the inherent limitations of voice recognition software.      Chaneka Trefz, Marine Sia, MD 04/12/24 1622

## 2024-04-13 ENCOUNTER — Observation Stay (HOSPITAL_COMMUNITY)

## 2024-04-13 DIAGNOSIS — J90 Pleural effusion, not elsewhere classified: Secondary | ICD-10-CM | POA: Diagnosis not present

## 2024-04-13 DIAGNOSIS — I2510 Atherosclerotic heart disease of native coronary artery without angina pectoris: Secondary | ICD-10-CM

## 2024-04-13 DIAGNOSIS — R0609 Other forms of dyspnea: Secondary | ICD-10-CM | POA: Diagnosis not present

## 2024-04-13 LAB — COMPREHENSIVE METABOLIC PANEL WITH GFR
ALT: 12 U/L (ref 0–44)
AST: 15 U/L (ref 15–41)
Albumin: 3.3 g/dL — ABNORMAL LOW (ref 3.5–5.0)
Alkaline Phosphatase: 57 U/L (ref 38–126)
Anion gap: 8 (ref 5–15)
BUN: 13 mg/dL (ref 8–23)
CO2: 28 mmol/L (ref 22–32)
Calcium: 8.6 mg/dL — ABNORMAL LOW (ref 8.9–10.3)
Chloride: 102 mmol/L (ref 98–111)
Creatinine, Ser: 1.48 mg/dL — ABNORMAL HIGH (ref 0.61–1.24)
GFR, Estimated: 50 mL/min — ABNORMAL LOW (ref >60)
Glucose, Bld: 99 mg/dL (ref 70–99)
Potassium: 4.1 mmol/L (ref 3.5–5.1)
Sodium: 138 mmol/L (ref 135–145)
Total Bilirubin: 1.2 mg/dL (ref 0.0–1.2)
Total Protein: 6 g/dL — ABNORMAL LOW (ref 6.5–8.1)

## 2024-04-13 LAB — CBC
HCT: 42.2 % (ref 39.0–52.0)
Hemoglobin: 14.8 g/dL (ref 13.0–17.0)
MCH: 35.7 pg — ABNORMAL HIGH (ref 26.0–34.0)
MCHC: 35.1 g/dL (ref 30.0–36.0)
MCV: 101.9 fL — ABNORMAL HIGH (ref 80.0–100.0)
Platelets: 187 10*3/uL (ref 150–400)
RBC: 4.14 MIL/uL — ABNORMAL LOW (ref 4.22–5.81)
RDW: 13.6 % (ref 11.5–15.5)
WBC: 7.5 10*3/uL (ref 4.0–10.5)
nRBC: 0 % (ref 0.0–0.2)

## 2024-04-13 LAB — TROPONIN I (HIGH SENSITIVITY): Troponin I (High Sensitivity): 44 ng/L — ABNORMAL HIGH (ref <18)

## 2024-04-13 LAB — ECHOCARDIOGRAM COMPLETE
Height: 71 in
S' Lateral: 5.9 cm
Weight: 2800.72 [oz_av]

## 2024-04-13 LAB — FOLATE: Folate: 5.9 ng/mL — ABNORMAL LOW (ref >5.9)

## 2024-04-13 LAB — HEPARIN LEVEL (UNFRACTIONATED): Heparin Unfractionated: 0.4 [IU]/mL (ref 0.30–0.70)

## 2024-04-13 SURGERY — THORACENTESIS
Laterality: Left

## 2024-04-13 MED ORDER — FUROSEMIDE 20 MG PO TABS: 20.0000 mg | ORAL_TABLET | Freq: Every day | ORAL | 0 refills | Status: AC | PRN

## 2024-04-13 MED ORDER — RIVAROXABAN 20 MG PO TABS: 20.0000 mg | ORAL_TABLET | Freq: Every day | ORAL | 1 refills | Status: AC

## 2024-04-13 MED ORDER — RIVAROXABAN (XARELTO) VTE STARTER PACK (15 & 20 MG): ORAL_TABLET | ORAL | 0 refills | Status: AC

## 2024-04-13 MED ORDER — RIVAROXABAN 15 MG PO TABS
15.0000 mg | ORAL_TABLET | Freq: Two times a day (BID) | ORAL | Status: DC
  Administered 2024-04-13: 15 mg via ORAL
  Filled 2024-04-13: qty 1

## 2024-04-13 MED ORDER — RIVAROXABAN 15 MG PO TABS: 15.0000 mg | ORAL_TABLET | Freq: Two times a day (BID) | ORAL | Status: DC

## 2024-04-13 MED ORDER — RIVAROXABAN 20 MG PO TABS: 20.0000 mg | ORAL_TABLET | Freq: Every day | ORAL | Status: DC

## 2024-04-13 MED ORDER — HEPARIN BOLUS VIA INFUSION
2000.0000 [IU] | Freq: Once | INTRAVENOUS | Status: AC
  Administered 2024-04-13: 2000 [IU] via INTRAVENOUS
  Filled 2024-04-13: qty 2000

## 2024-04-13 MED ORDER — PERFLUTREN LIPID MICROSPHERE
1.0000 mL | INTRAVENOUS | Status: AC | PRN
  Administered 2024-04-13: 3 mL via INTRAVENOUS

## 2024-04-13 NOTE — Progress Notes (Signed)
 Met with pt to discuss PCP. Pt reports that he is trying to find a new PCP, because his PCP office is too far for him. He reports that he contacted [facility] and they provided a phone # for him to call to find a PCP. He reports he can't remember the name of the clinic, but he has the phone number. He reports that he will call tomorrow to arrange a f/u appointment. Discussed the importance of following with a doctor after DC. Encouraged pt to contact the clinica tomorrow morning. He verbalized understanding. Pt reports that he lives with a roommate. He doesn't drive. He doesn't use any DME. He reports that he calls [facility] for assistance with transportation when he has a doctor appointment or he needs to go to the pharmacy. He reports that he needs a taxi today to go home. Dr. Garald Jumbo is concern that pt needs his Xarelto prescription today. Provided pt with two taxi vouchers to transport pt to [facility] and home. Explained to pt that he can call [facility] after he gets his meds and the phone is on the voucher. Xarelto free 30-day card provided to pt.

## 2024-04-13 NOTE — Progress Notes (Signed)
 PT Cancellation Note  Patient Details Name: Casey Jackson MRN: [id number] DOB: [birth year]   Cancelled Treatment:    Reason Eval/Treat Not Completed: Medical issues which prohibited therapy. Pt with acute DVT with anti-coagulation started on 5/17 at 15:37. Acute PT to hold per department protocol. Acute PT to follow and re-attempt as appropriate.  Orysia Blas, PT, DPT Secure Chat Preferred  Rehab Office [phone]  Alissa April Adela Ades 04/13/2024, 8:11 AM

## 2024-04-13 NOTE — Progress Notes (Signed)
 OT Cancellation Note  Patient Details Name: Casey Jackson MRN: [id number] DOB: 1951-05-17   Cancelled Treatment:    Reason Eval/Treat Not Completed: Patient not medically ready (per protocol must be on heparin  for 24 hours prior to initiating evaluation) OT will continue to follow for evaluation. Heparin  started 04/12/24 at 1537.  Ebony Goldstein Hisayo Delossantos 04/13/2024, 8:11 AM  [place] OTR/L Acute Rehabilitation Services Office: [phone]

## 2024-04-13 NOTE — Progress Notes (Signed)
 Notified by primary team that patient wants to be DNR and does not want thoracentesis. Will hold procedures. And sign off. Please call if anything changes.   Louie Rover, MD Pulmonary and Critical Care Medicine [facility] 04/13/2024 10:10 AM Pager: see AMION  If no response to pager, please call critical care on call (see AMION) until 7pm After 7:00 pm call Elink

## 2024-04-13 NOTE — Discharge Summary (Signed)
 Name: Casey Jackson MRN: [id number] DOB: 12-19-1950 73 y.o. PCP: Patient, No Pcp Per  Date of Admission: 04/12/2024  8:07 AM Date of Discharge: 04/13/2024 12:43 PM Attending Physician: Dr. Alwin Baars  Discharge Diagnosis: Principal Problem:   Pleural effusion, bilateral Active Problems:   Cardiomyopathy, ischemic   Coronary artery disease   Tobacco abuse   HFrEF (heart failure with reduced ejection fraction) (HCC)    Discharge Medications: Allergies as of 04/13/2024       Reactions   Penicillins Swelling, Other (See Comments)   Has patient had a PCN reaction causing immediate rash, facial/tongue/throat swelling, SOB or lightheadedness with hypotension: YES Has patient had a PCN reaction causing severe rash involving mucus membranes or skin necrosis: YES Has patient had a PCN reaction that required hospitalization NO, REACTION OCCURRED IN MD OFFICE Has patient had a PCN reaction occurring within the last 10 years: NO 1968 If all of the above answers are "NO", then may proceed with Cephalosporin use.   Simvastatin Other (See Comments)   Muscle cramps   Ergocalciferol Palpitations        Medication List     TAKE these medications    acetaminophen  500 MG tablet Commonly known as: TYLENOL  Take 1 tablet (500 mg total) by mouth every 6 (six) hours as needed.   aspirin  81 MG tablet Take 81 mg by mouth at bedtime.   atorvastatin  20 MG tablet Commonly known as: LIPITOR  Take 1 tablet (20 mg total) by mouth daily.   furosemide  20 MG tablet Commonly known as: Lasix  Take 1 tablet (20 mg total) by mouth daily as needed. Leg swelling or shortness of breath   mupirocin  ointment 2 % Commonly known as: BACTROBAN  Apply topically 2 (two) times daily.   Rivaroxaban Starter Pack (15 mg and 20 mg) Commonly known as: XARELTO STARTER PACK Follow package directions: Take one 15mg  tablet by mouth twice a day. On day 22, switch to one 20mg  tablet once a day. Take with food.    rivaroxaban 20 MG Tabs tablet Commonly known as: XARELTO Take 1 tablet (20 mg total) by mouth daily with supper. Start taking on: May 13, 2024 Notes to patient: Do not start until after Xarelto Starter Pack completed - provided hard copy prescription at discharge if needed prior to outpatient provider sending to mail order pharmacy        Disposition and follow-up:   Casey Jackson was discharged from [facility] in [place] condition.  At the hospital follow up visit please address:  1.  Follow-up:  *Large L pleural effusion *Concern for malignant effusion - Patient declined diagnostic and therapeutic thoracentesis - Discharged on RA - High risk for decompensation and death  *LLE DVT - Discharged on Xarelto starter pack - 2x hard copy prescriptions with xarelto for 2 months - Will likely need chronic anticoagulation; refill as appropriate  *Acute HF Exacerbation LVEF 25-30% *Bilateral large L pleural effusion on CT *Severe 3 vessel CAD s/p PCI of RCA in 2016 - Will need to follow up with [facility]; will be called for appointment - has not tolerated GDMT in the past - Furosemide  PRN for LE edema and SOB - Continued on ASA and Lipitor  20 mg   *75 pack year tobacco use - Counseled cessation  *Right pinna ulceration - Concerning for malignancy - Will need ENT or dermatology follow up  *Hearing impairment *No PCP *SDOH; unable to drive - Ernestina Headland, roommate, helps patient with appointments - Received medications via mail delivery -  Social work consulted; will establish care at [facility]. Given Internal Medicine Clinic number if unable to establish with OSH. - Taxi vouchers at discharge   2.  Labs / imaging needed at time of follow-up: CBC, CMP, carotid and abdominal AAA ultrasound  3.  Pending labs/ test needing follow-up: 2D echocardiogram final read  Follow-up Appointments: Patient and roommate instructed to follow up with PCP  Hospital  Course by problem list:  Dyspnea HF Exacerbation (EF 20-25% 05/11/2025) Bilateral large L pleural effusion on CT Severe 3 vessel CAD s/p PCI of RCA in 2016 Initially treated for acute HFrEF exacerbation given signs of congestion on exam. BNP of 346.  CXR showing significant left-sided and moderate right-sided pleural effusion with possible LLL loculation. Etiology for this suspected to be malignancy given patient's risk factors, though known chronic severe systolic dysfunction contributory. Last echo in 2016 with EF 20-25% and severe diffuse hypokinesis. ACS was ruled out this admission. PCCM was consulted for diagnostic and therapeutic thoracenthesis on admission. After further consideration and ~20 mins of in-depth discussion about risks of forgoing fluid removal, including progression of  respiratory distress and cardiac arrest, patient made decision to cancel PCCM consult. Patient was discharged on RA with >95% O2 saturations and given PO lasix  for PRN leg edema or worsening shortness of breath. Discussed his case with on-call [facility] physician, who will set up appointement in their clinic in the next week. Phone call to follow up with scheduled appointment given on AVS.  LLE DVT Doppler US  with DVT in LLE. Risk factors include heavy tobacco use for 50+ years. No colonoscopy on file. No B symptoms or weight loss. CT PE study without PE. Preliminary read on TTE on HOD2 reassuring. Now with L pleural effusion concerning for malignant effusion. Given this, patient will likely require chronic anticoagulation. He was transitioned to Xarelto. [facility] and hard copy scripts for two additional months of therapy provided at discharge. He is yet to establish with PCP; appreciate social work support to guide patient. He wishes to establish at [facility]. [facility] clinic number provided as back up.  Bilateral neck distention/pulsation - Bilateral carotid artery US  ordered; however,  patient wished to leave hospital before test was performed. This will need outpatient follow up.    ?AKI vs CKD Cr 1.41, GFR 53. Normal U/A. Given history of HFrEF and congestion on examination, suspect cardiorenal etiology. Baseline as of one year ago ~ Cr. 1 - 1.3. At discharge, his Cr was unchanged after IV Lasix  40 mg trial. Net neg 1L. Will need monitoring at follow up  Severe 3 vessel CAD s/p PCI of RCA in 2016 Ischemic cardiomyopathy HLDHTN Previously a patient of Dr. Christophe Cram at [facility]. Has non-specific allergy to Simvastatin. Had been considered for CABG by thoracic surgery in 2016 after MI but patient declined. He also declined defibrillator placement. Lost to follow up since with multiple Ed visits.Has not tolerated BB or ACEi in the past per Cardiology notes in 2016-2021. Has not tolerated many statins in the past. Adherent to ASA 81 mg and Atorvastatin  20 mg daily. Continues to decline further interventions. Will be discharged on PTA medications.   Tobacco use 75-pack-year history.  - Nicotine  patch ordered - Will need AAA screening   Right pinna ulceration Concerning for squamous or basal cell carcinoma. ED provider contacted ENT who recommended outpatient follow-up.   Code Status GOC conversation prior to discharge. Patient was clear in his decision to  not pursue invasive interventions at this time. He demonstrated understanding of the risks of doing so, including death. During rounds, he also changed his code status to DNR and DNI, full code did not align with his goals of not pursuing invasive interventions.  Discharge Subjective: Improvement in shortness of breath after lasix  dosing. No chest pain, nausea, or vomiting.  Discharge Exam:   Blood pressure 117/69, pulse 75, temperature 97.8 F (36.6 C), temperature source Oral, resp. rate 17, height 5\' 11"  (1.803 m), weight 79.4 kg, SpO2 91%.  Constitutional:elderly man IN AND HEENT: normocephalic atraumatic, mucous  membranes moist. Hearing impairment. R pinna ulceration with eschar. No bleeding. Cardiovascular: regular rate and rhythm, no m/r/g. + JVD Pulmonary/Chest: normal work of breathing on room air, decreased breath sounds on L>R. Abdominal: soft, non-tender, non-distended Neurological: alert & oriented x 3 MSK: no gross abnormalities. Trace pitting edema Skin: warm and dry Psych: Normal mood and affect  Pertinent Labs, Studies, and Procedures:     Latest Ref Rng & Units 04/13/2024    5:07 AM 04/12/2024    8:20 AM 09/24/2022    7:17 PM  CBC  WBC 4.0 - 10.5 K/uL 7.5  6.8  7.1   Hemoglobin 13.0 - 17.0 g/dL 40.9  81.1  91.4   Hematocrit 39.0 - 52.0 % 42.2  45.5  40.4   Platelets 150 - 400 K/uL 187  193  191        Latest Ref Rng & Units 04/13/2024    5:07 AM 04/12/2024    8:20 AM 09/24/2022    9:50 PM  CMP  Glucose 70 - 99 mg/dL 99  782  93   BUN 8 - 23 mg/dL 13  12  6    Creatinine 0.61 - 1.24 mg/dL 9.56  2.13  0.86   Sodium 135 - 145 mmol/L 138  137  139   Potassium 3.5 - 5.1 mmol/L 4.1  4.0  3.7   Chloride 98 - 111 mmol/L 102  99  103   CO2 22 - 32 mmol/L 28  24  27    Calcium  8.9 - 10.3 mg/dL 8.6  8.8  8.5   Total Protein 6.5 - 8.1 g/dL 6.0     Total Bilirubin 0.0 - 1.2 mg/dL 1.2     Alkaline Phos 38 - 126 U/L 57     AST 15 - 41 U/L 15     ALT 0 - 44 U/L 12       VAS US  LOWER EXTREMITY VENOUS (DVT) (ONLY [facility] & [facility]) Result Date: 04/13/2024  Lower Venous DVT Study Patient Name:  BRADDEN TADROS  Date of Exam:   04/12/2024 Medical Rec #: [id number]      Accession #:    [id number] Date of Birth: [birth year]      Patient Gender: M Patient Age:   51 years Exam Location:  [facility] Procedure:      VAS US  LOWER EXTREMITY VENOUS (DVT) Referring Phys: Paris Bolds --------------------------------------------------------------------------------  Indications: Edema.  Comparison Study: No prior study on file Performing Technologist: Carleene Chase RVS  Examination Guidelines: A complete  evaluation includes B-mode imaging, spectral Doppler, color Doppler, and power Doppler as needed of all accessible portions of each vessel. Bilateral testing is considered an integral part of a complete examination. Limited examinations for reoccurring indications may be performed as noted. The reflux portion of the exam is performed with the patient in reverse Trendelenburg.  +-----+---------------+---------+-----------+----------+--------------+ RIGHTCompressibilityPhasicitySpontaneityPropertiesThrombus Aging +-----+---------------+---------+-----------+----------+--------------+ CFV  Full  Yes      No                                  +-----+---------------+---------+-----------+----------+--------------+ SFJ  Full                                                        +-----+---------------+---------+-----------+----------+--------------+   +---------+---------------+---------+-----------+----------+------------------+ LEFT     CompressibilityPhasicitySpontaneityPropertiesThrombus Aging     +---------+---------------+---------+-----------+----------+------------------+ CFV      Full           Yes      Yes                                     +---------+---------------+---------+-----------+----------+------------------+ SFJ      Full                                                            +---------+---------------+---------+-----------+----------+------------------+ FV Prox  Full                                                            +---------+---------------+---------+-----------+----------+------------------+ FV Mid   Full                                                            +---------+---------------+---------+-----------+----------+------------------+ FV DistalFull                                                            +---------+---------------+---------+-----------+----------+------------------+ PFV      Full                                                             +---------+---------------+---------+-----------+----------+------------------+ POP      Full           Yes      Yes                                     +---------+---------------+---------+-----------+----------+------------------+ PTV      None  Acute              +---------+---------------+---------+-----------+----------+------------------+ PERO     None                                         Acute proximal                                                           portion            +---------+---------------+---------+-----------+----------+------------------+ Gastroc  Full                                                            +---------+---------------+---------+-----------+----------+------------------+ TP trunk None                                         Acute              +---------+---------------+---------+-----------+----------+------------------+     Summary: RIGHT: - No evidence of common femoral vein obstruction.   LEFT: - Findings consistent with acute deep vein thrombosis involving the left posterior tibial veins, left peroneal veins, and Tibioperoneal trunk.  - No cystic structure found in the popliteal fossa.  *See table(s) above for measurements and observations. Electronically signed by Runell Countryman on 04/13/2024 at 9:38:13 AM.    Final    CT Angio Chest PE W and/or Wo Contrast Result Date: 04/12/2024 CLINICAL DATA:  Dyspnea on exertion. EXAM: CT ANGIOGRAPHY CHEST WITH CONTRAST TECHNIQUE: Multidetector CT imaging of the chest was performed using the standard protocol during bolus administration of intravenous contrast. Multiplanar CT image reconstructions and MIPs were obtained to evaluate the vascular anatomy. RADIATION DOSE REDUCTION: This exam was performed according to the departmental dose-optimization program which includes automated exposure  control, adjustment of the mA and/or kV according to patient size and/or use of iterative reconstruction technique. CONTRAST:  75mL OMNIPAQUE  IOHEXOL  350 MG/ML SOLN COMPARISON:  September 24, 2022. FINDINGS: Cardiovascular: Satisfactory opacification of the pulmonary arteries to the segmental level. No evidence of pulmonary embolism. Normal heart size. No pericardial effusion. Coronary artery calcifications are noted. Mediastinum/Nodes: No enlarged mediastinal, hilar, or axillary lymph nodes. Thyroid  gland, trachea, and esophagus demonstrate no significant findings. Lungs/Pleura: Large left pleural effusion is noted with mild left basilar subsegmental atelectasis. Minimal right pleural effusion is noted with minimal adjacent subsegmental atelectasis. Emphysematous disease is noted bilaterally. No pneumothorax. Upper Abdomen: No acute abnormality. Musculoskeletal: No chest wall abnormality. No acute or significant osseous findings. Review of the MIP images confirms the above findings. IMPRESSION: No definite evidence of pulmonary embolus. Bilateral pleural effusions are noted with adjacent subsegmental atelectasis, left greater than right. Coronary artery calcifications are noted suggesting coronary artery disease. Aortic Atherosclerosis (ICD10-I70.0) and Emphysema (ICD10-J43.9). Electronically Signed   By: Rosalene Colon M.D.   On: 04/12/2024 12:49   DG Chest 2 View Result Date: 04/12/2024 CLINICAL DATA:  Shortness of breath and chest pain.  EXAM: CHEST - 2 VIEW COMPARISON:  09/24/2022 FINDINGS: Left base collapse/consolidation with moderate left pleural effusion, new findings in the interval. Right lung clear. Heart size upper normal to mildly increased. No acute bony abnormality. IMPRESSION: Left base collapse/consolidation with moderate left pleural effusion. Given the unilateral disease, close follow-up recommended to exclude underlying neoplasm. CT chest with contrast may prove helpful to further evaluate as  clinically warranted. Electronically Signed   By: Donnal Fusi M.D.   On: 04/12/2024 08:58     Discharge Instructions: Discharge Instructions     (HEART FAILURE PATIENTS) Call MD:  Anytime you have any of the following symptoms: 1) 3 pound weight gain in 24 hours or 5 pounds in 1 week 2) shortness of breath, with or without a dry hacking cough 3) swelling in the hands, feet or stomach 4) if you have to sleep on extra pillows at night in order to breathe.   Complete by: As directed    Call MD for:  difficulty breathing, headache or visual disturbances   Complete by: As directed    Call MD for:  extreme fatigue   Complete by: As directed    Call MD for:  persistant dizziness or light-headedness   Complete by: As directed    Call MD for:  redness, tenderness, or signs of infection (pain, swelling, redness, odor or green/yellow discharge around incision site)   Complete by: As directed    Call MD for:  temperature >100.4   Complete by: As directed    Diet - low sodium heart healthy   Complete by: As directed    Discharge instructions   Complete by: As directed    Mr. Manasco -  You were admitted to the hospital because of shortness of breath. During your evaluation, we found you have a blood clot in your L leg and fluid accumulation around your L lung. The causes of these two conditions is still unclear, but we are concerned about cancer.  You were taking Aspirin  before you got to the hospital. You will be leaving with another blood thinner called Xarelto. This is to continue to treat the blood clot in your leg.  While you were hospitalized, we discussed the need for taking a sample of the fluid of the L lung. Besides not knowing if this is a sign of cancer, it is likely this fluid will continue to accumulate. During this admission you made the decision to not undergo this procedure. You also decided you did not want resuscitation or intubation in the event your heart stops.   You are  being discharged from the hospital now. Please follow the instructions below:  1. [facility] [address]: - Xarelto. One 15 mg tablet in the AM and in the PM for 21 days - On day 22, start taking one 20 mg tablet daily - Furosemide  - take one tablet as needed for leg swelling or difficulty breathing.   2. Continue taking - Aspirin  81 mg daily - Atorvastatin  20 mg daily  3. Call [facility] to make an appointment in the next 1-2 weeks  4. Moving forward, you will need to be monitored as the medications you are on could cause bleeding. You will need to take Xarelto for life. Make sure you do not run out of this medication. You will leave the hospital with 2 hard copy prescriptions just in case there are problems getting an appointment at [facility]. If they cannot see you  in the next 2 weeks, please call [phone] - this is the resident clinic at [facility]. We can also take care of you.  If you experience chest pain, shortness of breath, change your mind about wanting the fluid of your lung removed - please let your primary care doctor know.  You got an heart ultrasound while you were in this hospital that won't be finalized by the time you leave. It is likely to show very low pumping function of your heart as we talked about.  Show this instructions to your new doctor so they can follow up on it. It will be important for you to follow up with the heart doctor, Dr. Peter Swaziland at  [phone].  You will have a voucher to go to [address], [place]  [facility] Once there, call [employer] taxi drivers (Someone at the pharmacy may help you. OR, Ernestina Headland (your roommate) may help, I called him. You can also call your friend who drives for them) that way you can get home.  Take care,  Your Internal Medicine team at [facility]   Increase activity slowly   Complete by: As directed    No wound care   Complete by: As  directed        Signed: Cathey Clunes, MD [facility] 04/13/2024, 12:43 PM

## 2024-04-13 NOTE — Progress Notes (Signed)
 PHARMACY - ANTICOAGULATION CONSULT NOTE  Pharmacy Consult for heparin    Indication: DVT  Allergies  Allergen Reactions   Penicillins Swelling and Other (See Comments)    Has patient had a PCN reaction causing immediate rash, facial/tongue/throat swelling, SOB or lightheadedness with hypotension: YES Has patient had a PCN reaction causing severe rash involving mucus membranes or skin necrosis: YES Has patient had a PCN reaction that required hospitalization NO, REACTION OCCURRED IN MD OFFICE Has patient had a PCN reaction occurring within the last 10 years: NO 1968 If all of the above answers are "NO", then may proceed with Cephalosporin use.    Simvastatin Other (See Comments)    Muscle cramps   Ergocalciferol Palpitations    Patient Measurements: Height: 5\' 11"  (180.3 cm) Weight: 79.4 kg (175 lb 0.7 oz) IBW/kg (Calculated) : 75.3 HEPARIN  DW (KG): 79.4 Heparin  dosing weight: 79.4kg   Vital Signs: Temp: 98.3 F (36.8 C) (05/18 0017) Temp Source: Oral (05/18 0017) BP: 92/37 (05/18 0017) Pulse Rate: 70 (05/18 0017)  Labs: Recent Labs    04/12/24 0820 04/12/24 1853 04/12/24 2308  HGB 15.2  --   --   HCT 45.5  --   --   PLT 193  --   --   HEPARINUNFRC  --   --  0.16*  CREATININE 1.41*  --   --   TROPONINIHS 23* 42* 44*    Estimated Creatinine Clearance: 51.2 mL/min (A) (by C-G formula based on SCr of 1.41 mg/dL (H)).   Medical History: Past Medical History:  Diagnosis Date   Coronary artery disease    a. Big NSTEMI 11/2014: cath with surgical disease but patient initially refused CABG. Compromise decision was made to do intervention on the RCA with a bare metal stent with medical therapy for other disease and follow-up as an outpatient.   HOH (hard of hearing)    Hyperlipidemia    Ischemic cardiomyopathy    a. a. Cath 12/22/14: EF 25%. b. 2D Echo 12/23/14: EF 25-30%, diffuse hypokinesis, akinesis of the entireinferolateral and inferior myocardium, mild MR.    Myocardial infarction [facility])    NSTEMI   Osteoarthritis    Pneumonia    Tobacco abuse    Transaminitis    Assessment: Patient presenting with CC of SOB, CTA negative for PE. Found to have DVT, not on anticoagulation PTA. HgB 15.2 and PLTs 193. Pharmacy consulted to dose heparin  gtt.   5/18 AM update:  Heparin  level sub-therapeutic  Goal of Therapy:  Heparin  level 0.3-0.7 units/ml Monitor platelets by anticoagulation protocol: Yes   Plan:  Heparin  2000 units bolus Inc heparin  to 1350 units/hr Heparin  level in 8 hours  Silvestre Drum, PharmD, BCPS Clinical Pharmacist Phone: [phone]

## 2024-04-13 NOTE — Discharge Instructions (Addendum)
 Casey Jackson -  You were admitted to the hospital because of shortness of breath. During your evaluation, we found you have a blood clot in your L leg and fluid accumulation around your L lung. The causes of these two conditions is still unclear, but we are concerned about cancer.  You were taking Aspirin  before you got to the hospital. You will be leaving with another blood thinner called Xarelto. This is to continue to treat the blood clot in your leg.  While you were hospitalized, we discussed the need for taking a sample of the fluid of the L lung. Besides not knowing if this is a sign of cancer, it is likely this fluid will continue to accumulate. During this admission you made the decision to not undergo this procedure. You also decided you did not want resuscitation or intubation in the event your heart stops.   You are being discharged from the hospital now. Please follow the instructions below:  1. Pick up [facility] at [facility] [address]: - Xarelto. One 15 mg tablet in the AM and in the PM for 21 days - On day 22, start taking one 20 mg tablet daily - Furosemide  - take one tablet as needed for leg swelling or difficulty breathing.   2. Continue taking - Aspirin  81 mg daily - Atorvastatin  20 mg daily  3. Call [facility] to make an appointment in the next 1-2 weeks  4. Moving forward, you will need to be monitored as the medications you are on could cause bleeding. You will need to take Xarelto for life. Make sure you do not run out of this medication. You will leave the hospital with 2 hard copy prescriptions just in case there are problems getting an appointment at [facility]. If they cannot see you in the next 2 weeks, please call [phone] - this is the resident clinic at [facility]. We can also take care of you.  If you experience chest pain, shortness of breath, change your mind about wanting the fluid of your lung removed - please let your primary care  doctor know.  You got an heart ultrasound while you were in this hospital that won't be finalized by the time you leave. It is likely to show very low pumping function of your heart as we talked about.  Show this instructions to your new doctor so they can follow up on it. It will be important for you to follow up with the heart doctor, Dr. Peter Swaziland at  [phone].  You will have a voucher to go to [address], [place]  [facility] Once there, call [employer] taxi drivers (Someone at the pharmacy may help you. OR, Ernestina Headland (your roommate) may help, I called him. You can also call your friend who drives for them) that way you can get home.  Take care,  Your Internal Medicine team at [facility]  Information on my medicine - XARELTO (rivaroxaban)  WHY WAS XARELTO PRESCRIBED FOR YOU? Xarelto was prescribed to treat blood clots that may have been found in the veins of your legs (deep vein thrombosis) or in your lungs (pulmonary embolism) and to reduce the risk of them occurring again.  What do you need to know about Xarelto? The starting dose is one 15 mg tablet taken TWICE daily with food for the FIRST 21 DAYS then on 05/04/2024 the dose is changed to one 20 mg tablet taken ONCE  A DAY with your evening meal.  DO NOT stop taking Xarelto without talking to the health care provider who prescribed the medication.  Refill your prescription for 20 mg tablets before you run out.  After discharge, you should have regular check-up appointments with your healthcare provider that is prescribing your Xarelto.  In the future your dose may need to be changed if your kidney function changes by a significant amount.  What do you do if you miss a dose? If you are taking Xarelto TWICE DAILY and you miss a dose, take it as soon as you remember. You may take two 15 mg tablets (total 30 mg) at the same time then resume your regularly scheduled 15 mg twice daily the next  day.  If you are taking Xarelto ONCE DAILY and you miss a dose, take it as soon as you remember on the same day then continue your regularly scheduled once daily regimen the next day. Do not take two doses of Xarelto at the same time.   Important Safety Information Xarelto is a blood thinner medicine that can cause bleeding. You should call your healthcare provider right away if you experience any of the following: Bleeding from an injury or your nose that does not stop. Unusual colored urine (red or dark brown) or unusual colored stools (red or black). Unusual bruising for unknown reasons. A serious fall or if you hit your head (even if there is no bleeding).  Some medicines may interact with Xarelto and might increase your risk of bleeding while on Xarelto. To help avoid this, consult your healthcare provider or pharmacist prior to using any new prescription or non-prescription medications, including herbals, vitamins, non-steroidal anti-inflammatory drugs (NSAIDs) and supplements.  This website has more information on Xarelto: [email or web address].     Xarelto starter pack will be sent to pharmacy - will need outpatient MD to send refills for maintenance dosing to mail order pharmacy. We will provide hard copy prescriptions for 2 months in case unable to get prescription from mail order in time after starter pack - can bring to pharmacy to get filled.

## 2024-04-13 NOTE — Progress Notes (Signed)
 PHARMACY - ANTICOAGULATION CONSULT NOTE  Pharmacy Consult for heparin    Indication: DVT  Allergies  Allergen Reactions   Penicillins Swelling and Other (See Comments)    Has patient had a PCN reaction causing immediate rash, facial/tongue/throat swelling, SOB or lightheadedness with hypotension: YES Has patient had a PCN reaction causing severe rash involving mucus membranes or skin necrosis: YES Has patient had a PCN reaction that required hospitalization NO, REACTION OCCURRED IN MD OFFICE Has patient had a PCN reaction occurring within the last 10 years: NO 1968 If all of the above answers are "NO", then may proceed with Cephalosporin use.    Simvastatin Other (See Comments)    Muscle cramps   Ergocalciferol Palpitations    Patient Measurements: Height: 5\' 11"  (180.3 cm) Weight: 79.4 kg (175 lb 0.7 oz) IBW/kg (Calculated) : 75.3 HEPARIN  DW (KG): 79.4 Heparin  dosing weight: 79.4kg   Vital Signs: Temp: 97.8 F (36.6 C) (05/18 0801) Temp Source: Oral (05/18 0801) BP: 117/69 (05/18 0801) Pulse Rate: 75 (05/18 0801)  Labs: Recent Labs    04/12/24 0820 04/12/24 1853 04/12/24 2308 04/13/24 0507  HGB 15.2  --   --  14.8  HCT 45.5  --   --  42.2  PLT 193  --   --  187  HEPARINUNFRC  --   --  0.16* 0.40  CREATININE 1.41*  --   --  1.48*  TROPONINIHS 23* 42* 44*  --     Estimated Creatinine Clearance: 48.8 mL/min (A) (by C-G formula based on SCr of 1.48 mg/dL (H)).   Medical History: Past Medical History:  Diagnosis Date   Coronary artery disease    a. Big NSTEMI 11/2014: cath with surgical disease but patient initially refused CABG. Compromise decision was made to do intervention on the RCA with a bare metal stent with medical therapy for other disease and follow-up as an outpatient.   HOH (hard of hearing)    Hyperlipidemia    Ischemic cardiomyopathy    a. a. Cath 12/22/14: EF 25%. b. 2D Echo 12/23/14: EF 25-30%, diffuse hypokinesis, akinesis of the  entireinferolateral and inferior myocardium, mild MR.   Myocardial infarction [facility])    NSTEMI   Osteoarthritis    Pneumonia    Tobacco abuse    Transaminitis    Assessment: Patient presenting with CC of SOB, CTA negative for PE. Found to have DVT, not on anticoagulation PTA. HgB 15.2 and PLTs 193. Pharmacy consulted to dose heparin  gtt.   Heparin  level this morning came back therapeutic at 0.4, on 1350 units/hr. Hgb 14.8, plt 187. No s/sx of bleeding or infusion issues.  Discussed with primary team and plan to change to Xarelto today.   Goal of Therapy:  Heparin  level 0.3-0.7 units/ml Monitor platelets by anticoagulation protocol: Yes   Plan:  Stop heparin  infusion Start Xarelto 15 mg twice daily for 21 days then 20 mg thereafter  Monitor CBC and for s/sx of bleeding   Thank you for allowing pharmacy to participate in this patient's care,  Nieves Bars, PharmD, BCCCP Clinical Pharmacist  Phone: [phone] 04/13/2024 8:44 AM  Please check AMION for all [facility] Pharmacy phone numbers After 10:00 PM, call Main Pharmacy [phone]

## 2024-04-14 ENCOUNTER — Telehealth (HOSPITAL_COMMUNITY): Payer: Self-pay | Admitting: Pharmacy Technician

## 2024-04-14 ENCOUNTER — Other Ambulatory Visit (HOSPITAL_COMMUNITY): Payer: Self-pay

## 2024-04-14 NOTE — Telephone Encounter (Signed)
 Patient Product/process development scientist completed.    The patient is insured through [employer]. Patient has Medicare and is not eligible for a copay card, but may be able to apply for patient assistance or Medicare RX Payment Plan (Patient Must reach out to their plan, if eligible for payment plan), if available.    Ran test claim for Eliquis 5 mg and the current 30 day co-pay is $0.00.   This test claim was processed through [facility]- copay amounts may vary at other pharmacies due to pharmacy/plan contracts, or as the patient moves through the different stages of their insurance plan.     Roland Earl, CPHT Pharmacy Technician III Certified Patient Advocate [facility] Patient Advocate Team Direct Number: [phone]  Fax: [phone]

## 2024-04-20 ENCOUNTER — Emergency Department (HOSPITAL_COMMUNITY)

## 2024-04-20 ENCOUNTER — Other Ambulatory Visit: Payer: Self-pay

## 2024-04-20 ENCOUNTER — Emergency Department (HOSPITAL_COMMUNITY)
Admission: EM | Admit: 2024-04-20 | Discharge: 2024-04-20 | Disposition: A | Discharge status: Home / Self Care | Attending: Emergency Medicine | Admitting: Emergency Medicine

## 2024-04-20 ENCOUNTER — Telehealth: Payer: Self-pay | Admitting: Internal Medicine

## 2024-04-20 DIAGNOSIS — I509 Heart failure, unspecified: Secondary | ICD-10-CM | POA: Diagnosis not present

## 2024-04-20 DIAGNOSIS — I2510 Atherosclerotic heart disease of native coronary artery without angina pectoris: Secondary | ICD-10-CM | POA: Insufficient documentation

## 2024-04-20 DIAGNOSIS — Z7982 Long term (current) use of aspirin: Secondary | ICD-10-CM | POA: Diagnosis not present

## 2024-04-20 DIAGNOSIS — J90 Pleural effusion, not elsewhere classified: Secondary | ICD-10-CM | POA: Insufficient documentation

## 2024-04-20 DIAGNOSIS — Z7901 Long term (current) use of anticoagulants: Secondary | ICD-10-CM | POA: Insufficient documentation

## 2024-04-20 DIAGNOSIS — R0602 Shortness of breath: Secondary | ICD-10-CM | POA: Diagnosis present

## 2024-04-20 LAB — BODY FLUID CELL COUNT WITH DIFFERENTIAL
Eos, Fluid: 2 %
Lymphs, Fluid: 16 %
Monocyte-Macrophage-Serous Fluid: 3 % — ABNORMAL LOW (ref 50–90)
Neutrophil Count, Fluid: 79 % — ABNORMAL HIGH (ref 0–25)
Total Nucleated Cell Count, Fluid: 305 uL (ref 0–1000)

## 2024-04-20 LAB — TROPONIN I (HIGH SENSITIVITY)
Troponin I (High Sensitivity): 24 ng/L — ABNORMAL HIGH (ref <18)
Troponin I (High Sensitivity): 26 ng/L — ABNORMAL HIGH (ref <18)

## 2024-04-20 LAB — CBC WITH DIFFERENTIAL/PLATELET
Abs Immature Granulocytes: 0.04 10*3/uL (ref 0.00–0.07)
Basophils Absolute: 0.1 10*3/uL (ref 0.0–0.1)
Basophils Relative: 1 %
Eosinophils Absolute: 0.3 10*3/uL (ref 0.0–0.5)
Eosinophils Relative: 4 %
HCT: 47.4 % (ref 39.0–52.0)
Hemoglobin: 15.9 g/dL (ref 13.0–17.0)
Immature Granulocytes: 1 %
Lymphocytes Relative: 17 %
Lymphs Abs: 1.4 10*3/uL (ref 0.7–4.0)
MCH: 35.8 pg — ABNORMAL HIGH (ref 26.0–34.0)
MCHC: 33.5 g/dL (ref 30.0–36.0)
MCV: 106.8 fL — ABNORMAL HIGH (ref 80.0–100.0)
Monocytes Absolute: 0.4 10*3/uL (ref 0.1–1.0)
Monocytes Relative: 5 %
Neutro Abs: 5.6 10*3/uL (ref 1.7–7.7)
Neutrophils Relative %: 72 %
Platelets: 210 10*3/uL (ref 150–400)
RBC: 4.44 MIL/uL (ref 4.22–5.81)
RDW: 13.5 % (ref 11.5–15.5)
WBC: 7.8 10*3/uL (ref 4.0–10.5)
nRBC: 0 % (ref 0.0–0.2)

## 2024-04-20 LAB — BASIC METABOLIC PANEL WITH GFR
Anion gap: 11 (ref 5–15)
BUN: 10 mg/dL (ref 8–23)
CO2: 27 mmol/L (ref 22–32)
Calcium: 9.1 mg/dL (ref 8.9–10.3)
Chloride: 99 mmol/L (ref 98–111)
Creatinine, Ser: 1.46 mg/dL — ABNORMAL HIGH (ref 0.61–1.24)
GFR, Estimated: 51 mL/min — ABNORMAL LOW (ref >60)
Glucose, Bld: 145 mg/dL — ABNORMAL HIGH (ref 70–99)
Potassium: 4.3 mmol/L (ref 3.5–5.1)
Sodium: 137 mmol/L (ref 135–145)

## 2024-04-20 LAB — PROTEIN, PLEURAL OR PERITONEAL FLUID: Total protein, fluid: <3 g/dL

## 2024-04-20 LAB — GLUCOSE, PLEURAL OR PERITONEAL FLUID: Glucose, Fluid: 126 mg/dL

## 2024-04-20 LAB — BRAIN NATRIURETIC PEPTIDE: B Natriuretic Peptide: 311 pg/mL — ABNORMAL HIGH (ref 0.0–100.0)

## 2024-04-20 LAB — LACTATE DEHYDROGENASE, PLEURAL OR PERITONEAL FLUID: LD, Fluid: 51 U/L — ABNORMAL HIGH (ref 3–23)

## 2024-04-20 MED ORDER — IOPAMIDOL (ISOVUE-370) INJECTION 76%
75.0000 mL | Freq: Once | INTRAVENOUS | Status: AC | PRN
  Administered 2024-04-20: 75 mL via INTRAVENOUS

## 2024-04-20 NOTE — ED Notes (Signed)
 Alert, NAD, calm, interactive, denies current sx or complaints, "as long as I'm not lying flat".

## 2024-04-20 NOTE — Discharge Instructions (Addendum)
 Start taking the Xarelto  again.  It is ok to hold the ASA.

## 2024-04-20 NOTE — ED Notes (Signed)
 Out in w/c to taxi

## 2024-04-20 NOTE — ED Notes (Signed)
 Alert, NAD, calm, interactive, resps e/u

## 2024-04-20 NOTE — Progress Notes (Signed)
 04/20/2024 Came back to hospital for symptomatic effusion. Same symptoms as when he left AMA. Agreeable to drain and OP pulmonary f/u. Procedure uncomplicated, fluid sent to lab (looks benign ?CHF related), post CXR looks fine Okay for DC, will arrange f/u in clinic. If symptomatic recurrence, okay to set up with me in endo, does not need to hold xarelto .  Ardelle Kos MD PCCM

## 2024-04-20 NOTE — Procedures (Signed)
 Thoracentesis  Procedure Note  Casey Jackson  [id number]  [birth year]  Date:04/20/24  Time:11:35 AM   Provider Performing:Shereena Berquist C Felipe Horton   Procedure: Thoracentesis with imaging guidance (40981)  Indication(s) Pleural Effusion  Consent Verbal understanding risk of PTX, hemothorax.  Anesthesia Topical only with 1% lidocaine     Time Out Verified patient identification, verified procedure, site/side was marked, verified correct patient position, special equipment/implants available, medications/allergies/relevant history reviewed, required imaging and test results available.   Sterile Technique Maximal sterile technique including full sterile barrier drape, hand hygiene, sterile gown, sterile gloves, mask, hair covering, sterile ultrasound probe cover (if used).  Procedure Description Ultrasound was used to identify appropriate pleural anatomy for placement and overlying skin marked.  Area of drainage cleaned and draped in sterile fashion. Lidocaine  was used to anesthetize the skin and subcutaneous tissue.  1700 cc's of straw appearing fluid was drained from the left pleural space. Catheter then removed and bandaid applied to site.   Complications/Tolerance None; patient tolerated the procedure well. Chest X-ray is ordered to confirm no post-procedural complication.   EBL Minimal   Specimen(s) Pleural fluid

## 2024-04-20 NOTE — ED Notes (Signed)
 PCCMD at [facility] for L posterior pleurocentesis by US . Tolerated well. 1680cc off. States, "breathing easier".  Pending repeat CXR.

## 2024-04-20 NOTE — ED Triage Notes (Signed)
 Patient arrived with EMS from home reports worsening SOB with chest congestion for several days / dry cough .

## 2024-04-20 NOTE — ED Notes (Signed)
 Happy meal/ snack given. Taxi voucher coordinated. Pending arrival.

## 2024-04-20 NOTE — ED Notes (Signed)
 Body fluid sent to lab. Xray at [facility].

## 2024-04-20 NOTE — ED Provider Notes (Signed)
 [place] EMERGENCY DEPARTMENT AT [facility] Provider Note   CSN: [id number] Arrival date & time: 04/20/24  4098     History  Chief Complaint  Patient presents with   Shortness of Breath    Casey Jackson is a 73 y.o. male.  Pt is a 73 yo male with pmhx significant for CAD, HLD, CHF, presbycusis, DVT, tobacco abuse, right pinna ulcer and pleural effusion.  Pt was admitted from 5/17-5/18 with sob.  Casey Jackson was found to have bilateral pleural effusions.  Pulmonary (Dr. Dione Franks) did see him and were planning on thoracentesis, but pt refused and Casey Jackson was d/c.  During his admission, Casey Jackson was also found to have a DVT in his right common femoral vein.  Casey Jackson was put on Xarelto .  Casey Jackson took the Xarelto  initially, but stopped it on 5/20 because his right pinna ulcer bled and Casey Jackson had a very hard time getting it stopped. Casey Jackson called EMS this am because Casey Jackson could not breathe when lying flat.         Home Medications Prior to Admission medications   Medication Sig Start Date End Date Taking? Authorizing Provider  acetaminophen  (TYLENOL ) 500 MG tablet Take 1 tablet (500 mg total) by mouth every 6 (six) hours as needed. Patient not taking: Reported on 09/05/2019 07/13/16   Arlina Benjamin, PA-C  aspirin  81 MG tablet Take 81 mg by mouth at bedtime.     [provider]  atorvastatin  (LIPITOR ) 20 MG tablet Take 1 tablet (20 mg total) by mouth daily. 12/23/23   Deatra Face, MD  furosemide  (LASIX ) 20 MG tablet Take 1 tablet (20 mg total) by mouth daily as needed. Leg swelling or shortness of breath 04/13/24   Cathey Clunes, MD  mupirocin  ointment (BACTROBAN ) 2 % Apply topically 2 (two) times daily. Patient not taking: Reported on 04/12/2024 06/13/21   Trish Furl, MD  rivaroxaban  (XARELTO ) 20 MG TABS tablet Take 1 tablet (20 mg total) by mouth daily with supper. 05/13/24   Sandie Cross, MD  RIVAROXABAN  (XARELTO ) VTE STARTER PACK (15 & 20 MG) Follow package directions: Take one 15mg  tablet by  mouth twice a day. On day 22, switch to one 20mg  tablet once a day. Take with food. 04/13/24   Cathey Clunes, MD      Allergies    Penicillins, Simvastatin, and Ergocalciferol    Review of Systems   Review of Systems  Respiratory:  Positive for shortness of breath.   All other systems reviewed and are negative.   Physical Exam Updated Vital Signs BP (!) 142/79   Pulse 81   Temp 98.6 F (37 C) (Oral)   Resp (!) 23   SpO2 98%  Physical Exam Vitals and nursing note reviewed.  Constitutional:      Appearance: Casey Jackson is well-developed.  HENT:     Head: Normocephalic and atraumatic.     Comments: Lesion to right ear  Eyes:     Extraocular Movements: Extraocular movements intact.     Pupils: Pupils are equal, round, and reactive to light.  Cardiovascular:     Rate and Rhythm: Normal rate and regular rhythm.  Pulmonary:     Breath sounds: Rhonchi present.  Abdominal:     General: Bowel sounds are normal.     Palpations: Abdomen is soft.  Musculoskeletal:        General: Normal range of motion.     Cervical back: Normal range of motion and neck supple.  Skin:    General:  Skin is warm.     Capillary Refill: Capillary refill takes less than 2 seconds.  Neurological:     General: No focal deficit present.     Mental Status: Casey Jackson is alert and oriented to person, place, and time.  Psychiatric:        Mood and Affect: Mood normal.        Behavior: Behavior normal.     ED Results / Procedures / Treatments   Labs (all labs ordered are listed, but only abnormal results are displayed) Labs Reviewed  BASIC METABOLIC PANEL WITH GFR - Abnormal; Notable for the following components:      Result Value   Glucose, Bld 145 (*)    Creatinine, Ser 1.46 (*)    GFR, Estimated 51 (*)    All other components within normal limits  CBC WITH DIFFERENTIAL/PLATELET - Abnormal; Notable for the following components:   MCV 106.8 (*)    MCH 35.8 (*)    All other components within normal  limits  BRAIN NATRIURETIC PEPTIDE - Abnormal; Notable for the following components:   B Natriuretic Peptide 311.0 (*)    All other components within normal limits  TROPONIN I (HIGH SENSITIVITY) - Abnormal; Notable for the following components:   Troponin I (High Sensitivity) 24 (*)    All other components within normal limits  TROPONIN I (HIGH SENSITIVITY) - Abnormal; Notable for the following components:   Troponin I (High Sensitivity) 26 (*)    All other components within normal limits  BODY FLUID CULTURE W GRAM STAIN  BODY FLUID CELL COUNT WITH DIFFERENTIAL  GLUCOSE, PLEURAL OR PERITONEAL FLUID  LACTATE DEHYDROGENASE, PLEURAL OR PERITONEAL FLUID  PROTEIN, PLEURAL OR PERITONEAL FLUID  TRIGLYCERIDES, BODY FLUIDS  CYTOLOGY - NON PAP    EKG EKG Interpretation Date/Time:  Sunday Apr 20 2024 07:03:13 EDT Ventricular Rate:  82 PR Interval:  130 QRS Duration:  147 QT Interval:  407 QTC Calculation: 476 R Axis:   -51  Text Interpretation: Sinus rhythm RBBB and LAFB LVH with secondary repolarization abnormality No significant change since last tracing Confirmed by Sueellen Emery [phone]) on 04/20/2024 7:18:13 AM  Radiology DG Chest Port 1 View Result Date: 04/20/2024 CLINICAL DATA:  [id number] S/P thoracentesis [id number] EXAM: PORTABLE CHEST - 1 VIEW COMPARISON:  04/20/2024. FINDINGS: Cardiac silhouette is unremarkable. No pneumothorax or pleural effusion. Left-sided pleural effusion resolved status post thoracentesis. The lungs are clear. Aorta is calcified. There are thoracic degenerative changes. IMPRESSION: Resolved left-sided effusion. No pneumothorax. Electronically Signed   By: Sydell Eva M.D.   On: 04/20/2024 11:57   CT Angio Chest PE W and/or Wo Contrast Result Date: 04/20/2024 EXAM: CTA of the Chest with contrast for PE 04/20/2024 10:04:58 AM TECHNIQUE: CTA of the chest was performed after the administration of intravenous contrast. Multiplanar reformatted images are provided for  review. MIP images are provided for review. Automated exposure control, iterative reconstruction, and/or weight based adjustment of the mA/kV was utilized to reduce the radiation dose to as low as reasonably achievable. COMPARISON: CT angio chest 04/12/2024. 1 view chest x-ray 04/20/2024. CLINICAL HISTORY: Pulmonary embolism (PE) suspected, high prob. 75mL Omni 350. Chief complaints; Shortness of Breath; CT Angio Chest PE W and/or Wo Contrast; Pulmonary embolism (PE) suspected, high prob. FINDINGS: PULMONARY ARTERIES: Pulmonary arteries are adequately opacified for evaluation. No pulmonary embolism. Main pulmonary artery is normal in caliber. MEDIASTINUM: The heart and pericardium demonstrate no acute abnormality. There is no acute abnormality of the thoracic aorta. Calcifications are present  in the aortic arch and right vessel origins without significant stenosis or change. Coronary artery calcifications are present. LYMPH NODES: No mediastinal, hilar or axillary lymphadenopathy. LUNGS AND PLEURA: A large left pleural effusion and associated atelectasis are similar to the prior study. A smaller right pleural effusion has slightly improved. Dependent atelectasis in the left upper lobe has increased. Centrilobular emphysematous changes are present. No pneumothorax is present. UPPER ABDOMEN: Limited images of the upper abdomen are unremarkable. SOFT TISSUES AND BONES: Fused anterior osteophytes are present in the thoracic spine with slight exaggeration of thoracic kyphosis. No focal osseous lesions are present. IMPRESSION: 1. No evidence of pulmonary embolism. 2. Large left pleural effusion and associated atelectasis, similar to the prior study. 3. Slightly improved smaller right pleural effusion. 4. Increased dependent atelectasis in the left upper lobe. Electronically signed by: Audree Leas MD 04/20/2024 10:20 AM EDT RP Workstation: [id number]   DG Chest Port 1 View Result Date: 04/20/2024 CLINICAL DATA:   Shortness of breath. EXAM: PORTABLE CHEST 1 VIEW COMPARISON:  CT 04/12/2024 FINDINGS: Moderate volume left pleural effusion is again noted and appears unchanged. Consolidation and atelectasis of the left lower lobe as before. Right lung clear. Visualized osseous structures appear grossly intact. IMPRESSION: No change in moderate volume left pleural effusion and left lower lobe consolidation and atelectasis. Electronically Signed   By: Kimberley Penman M.D.   On: 04/20/2024 07:55    Procedures Procedures    Medications Ordered in ED Medications  iopamidol  (ISOVUE -370) 76 % injection 75 mL (75 mLs Intravenous Contrast Given 04/20/24 1005)    ED Course/ Medical Decision Making/ A&P                                 Medical Decision Making Amount and/or Complexity of Data Reviewed Labs: ordered. Radiology: ordered.  Risk Prescription drug management.   This patient presents to the ED for concern of sob, this involves an extensive number of treatment options, and is a complaint that carries with it a high risk of complications and morbidity.  The differential diagnosis includes chf, copd, PE   Co morbidities that complicate the patient evaluation  CAD, HLD, CHF, presbycusis, DVT, tobacco abuse, right pinna ulcer and pleural effusion   Additional history obtained:  Additional history obtained from epic chart review External records from outside source obtained and reviewed including EMS report   Lab Tests:  I Ordered, and personally interpreted labs.  The pertinent results include:  cbc nl, bmp with cr 1.46 (stable), bnp 311, trop 41   Imaging Studies ordered:  I ordered imaging studies including cxr and ct chest  I independently visualized and interpreted imaging which showed  CXR: No change in moderate volume left pleural effusion and left lower  lobe consolidation and atelectasis.  CT chest: No evidence of pulmonary embolism.  2. Large left pleural effusion and associated  atelectasis, similar to the prior  study.  3. Slightly improved smaller right pleural effusion.  4. Increased dependent atelectasis in the left upper lobe.   I agree with the radiologist interpretation   Cardiac Monitoring:  The patient was maintained on a cardiac monitor.  I personally viewed and interpreted the cardiac monitored which showed an underlying rhythm of: nsr   Medicines ordered and prescription drug management:  I have reviewed the patients home medicines and have made adjustments as needed   Test Considered:  ct   Critical Interventions:  thoracentesis  Consultations Obtained:  I requested consultation with the pulmonology (Dr. Felipe Horton),  and discussed lab and imaging findings as well as pertinent plan - Casey Jackson will do thoracentesis   Problem List / ED Course:  Left pleural effusion:  pt is now willing to have the fluid drained.  Dr. Felipe Horton performed thoracentesis and pt feels much better.  Dr. Felipe Horton will arrange for f/u in the pulm clinic Hx DVT:  pt told to start taking the Xarelto  again.   Reevaluation:  After the interventions noted above, I reevaluated the patient and found that they have :improved   Social Determinants of Health:  Lives at home   Dispostion:  After consideration of the diagnostic results and the patients response to treatment, I feel that the patent would benefit from discharge with outpatient f/u.          Final Clinical Impression(s) / ED Diagnoses Final diagnoses:  Pleural effusion on left    Rx / DC Orders ED Discharge Orders     None         Sueellen Emery, MD 04/20/24 1207

## 2024-04-20 NOTE — Telephone Encounter (Signed)
 1-2 week hospital f/u with ND or any to review pleural effusion labs and breathing

## 2024-04-20 NOTE — ED Notes (Signed)
 MD at [facility]

## 2024-04-21 LAB — TRIGLYCERIDES, BODY FLUIDS: Triglycerides, Fluid: 14 mg/dL

## 2024-04-22 NOTE — Telephone Encounter (Signed)
 LM for PT first avail appt is with Dr. Dione Franks on June 30th. Adv to CB right away in order to secure it.

## 2024-04-23 LAB — CYTOLOGY - NON PAP

## 2024-04-23 LAB — BODY FLUID CULTURE W GRAM STAIN: Gram Stain: NONE SEEN

## 2024-04-24 ENCOUNTER — Encounter: Payer: Self-pay | Admitting: Pulmonary Disease

## 2024-04-24 NOTE — Telephone Encounter (Signed)
 Sent postal letter to call for appt.

## 2024-04-26 ENCOUNTER — Other Ambulatory Visit: Payer: Self-pay

## 2024-04-26 ENCOUNTER — Emergency Department (HOSPITAL_COMMUNITY)

## 2024-04-26 ENCOUNTER — Observation Stay (HOSPITAL_COMMUNITY)
Admission: EM | Admit: 2024-04-26 | Discharge: 2024-04-27 | Disposition: A | Discharge status: Home / Self Care | Attending: Internal Medicine | Admitting: Internal Medicine

## 2024-04-26 ENCOUNTER — Encounter (HOSPITAL_COMMUNITY): Payer: Self-pay

## 2024-04-26 DIAGNOSIS — Z7982 Long term (current) use of aspirin: Secondary | ICD-10-CM | POA: Insufficient documentation

## 2024-04-26 DIAGNOSIS — R0989 Other specified symptoms and signs involving the circulatory and respiratory systems: Secondary | ICD-10-CM | POA: Diagnosis not present

## 2024-04-26 DIAGNOSIS — E785 Hyperlipidemia, unspecified: Secondary | ICD-10-CM | POA: Insufficient documentation

## 2024-04-26 DIAGNOSIS — Z7901 Long term (current) use of anticoagulants: Secondary | ICD-10-CM | POA: Diagnosis not present

## 2024-04-26 DIAGNOSIS — N1831 Chronic kidney disease, stage 3a: Secondary | ICD-10-CM | POA: Diagnosis not present

## 2024-04-26 DIAGNOSIS — I130 Hypertensive heart and chronic kidney disease with heart failure and stage 1 through stage 4 chronic kidney disease, or unspecified chronic kidney disease: Secondary | ICD-10-CM | POA: Insufficient documentation

## 2024-04-26 DIAGNOSIS — Z86718 Personal history of other venous thrombosis and embolism: Secondary | ICD-10-CM | POA: Diagnosis not present

## 2024-04-26 DIAGNOSIS — I5020 Unspecified systolic (congestive) heart failure: Secondary | ICD-10-CM | POA: Diagnosis not present

## 2024-04-26 DIAGNOSIS — R0689 Other abnormalities of breathing: Secondary | ICD-10-CM | POA: Diagnosis not present

## 2024-04-26 DIAGNOSIS — Z955 Presence of coronary angioplasty implant and graft: Secondary | ICD-10-CM | POA: Diagnosis not present

## 2024-04-26 DIAGNOSIS — R0789 Other chest pain: Principal | ICD-10-CM | POA: Insufficient documentation

## 2024-04-26 DIAGNOSIS — R079 Chest pain, unspecified: Principal | ICD-10-CM | POA: Diagnosis present

## 2024-04-26 DIAGNOSIS — Z79899 Other long term (current) drug therapy: Secondary | ICD-10-CM | POA: Diagnosis not present

## 2024-04-26 DIAGNOSIS — I255 Ischemic cardiomyopathy: Secondary | ICD-10-CM | POA: Insufficient documentation

## 2024-04-26 DIAGNOSIS — H61191 Noninfective disorders of pinna, right ear: Secondary | ICD-10-CM | POA: Diagnosis not present

## 2024-04-26 DIAGNOSIS — N179 Acute kidney failure, unspecified: Secondary | ICD-10-CM | POA: Diagnosis not present

## 2024-04-26 DIAGNOSIS — J90 Pleural effusion, not elsewhere classified: Secondary | ICD-10-CM | POA: Diagnosis not present

## 2024-04-26 DIAGNOSIS — I2510 Atherosclerotic heart disease of native coronary artery without angina pectoris: Secondary | ICD-10-CM | POA: Diagnosis not present

## 2024-04-26 DIAGNOSIS — F17200 Nicotine dependence, unspecified, uncomplicated: Secondary | ICD-10-CM | POA: Insufficient documentation

## 2024-04-26 NOTE — ED Triage Notes (Signed)
 BIBA Ems reports patient c/o chest pain that has become increasly worse since thoracentesis 04/20/2024. EMS admin ASA 324mg  po 1 nitroglycerin  SL admin with no relief.

## 2024-04-27 ENCOUNTER — Other Ambulatory Visit: Payer: Self-pay

## 2024-04-27 DIAGNOSIS — J90 Pleural effusion, not elsewhere classified: Secondary | ICD-10-CM | POA: Insufficient documentation

## 2024-04-27 DIAGNOSIS — R0789 Other chest pain: Secondary | ICD-10-CM | POA: Diagnosis not present

## 2024-04-27 LAB — CBC WITH DIFFERENTIAL/PLATELET
Abs Immature Granulocytes: 0.02 10*3/uL (ref 0.00–0.07)
Basophils Absolute: 0 10*3/uL (ref 0.0–0.1)
Basophils Relative: 1 %
Eosinophils Absolute: 0.3 10*3/uL (ref 0.0–0.5)
Eosinophils Relative: 4 %
HCT: 42.4 % (ref 39.0–52.0)
Hemoglobin: 14.7 g/dL (ref 13.0–17.0)
Immature Granulocytes: 0 %
Lymphocytes Relative: 14 %
Lymphs Abs: 1.1 10*3/uL (ref 0.7–4.0)
MCH: 36.5 pg — ABNORMAL HIGH (ref 26.0–34.0)
MCHC: 34.7 g/dL (ref 30.0–36.0)
MCV: 105.2 fL — ABNORMAL HIGH (ref 80.0–100.0)
Monocytes Absolute: 0.5 10*3/uL (ref 0.1–1.0)
Monocytes Relative: 6 %
Neutro Abs: 5.9 10*3/uL (ref 1.7–7.7)
Neutrophils Relative %: 75 %
Platelets: 207 10*3/uL (ref 150–400)
RBC: 4.03 MIL/uL — ABNORMAL LOW (ref 4.22–5.81)
RDW: 13.2 % (ref 11.5–15.5)
WBC: 7.8 10*3/uL (ref 4.0–10.5)
nRBC: 0 % (ref 0.0–0.2)

## 2024-04-27 LAB — BASIC METABOLIC PANEL WITH GFR
Anion gap: 9 (ref 5–15)
BUN: 14 mg/dL (ref 8–23)
CO2: 28 mmol/L (ref 22–32)
Calcium: 8.5 mg/dL — ABNORMAL LOW (ref 8.9–10.3)
Chloride: 100 mmol/L (ref 98–111)
Creatinine, Ser: 1.37 mg/dL — ABNORMAL HIGH (ref 0.61–1.24)
GFR, Estimated: 55 mL/min — ABNORMAL LOW (ref >60)
Glucose, Bld: 116 mg/dL — ABNORMAL HIGH (ref 70–99)
Potassium: 4.1 mmol/L (ref 3.5–5.1)
Sodium: 137 mmol/L (ref 135–145)

## 2024-04-27 LAB — TROPONIN I (HIGH SENSITIVITY)
Troponin I (High Sensitivity): 37 ng/L — ABNORMAL HIGH (ref <18)
Troponin I (High Sensitivity): 35 ng/L — ABNORMAL HIGH (ref <18)

## 2024-04-27 MED ORDER — RIVAROXABAN 15 MG PO TABS
15.0000 mg | ORAL_TABLET | Freq: Two times a day (BID) | ORAL | Status: DC
  Administered 2024-04-27: 15 mg via ORAL
  Filled 2024-04-27 (×2): qty 1

## 2024-04-27 MED ORDER — HEPARIN (PORCINE) 25000 UT/250ML-% IV SOLN
1200.0000 [IU]/h | INTRAVENOUS | Status: DC
  Administered 2024-04-27: 1200 [IU]/h via INTRAVENOUS
  Filled 2024-04-27: qty 250

## 2024-04-27 MED ORDER — RIVAROXABAN 20 MG PO TABS: 20.0000 mg | ORAL_TABLET | Freq: Every day | ORAL | Status: DC

## 2024-04-27 MED ORDER — PANTOPRAZOLE SODIUM 40 MG PO TBEC: 40.0000 mg | DELAYED_RELEASE_TABLET | Freq: Every day | ORAL | 0 refills | Status: AC

## 2024-04-27 MED ORDER — HEPARIN BOLUS VIA INFUSION
4000.0000 [IU] | Freq: Once | INTRAVENOUS | Status: AC
  Administered 2024-04-27: 4000 [IU] via INTRAVENOUS
  Filled 2024-04-27: qty 4000

## 2024-04-27 MED ORDER — ACETAMINOPHEN 325 MG PO TABS: 650.0000 mg | ORAL_TABLET | Freq: Four times a day (QID) | ORAL | Status: DC | PRN

## 2024-04-27 MED ORDER — RIVAROXABAN (XARELTO) VTE STARTER PACK (15 & 20 MG): 15.0000 mg | ORAL_TABLET | Freq: Two times a day (BID) | ORAL | Status: DC

## 2024-04-27 MED ORDER — ALBUTEROL SULFATE (2.5 MG/3ML) 0.083% IN NEBU
2.5000 mg | INHALATION_SOLUTION | RESPIRATORY_TRACT | Status: DC | PRN
Start: 2024-04-27 — End: 2024-04-27

## 2024-04-27 MED ORDER — ACETAMINOPHEN 650 MG RE SUPP: 650.0000 mg | Freq: Four times a day (QID) | RECTAL | Status: DC | PRN

## 2024-04-27 MED ORDER — ATORVASTATIN CALCIUM 10 MG PO TABS
20.0000 mg | ORAL_TABLET | Freq: Every day | ORAL | Status: DC
  Administered 2024-04-27: 20 mg via ORAL
  Filled 2024-04-27: qty 2

## 2024-04-27 MED ORDER — PANTOPRAZOLE SODIUM 40 MG PO TBEC
40.0000 mg | DELAYED_RELEASE_TABLET | Freq: Every day | ORAL | Status: DC
  Administered 2024-04-27: 40 mg via ORAL
  Filled 2024-04-27: qty 1

## 2024-04-27 MED ORDER — SENNOSIDES-DOCUSATE SODIUM 8.6-50 MG PO TABS: 1.0000 | ORAL_TABLET | Freq: Every evening | ORAL | Status: DC | PRN

## 2024-04-27 NOTE — ED Provider Notes (Signed)
 [facility] Provider Note   CSN: [id number] Arrival date & time: 04/26/24  4098     History  Chief Complaint  Patient presents with   Chest Pain    Chest pain x3 days s/p thoracentesis 04/20/2024    Casey Jackson is a 73 y.o. male.  Patient with history of DVT on Eliquis, left-sided pleural effusion most recently drained on May 25, NSTEMI, coronary artery disease, chronic CHF presents to the emergency department complaining of left-sided chest pain.  He states that since having thoracentesis on May 25 he has had continued chest pain on the left side.  He states that if he lays on his right side chest pain diminishes but when he lays on left side it worsens.  EMS administered 344 mg of aspirin  and 1 dose of sublingual nitroglycerin  with no relief of symptoms.  He currently denies shortness of breath.  He states he feels like that when the needle pierced his chest it might have touched his heart.  No abdominal pain, nausea, vomiting, radiation of symptoms.  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   Chest Pain      Home Medications Prior to Admission medications   Medication Sig Start Date End Date Taking? Authorizing Provider  acetaminophen  (TYLENOL ) 500 MG tablet Take 1 tablet (500 mg total) by mouth every 6 (six) hours as needed. Patient not taking: Reported on 09/05/2019 07/13/16   Arlina Benjamin, PA-C  aspirin  81 MG tablet Take 81 mg by mouth at bedtime.     [provider]  atorvastatin  (LIPITOR ) 20 MG tablet Take 1 tablet (20 mg total) by mouth daily. 12/23/23   Deatra Face, MD  furosemide  (LASIX ) 20 MG tablet Take 1 tablet (20 mg total) by mouth daily as needed. Leg swelling or shortness of breath 04/13/24   Cathey Clunes, MD  mupirocin  ointment (BACTROBAN ) 2 % Apply topically 2 (two) times  daily. Patient not taking: Reported on 04/12/2024 06/13/21   Trish Furl, MD  rivaroxaban  (XARELTO ) 20 MG TABS tablet Take 1 tablet (20 mg total) by mouth daily with supper. 05/13/24   Sandie Cross, MD  RIVAROXABAN  (XARELTO ) VTE STARTER PACK (15 & 20 MG) Follow package directions: Take one 15mg  tablet by mouth twice a day. On day 22, switch to one 20mg  tablet once a day. Take with food. 04/13/24   Cathey Clunes, MD      Allergies    Penicillins, Simvastatin, and Ergocalciferol    Review of Systems   Review of Systems  Cardiovascular:  Positive for chest pain.    Physical Exam Updated Vital Signs BP 132/76   Pulse 76   Temp 98.5 F (36.9 C) (Oral)   Resp (!) 0   Wt 79.4 kg   SpO2 96%   BMI 24.41 kg/m  Physical Exam Vitals and nursing note reviewed.  Constitutional:      Appearance: He is well-developed.  HENT:     Head: Normocephalic and atraumatic.     Comments: Lesion to right ear  Eyes:     Extraocular Movements: Extraocular movements intact.  Cardiovascular:     Rate and Rhythm: Normal rate and regular rhythm.  Pulmonary:     Breath sounds: Examination of the left-lower field reveals decreased breath sounds. Decreased breath sounds and rhonchi present.  Chest:  Chest wall: No tenderness.  Abdominal:     General: Bowel sounds are normal.     Palpations: Abdomen is soft.  Musculoskeletal:        General: Normal range of motion.     Cervical back: Normal range of motion and neck supple.  Skin:    General: Skin is warm.     Capillary Refill: Capillary refill takes less than 2 seconds.  Neurological:     General: No focal deficit present.     Mental Status: He is alert and oriented to person, place, and time.  Psychiatric:        Mood and Affect: Mood normal.        Behavior: Behavior normal.     ED Results / Procedures / Treatments   Labs (all labs ordered are listed, but only abnormal results are displayed) Labs Reviewed  BASIC METABOLIC  PANEL WITH GFR - Abnormal; Notable for the following components:      Result Value   Glucose, Bld 116 (*)    Creatinine, Ser 1.37 (*)    Calcium  8.5 (*)    GFR, Estimated 55 (*)    All other components within normal limits  CBC WITH DIFFERENTIAL/PLATELET - Abnormal; Notable for the following components:   RBC 4.03 (*)    MCV 105.2 (*)    MCH 36.5 (*)    All other components within normal limits  TROPONIN I (HIGH SENSITIVITY) - Abnormal; Notable for the following components:   Troponin I (High Sensitivity) 37 (*)    All other components within normal limits  TROPONIN I (HIGH SENSITIVITY)    EKG EKG Interpretation Date/Time:  Sunday April 27 2024 00:22:14 EDT Ventricular Rate:  74 PR Interval:  130 QRS Duration:  142 QT Interval:  428 QTC Calculation: 475 R Axis:   -58  Text Interpretation: Sinus arrhythmia Left bundle branch block Baseline wander in lead(s) V3 Confirmed by Eldon Greenland ([id number]) on 04/27/2024 12:52:20 AM  Radiology DG Chest Portable 1 View Result Date: 04/27/2024 EXAM: 1 VIEW XRAY OF THE CHEST 04/26/2024 11:58:48 PM COMPARISON: 04/20/2024 CLINICAL HISTORY: Chest pain. FINDINGS: LUNGS AND PLEURA: Moderate left pleural effusion, recurrent. Associated left lower lobe opacity, recurrent, likely atelectasis. HEART AND MEDIASTINUM: No acute abnormality of the cardiac and mediastinal silhouettes. Thoracic aortic atherosclerosis. BONES AND SOFT TISSUES: No acute osseous abnormality. IMPRESSION: 1. Moderate left pleural effusion, recurrent. 2. Associated left lower lobe opacity, recurrent, likely atelectasis. Electronically signed by: Zadie Herter MD 04/27/2024 12:02 AM EDT RP Workstation: [id number]    Procedures Procedures    Medications Ordered in ED Medications  atorvastatin  (LIPITOR ) tablet 20 mg (has no administration in time range)  acetaminophen  (TYLENOL ) tablet 650 mg (has no administration in time range)    Or  acetaminophen  (TYLENOL ) suppository 650 mg  (has no administration in time range)  senna-docusate (Senokot-S) tablet 1 tablet (has no administration in time range)  albuterol  (PROVENTIL ) (2.5 MG/3ML) 0.083% nebulizer solution 2.5 mg (has no administration in time range)    ED Course/ Medical Decision Making/ A&P                                 Medical Decision Making Amount and/or Complexity of Data Reviewed Labs: ordered. Radiology: ordered. ECG/medicine tests: ordered.  Risk Decision regarding hospitalization.   This patient presents to the ED for concern of left sided chest pain, this involves an extensive number of treatment options, and  is a complaint that carries with it a high risk of complications and morbidity.  The differential diagnosis includes pleural effusion, ACS, pneumonia, PE, others   Co morbidities / Chronic conditions that complicate the patient evaluation  Recent left-sided pleural effusion, DVT on Xarelto    Additional history obtained:  Additional history obtained from EMR External records from outside source obtained and reviewed including recent pulmonology notes showing pleural effusion drainage   Lab Tests:  I Ordered, and personally interpreted labs.  The pertinent results include: Initial troponin 37   Imaging Studies ordered:  I ordered imaging studies including chest x-ray I independently visualized and interpreted imaging which showed  1. Moderate left pleural effusion, recurrent.  2. Associated left lower lobe opacity, recurrent, likely atelectasis   I agree with the radiologist interpretation   Cardiac Monitoring: / EKG:  The patient was maintained on a cardiac monitor.  I personally viewed and interpreted the cardiac monitored which showed an underlying rhythm of: Sinus arrhythmia   Consultations Obtained:  I requested consultation with the internal medicine team,  and discussed lab and imaging findings as well as pertinent plan - they recommend: admission   Social  Determinants of Health:  Patient is a daily smoker   Test / Admission - Considered:  Patient with chest pain related to recurrent left-sided pleural effusion.  Mildly elevated troponin, labs otherwise grossly unremarkable.  Plan to admit to medicine for further workup including likely repeat thoracentesis.         Final Clinical Impression(s) / ED Diagnoses Final diagnoses:  Chest pain, unspecified type  Recurrent pleural effusion on left    Rx / DC Orders ED Discharge Orders     None         Fatih, [place] 04/27/24 0402    Eldon Greenland, MD 04/27/24 [id number]

## 2024-04-27 NOTE — Progress Notes (Signed)
 PHARMACY - ANTICOAGULATION CONSULT NOTE  Pharmacy Consult for Heparin  Indication: LLE DVT (04/12/24)  Allergies  Allergen Reactions   Penicillins Swelling and Other (See Comments)    Has patient had a PCN reaction causing immediate rash, facial/tongue/throat swelling, SOB or lightheadedness with hypotension: YES Has patient had a PCN reaction causing severe rash involving mucus membranes or skin necrosis: YES Has patient had a PCN reaction that required hospitalization NO, REACTION OCCURRED IN MD OFFICE Has patient had a PCN reaction occurring within the last 10 years: NO 1968 If all of the above answers are "NO", then may proceed with Cephalosporin use.    Simvastatin Other (See Comments)    Muscle cramps   Ergocalciferol Palpitations    Patient Measurements: Height: 5\' 11"  (180.3 cm) Weight: 79.4 kg (175 lb) IBW/kg (Calculated) : 75.3  Vital Signs: Temp: 98.5 F (36.9 C) (05/31 2331) Temp Source: Oral (05/31 2331) BP: 132/76 (06/01 0130) Pulse Rate: 76 (06/01 0130)  Labs: Recent Labs    04/27/24 0018  HGB 14.7  HCT 42.4  PLT 207  CREATININE 1.37*  TROPONINIHS 37*    Estimated Creatinine Clearance: 51.9 mL/min (A) (by C-G formula based on SCr of 1.37 mg/dL (H)).   Medical History: Past Medical History:  Diagnosis Date   Coronary artery disease    a. Big NSTEMI 11/2014: cath with surgical disease but patient initially refused CABG. Compromise decision was made to do intervention on the RCA with a bare metal stent with medical therapy for other disease and follow-up as an outpatient.   HOH (hard of hearing)    Hyperlipidemia    Ischemic cardiomyopathy    a. a. Cath 12/22/14: EF 25%. b. 2D Echo 12/23/14: EF 25-30%, diffuse hypokinesis, akinesis of the entireinferolateral and inferior myocardium, mild MR.   Myocardial infarction [facility])    NSTEMI   Osteoarthritis    Pneumonia    Tobacco abuse    Transaminitis    Assessment: 73 yr old male to begin IV heparin  for  VTE treatment. Hx LLE DVT per duplex 04/12/24.  Recent admit and discharged on Xarelto . Patient reports last Xarelto  dose "middle of the week".  Hx thoracentesis 04/20/24, 1700 cc removed. May need repeat thoracentesis. Recent Xarelto  doses can falsely elevate heparin  levels, so will check aPTT with first heparin  level. Heparin  levels may not be skewed if more than a few days since last dose.  Goal of Therapy:  Heparin  level 0.3-0.7 units/ml aPTT 66-102 seconds Monitor platelets by anticoagulation protocol: Yes   Plan:  Heparin  4000 units IV bolus Heparin  drip to begin at 1200 units/hr. aPTT and heparin  level ~8 hrs after drip begins. Daily heparin  level and CBC; add daily aPTT if needed. Xarelto  on hold.  Adolphus Akin, RPh 04/27/2024,4:18 AM

## 2024-04-27 NOTE — Discharge Summary (Addendum)
 Name: Casey Jackson MRN: [id number] DOB: December 01, 1950 73 y.o. PCP: [facility] [address]  Date of Admission: 04/26/2024 11:31 PM Date of Discharge:  04/27/2024 Attending Physician: Dr. Broadus Canes  DISCHARGE DIAGNOSIS:  Primary Problem: Chest pain   Hospital Problems: Principal Problem:   Chest pain    DISCHARGE MEDICATIONS:   Allergies as of 04/27/2024       Reactions   Penicillins Swelling, Other (See Comments)   Has patient had a PCN reaction causing immediate rash, facial/tongue/throat swelling, SOB or lightheadedness with hypotension: YES Has patient had a PCN reaction causing severe rash involving mucus membranes or skin necrosis: YES Has patient had a PCN reaction that required hospitalization NO, REACTION OCCURRED IN MD OFFICE Has patient had a PCN reaction occurring within the last 10 years: NO 1968 If all of the above answers are "NO", then may proceed with Cephalosporin use.   Simvastatin Other (See Comments)   Muscle cramps   Ergocalciferol Palpitations        Medication List     STOP taking these medications    mupirocin  ointment 2 % Commonly known as: BACTROBAN        TAKE these medications    acetaminophen  500 MG tablet Commonly known as: TYLENOL  Take 1 tablet (500 mg total) by mouth every 6 (six) hours as needed.   aspirin  81 MG tablet Take 81 mg by mouth at bedtime.   atorvastatin  20 MG tablet Commonly known as: LIPITOR  Take 1 tablet (20 mg total) by mouth daily.   furosemide  20 MG tablet Commonly known as: Lasix  Take 1 tablet (20 mg total) by mouth daily as needed. Leg swelling or shortness of breath   pantoprazole 40 MG tablet Commonly known as: PROTONIX Take 1 tablet (40 mg total) by mouth daily. Start taking on: April 28, 2024   Rivaroxaban  Starter Pack (15 mg and 20 mg) Commonly known as: XARELTO  STARTER PACK Follow package directions: Take one 15mg  tablet by mouth twice a day. On day 22, switch to one 20mg  tablet once a day. Take with  food.   rivaroxaban  20 MG Tabs tablet Commonly known as: XARELTO  Take 1 tablet (20 mg total) by mouth daily with supper. Start taking on: May 13, 2024        DISPOSITION AND FOLLOW-UP:  Mr.Emrick Bartmess was discharged from [facility] [place] condition. At the hospital follow up visit please address:  Follow-up Recommendations: Consults: ENT/dermatology for concerning right ear lesion.  Cardiology given complex cardiac history, chest pain, lost to follow-up.  PCP follow-up for care management. Labs: Basic Metabolic Profile, CBC, Chest x-ray, Cholesterol, Hgb A1C, LDL Cholesterol, and TSH Studies: Consider repeat chest x-ray, AAA screening, yearly lung cancer screening, age-appropriate cancer screenings such as colonoscopy Medications: Will initiate Protonix 40 mg daily, recommend continuing Xarelto  as described at time of previous discharge.  Follow-up Appointments: Patient states that he has PCP follow-up upcoming on May 06, 2024, he is excited to establish care with [facility]   HOSPITAL COURSE:  Patient Summary:  Casey Jackson is a 73 year old male with a history of  HLD, CAD with previous MI s/p PCI of the RCA  2016, HFrEF 25-20% recently discharged from our service in the setting of ongoing shortness of breath due to left-sided pleural effusion.  At the time of last admission, patient declined thoracentesis and was discharged in fair condition.  He was also found to have lower extremity DVT and was discharged with Xarelto .  He returned to the  ED shortly after his discharge due to ongoing shortness of breath and received a thoracentesis, he was not admitted to the hospital following this procedure.  Patient again presented on the a.m. of April 27, 2024 due to chest pain.  He denies shortness of breath during this time, stating his symptoms have resolved from his previous hospitalization.  He has stopped taking his Xarelto  as his chronic right ear wound would  bleed while he was on this medication.  Given the patient's ongoing signs/symptoms, GI etiologies seem less likely for his chest pain.  No significant cardiac abnormalities seen on this admission.  He was discharged in fair condition with prescription for Protonix and will follow-up with his primary care provider to establish care on May 06, 2024.   Hospital Course by problem list:  Chest pain Patient endorses ongoing chest pain for the last week.  He says he feels that is related to what he eats, the pain usually subsides by nighttime but when he gets up and drinks coffee and lays back down he endorses chest pain. He states that he believes that when he eats fatty or greasy foods his blood gets thickened and clogs his coronary arteries causing him to have chest pain.  He does endorse recent regurgitation/rumination of enteric contents as well as ongoing indigestion.  EKG and troponins here unremarkable for cardiac etiology.  Certainly is high risk for cardiac chest pain given his well-known history of MI and CAD. Plan: - Discharged with Protonix 40 mg daily - Will follow with PCP, establishing on June 10 - Consider additional workup such as EGD/right upper quadrant imaging as indicated. - Will require additional cardiology workup  L pleural effusion Patient underwent thoracentesis with Dr. Felipe Horton in ED on 04/20/2024.  Pleural studies most consistent with transudative effusion.  No malignant cells seen at that time.  May be secondary to heart failure.  This admission chest x-ray was obtained which demonstrated reaccumulation of left pleural effusion.  Patient denies dyspnea at this time.  He states he would not like additional procedures for his effusion.   LLE DVT Prescribe Xarelto  on previous discharge, patient stopped taking his medication due to bleeding from his chronic right ear wound.  Recommend patient continues to take this medication as prescribed at previous discharge.  No significant calf  tenderness or asymmetry of the lower extremities today.  Trace pedal edema bilaterally.  No pleuritic chest pain or dyspnea to suggest PE.   CKD 3A versus AKI Discharged with furosemide  20 mg p.o. daily.  Creatinine on admission 1.37, similar to prior admission.  GFR 55, likely representing underlying CKD 3A though additional optimization and repeat assessment may be necessary as patient had GFR greater than 60 with creatinine 1.14 when seen in October 2023.   Severe 3 vessel CAD s/p PCI of RCA in 2016 Ischemic cardiomyopathy HLDHTN Declined multiple therapies/intolerant of multiple GDMT medications per past documentation.  Patient states that he will follow-up with cardiology once he established with [facility]. - Patient require dedicated cardiology follow-up   Tobacco use 75-pack-year history.  - Will need AAA screening   Right pinna ulceration Concerning for squamous or basal cell carcinoma.  Recommend careful attention at PCP follow-up.   DISCHARGE INSTRUCTIONS:   Discharge Instructions     (HEART FAILURE PATIENTS) Call MD:  Anytime you have any of the following symptoms: 1) 3 pound weight gain in 24 hours or 5 pounds in 1 week 2) shortness of breath, with or without a  dry hacking cough 3) swelling in the hands, feet or stomach 4) if you have to sleep on extra pillows at night in order to breathe.   Complete by: As directed    Call MD for:  difficulty breathing, headache or visual disturbances   Complete by: As directed    Call MD for:  extreme fatigue   Complete by: As directed    Call MD for:  persistant dizziness or light-headedness   Complete by: As directed    Call MD for:  persistant nausea and vomiting   Complete by: As directed    Call MD for:  severe uncontrolled pain   Complete by: As directed    Call MD for:  temperature >100.4   Complete by: As directed    Diet - low sodium heart healthy   Complete by: As directed    Discharge instructions   Complete by:  As directed    You were hospitalized for chest pain.  It does not appear that your chest pain is coming from your heart at this time.  Thank you for allowing us  to be part of your care.   We arranged for you to follow up at: [facility] to meet your new primary care doctor on May 06, 2024.   Please note these changes made to your medications:   Please START taking:  Protonix 40 mg daily to help with indigestion and stomach acid which is likely causing your pain.  I would recommend that you continue taking your Xarelto  (blood thinner) for your blood clot in your leg.  Your new doctor should pay careful attention to your right ear, it is concerning that you might have cancer of the ear causing it to scab over and bleed.  Please make sure that you follow with a heart doctor given your cardiac history to further assess your chest pain.  Given your heart failure and the fact that you have fluid around your lung, it would be important to pay careful attention to your breathing and optimize your diuretic regimen to make sure you are not holding onto too much fluid.  I have included additional information regarding your blood clot, kidneys, heart, smoking history, ER, and chest pain and your hospital discharge summary.  If your primary care physician is not able to access these, please have them reach out to the hospital or to obtain this information.   Increase activity slowly   Complete by: As directed        SUBJECTIVE:   Patient seen this a.m. on rounds.  Adamantly denies dyspnea, though he states this was an issue prior to his thoracentesis.  He states he has not had symptoms following his thoracentesis unable to lay on his left and right side without significant dyspnea.  The patient states that what brought him into the hospital was the fact that he was having chest pain.  He describes the chest pain as a constant dull pain which is related to eating.  He endorses indigestion and  regurgitation of foods.  He states his pain is worse after drinking coffee.  He states has been going on for about 1 week.  Discharge Vitals:   BP 127/76 (BP Location: Right Arm)   Pulse (!) 58   Temp (!) 97.3 F (36.3 C) (Oral)   Resp 15   Ht 5\' 11"  (1.803 m)   Wt 79.4 kg   SpO2 98%   BMI 24.41 kg/m   OBJECTIVE:  Constitutional: No  acute distress Cardiac: Regular rate and rhythm no rubs gallops or murmurs Pulmonary: Room air.  Lungs clear to auscultation bilaterally.  Decreased breath sounds left lower lung.  Hyperresonance over left lower lung. Abdomen: Nondistended nontender Extremities: Bilateral trace edema to ankles ENT: Erythematous, ulcerated lesion of the right ear with overlying scab.  Concerning for malignancy Psych: Pleasant affect  Pertinent Labs, Studies, and Procedures:     Latest Ref Rng & Units 04/27/2024   12:18 AM 04/20/2024    7:13 AM 04/13/2024    5:07 AM  CBC  WBC 4.0 - 10.5 K/uL 7.8  7.8  7.5   Hemoglobin 13.0 - 17.0 g/dL 13.0  86.5  78.4   Hematocrit 39.0 - 52.0 % 42.4  47.4  42.2   Platelets 150 - 400 K/uL 207  210  187        Latest Ref Rng & Units 04/27/2024   12:18 AM 04/20/2024    7:13 AM 04/13/2024    5:07 AM  CMP  Glucose 70 - 99 mg/dL 696  295  99   BUN 8 - 23 mg/dL 14  10  13    Creatinine 0.61 - 1.24 mg/dL 2.84  1.32  4.40   Sodium 135 - 145 mmol/L 137  137  138   Potassium 3.5 - 5.1 mmol/L 4.1  4.3  4.1   Chloride 98 - 111 mmol/L 100  99  102   CO2 22 - 32 mmol/L 28  27  28    Calcium  8.9 - 10.3 mg/dL 8.5  9.1  8.6   Total Protein 6.5 - 8.1 g/dL   6.0   Total Bilirubin 0.0 - 1.2 mg/dL   1.2   Alkaline Phos 38 - 126 U/L   57   AST 15 - 41 U/L   15   ALT 0 - 44 U/L   12     DG Chest Portable 1 View Result Date: 04/27/2024 EXAM: 1 VIEW XRAY OF THE CHEST 04/26/2024 11:58:48 PM COMPARISON: 04/20/2024 CLINICAL HISTORY: Chest pain. FINDINGS: LUNGS AND PLEURA: Moderate left pleural effusion, recurrent. Associated left lower lobe opacity,  recurrent, likely atelectasis. HEART AND MEDIASTINUM: No acute abnormality of the cardiac and mediastinal silhouettes. Thoracic aortic atherosclerosis. BONES AND SOFT TISSUES: No acute osseous abnormality. IMPRESSION: 1. Moderate left pleural effusion, recurrent. 2. Associated left lower lobe opacity, recurrent, likely atelectasis. Electronically signed by: Zadie Herter MD 04/27/2024 12:02 AM EDT RP Workstation: [id number]     Signed: Sheree Dieter MD Internal Medicine Resident, PGY-1 [facility]  Pager: [phone] 11:49 AM, 04/27/2024

## 2024-04-27 NOTE — Progress Notes (Signed)
 PHARMACY - ANTICOAGULATION CONSULT NOTE  Pharmacy Consult for Xarelto  Indication: LLE DVT (04/12/24)  Allergies  Allergen Reactions   Penicillins Swelling and Other (See Comments)    Has patient had a PCN reaction causing immediate rash, facial/tongue/throat swelling, SOB or lightheadedness with hypotension: YES Has patient had a PCN reaction causing severe rash involving mucus membranes or skin necrosis: YES Has patient had a PCN reaction that required hospitalization NO, REACTION OCCURRED IN MD OFFICE Has patient had a PCN reaction occurring within the last 10 years: NO 1968 If all of the above answers are "NO", then may proceed with Cephalosporin use.    Simvastatin Other (See Comments)    Muscle cramps   Ergocalciferol Palpitations    Patient Measurements: Height: 5\' 11"  (180.3 cm) Weight: 79.4 kg (175 lb) IBW/kg (Calculated) : 75.3  Vital Signs: Temp: 97.3 F (36.3 C) (06/01 0730) Temp Source: Oral (06/01 0730) BP: 127/76 (06/01 0730) Pulse Rate: 58 (06/01 0730)  Labs: Recent Labs    04/27/24 0018 04/27/24 0406  HGB 14.7  --   HCT 42.4  --   PLT 207  --   CREATININE 1.37*  --   TROPONINIHS 37* 35*    Estimated Creatinine Clearance: 51.9 mL/min (A) (by C-G formula based on SCr of 1.37 mg/dL (H)).   Medical History: Past Medical History:  Diagnosis Date   Coronary artery disease    a. Big NSTEMI 11/2014: cath with surgical disease but patient initially refused CABG. Compromise decision was made to do intervention on the RCA with a bare metal stent with medical therapy for other disease and follow-up as an outpatient.   HOH (hard of hearing)    Hyperlipidemia    Ischemic cardiomyopathy    a. a. Cath 12/22/14: EF 25%. b. 2D Echo 12/23/14: EF 25-30%, diffuse hypokinesis, akinesis of the entireinferolateral and inferior myocardium, mild MR.   Myocardial infarction [facility])    NSTEMI   Osteoarthritis    Pneumonia    Tobacco abuse    Transaminitis     Assessment: 73 yr old male initially started on IV heparin  for recent VTE treatment. Pharmacy consulted to transition back to Xarelto .  Patient with hx LLE DVT per duplex 04/12/24.  Recent admit and discharged on Xarelto . If patient began the Xarelto  Starter pack on 5/18, the twice daily dosing would continue through 6/7.  Pt reportedly skipped doses prior to admission so I imagine he has additional doses in his starter pack at home.    Goal of Therapy:  Therapeutic anticoagulation Monitor platelets by anticoagulation protocol: Yes   Plan:  Discontinue heparin  and associated labs. Start Xarelto  15mg  PO BID through 6/7, then change dose to 20mg  daily with supper starting 6/8.   [employer], Pharm.D., BCPS Clinical Pharmacist  **Pharmacist phone directory can be found on [email or web address] listed under [facility].  04/27/2024 8:58 AM

## 2024-04-27 NOTE — H&P (Addendum)
 Date: 04/27/2024               Patient Name:  Casey Jackson MRN: [id number]  DOB: 10-Jun-1951 Age / Sex: 73 y.o., male   PCP: [facility]         Medical Service: Internal Medicine Teaching Service         Attending Physician: Dr. Eldon Greenland, MD      First Contact: Sheree Dieter, MD     Second Contact: Cleven [place], DO         After Hours (After 5p/  First Contact Pager: [phone]  weekends / holidays): Second Contact Pager: [phone]   SUBJECTIVE   Chief Complaint: SOB  History of Present Illness: Casey Jackson is a 72 year old male with a history of  HLD, CAD with previous MI s/p PCI of the RCA  2016, HFrEF 20-25% in 2016, and hearing loss presenting to the ED for SOB.  He was last seen on the service on 04/12/2024 for similar signs and symptoms.  Briefly, he began to notice short of breath primarily on the right side when lying down.  This had progressed to the point that he was unable to lay down, as well as limiting his ability to climb stairs.  History is complicated by transportation issues limiting his ability to follow-up with healthcare providers.  He has had difficulties maintaining his GDMT.    At that visit, he initially declined his diagnostic and therapeutic thoracentesis.  He changes mind on 5/25 and received the procedure. Today, he endorses a similar presentation to his initial visit to the ED a couple weeks ago.  The pain and difficulty breathing continues to occur primarily when laying down.  He declines any leg pain.  He has not been taking his other medications rather than that atorvastatin  given the concern for bleeding.  ED Course: Chest x-ray notable for moderate left pleural effusion that is recurrent Troponin elevated at 37 Macrocytosis of 105 Creatinine of 1.37  Meds:  Atorvastatin  20 mg daily  Not taking aspirin  81 mg daily   Past Medical History Past Medical History:  Diagnosis Date   Coronary artery disease    a. Big NSTEMI 11/2014:  cath with surgical disease but patient initially refused CABG. Compromise decision was made to do intervention on the RCA with a bare metal stent with medical therapy for other disease and follow-up as an outpatient.   HOH (hard of hearing)    Hyperlipidemia    Ischemic cardiomyopathy    a. a. Cath 12/22/14: EF 25%. b. 2D Echo 12/23/14: EF 25-30%, diffuse hypokinesis, akinesis of the entireinferolateral and inferior myocardium, mild MR.   Myocardial infarction [facility])    NSTEMI   Osteoarthritis    Pneumonia    Tobacco abuse    Transaminitis    Past Surgical History:  Procedure Laterality Date   LEFT HEART CATHETERIZATION WITH CORONARY ANGIOGRAM N/A 12/22/2014   Procedure: LEFT HEART CATHETERIZATION WITH CORONARY ANGIOGRAM;  Surgeon: Lucendia Rusk, MD; LAD 100%, CFX 90%, RI mild dz, RCA 95%/70%, EF 25%, CABG recommended   PERCUTANEOUS CORONARY STENT INTERVENTION (PCI-S) N/A 12/23/2014   Procedure: PERCUTANEOUS CORONARY STENT INTERVENTION (PCI-S);  Surgeon: Lucendia Rusk, MD; 3.0 x 24 rebel BMS to the RCA     Social:  Lives With:7-8 roommates in a house Occupation: Retired Corporate investment banker Level of Function: Independent of ADLs and iADLs  PCP: None Substances: Currently declines alcohol, tobacco or recreational drug use  Family History:  Lung cancer in  brother and father  Allergies: Allergies as of 04/26/2024 - Review Complete 04/26/2024  Allergen Reaction Noted   Penicillins Swelling and Other (See Comments) 11/30/2014   Simvastatin Other (See Comments) 01/01/2019   Ergocalciferol Palpitations 11/04/2015    Review of Systems: A complete ROS was negative except as per HPI.   OBJECTIVE:   Physical Exam: Blood pressure 132/76, pulse 76, temperature 98.5 F (36.9 C), temperature source Oral, resp. rate (!) 0, weight 79.4 kg, SpO2 96%.  Constitutional: well-appearing sitting in in no acute distress HENT: normocephalic atraumatic, mucous membranes moist; scabbing of  right ear lobe Eyes: conjunctiva non-erythematous Neck: supple Cardiovascular: regular rate and rhythm, no m/r/g Pulmonary/Chest: normal work of breathing on room air, decreased breath sounds bilaterally more prominent on right Abdominal: soft, non-tender, non-distended MSK: normal bulk and tone Neurological: alert & oriented x 3 Skin: warm and dry  Labs: CBC    Component Value Date/Time   WBC 7.8 04/27/2024 0018   RBC 4.03 (L) 04/27/2024 0018   HGB 14.7 04/27/2024 0018   HCT 42.4 04/27/2024 0018   PLT 207 04/27/2024 0018   MCV 105.2 (H) 04/27/2024 0018   MCH 36.5 (H) 04/27/2024 0018   MCHC 34.7 04/27/2024 0018   RDW 13.2 04/27/2024 0018   LYMPHSABS 1.1 04/27/2024 0018   MONOABS 0.5 04/27/2024 0018   EOSABS 0.3 04/27/2024 0018   BASOSABS 0.0 04/27/2024 0018     CMP     Component Value Date/Time   NA 137 04/27/2024 0018   K 4.1 04/27/2024 0018   CL 100 04/27/2024 0018   CO2 28 04/27/2024 0018   GLUCOSE 116 (H) 04/27/2024 0018   BUN 14 04/27/2024 0018   CREATININE 1.37 (H) 04/27/2024 0018   CALCIUM  8.5 (L) 04/27/2024 0018   PROT 6.0 (L) 04/13/2024 0507   ALBUMIN 3.3 (L) 04/13/2024 0507   AST 15 04/13/2024 0507   ALT 12 04/13/2024 0507   ALKPHOS 57 04/13/2024 0507   BILITOT 1.2 04/13/2024 0507   GFRNONAA 55 (L) 04/27/2024 0018   GFRAA >60 06/23/2020 1149    Imaging: CXR IMPRESSION: 1. Moderate left pleural effusion, recurrent. 2. Associated left lower lobe opacity, recurrent, likely atelectasis.  EKG: personally reviewed my interpretation is sinus rhythm with LBB (see on priors)   ASSESSMENT & PLAN:   Assessment & Plan by Problem: Active Problems:   * No active hospital problems. *   Casey Jackson is a 73 year old male with a history of  HLD, CAD with previous MI s/p PCI of the RCA  2016, HFrEF 20-25% in 2016, and hearing loss presenting to the ED for SOB.    #Pleural effusion #History of heart failure (EF on 04/13/2524 to 30%) Presentation similar to  prior both clinically and on findings from chest x-ray.  Etiology unclear. His fluid studies from his prior thoracentesis were notable a transudate effusion. Furthermore, there were no imaging findings suggestive of lung cancer on his CT scan.  This may be secondary to poor adherence to his medication given his social determinants of health.  Nevertheless, we will consult pulmonology for likely thoracentesis. - Pulmonology referral - Albuterol  nebulizer as needed - Consider as needed Lasix  - Outpatient cardiology follow-up  #Left lower extremity DVT No pain with palpation or increased swelling visualized on assessment.  Not currently taking his aspirin .  Low concern for DVT but will likely need to resume DOAC. - IV heparin    #Severe 3 vessel CAD s/p PCI of RCA in 2016 #Ischemic cardiomyopathy #HLD #HTN  Noted to have been lost to follow-up but previously seen by Dr. [place].  Currently taking atorvastatin  20 mg daily but has not been taking his aspirin . - Hold aspirin  - Continue atorvastatin  20 mg daily  #Right pinna ulceration  Follow-up outpatient  Diet: NPO VTE: None at the moment  IVF: None Code: Full  Prior to Admission Living Arrangement: Home, living roommates Anticipated Discharge Location: Home Barriers to Discharge: SOB  Dispo: Admit patient to Observation with expected length of stay less than 2 midnights.  Signed: Maxie Spaniel, MD Internal Medicine Resident PGY-1  04/27/2024, 2:42 AM

## 2024-04-27 NOTE — Progress Notes (Signed)
 Reviewed AVS, patient expressed understanding of medications, MD follow up reviewed.   Removed IV, Site clean, dry and intact.  Patient states all belongings brought to the hospital at time of admission are accounted for and packed to take home.  Patient informed and expressed understanding of where to pickup discharge medications.  Pt transported to Discharge lounge to wait for transportation home via taxi.

## 2024-04-27 NOTE — Hospital Course (Addendum)
 Casey Jackson is a 73 year old male with a history of  HLD, CAD with previous MI s/p PCI of the RCA  2016, HFrEF 25-20% recently discharged from our service in the setting of ongoing shortness of breath due to left-sided pleural effusion.  At the time of last admission, patient declined thoracentesis and was discharged in fair condition.  He was also found to have lower extremity DVT and was discharged with Xarelto .  He returned to the ED shortly after his discharge due to ongoing shortness of breath and received a thoracentesis, he was not admitted to the hospital following this procedure.  Patient again presented on the a.m. of April 27, 2024 due to chest pain.  He denies shortness of breath during this time, stating his symptoms have resolved from his previous hospitalization.  He has stopped taking his Xarelto  as his chronic right ear wound would bleed while he was on this medication.  Given the patient's ongoing signs/symptoms, GI etiologies seem less likely for his chest pain.  No significant cardiac abnormalities seen on this admission.  He was discharged in fair condition with prescription for Protonix and will follow-up with his primary care provider to establish care on May 06, 2024.   Hospital Course by problem list:  Chest pain Patient endorses ongoing chest pain for the last week.  He says he feels that is related to what he eats, the pain usually subsides by nighttime but when he gets up and drinks coffee and lays back down he endorses chest pain. He states that he believes that when he eats fatty or greasy foods his blood gets thickened and clogs his coronary arteries causing him to have chest pain.  He does endorse recent regurgitation/rumination of enteric contents as well as ongoing indigestion.  EKG and troponins here unremarkable for cardiac etiology.  Certainly is high risk for cardiac chest pain given his well-known history of MI and CAD. Plan: - Discharged with Protonix 40 mg daily -  Will follow with PCP, establishing on June 10 - Consider additional workup such as EGD/right upper quadrant imaging as indicated. - Will require additional cardiology workup  L pleural effusion Patient underwent thoracentesis with Dr. Felipe Horton in ED on 04/20/2024.  Pleural studies most consistent with transudative effusion.  No malignant cells seen at that time.  May be secondary to heart failure.  This admission chest x-ray was obtained which demonstrated reaccumulation of left pleural effusion.  Patient denies dyspnea at this time.  He states he would not like additional procedures for his effusion.   LLE DVT Prescribe Xarelto  on previous discharge, patient stopped taking his medication due to bleeding from his chronic right ear wound.  Recommend patient continues to take this medication as prescribed at previous discharge.  No significant calf tenderness or asymmetry of the lower extremities today.  Trace pedal edema bilaterally.  No pleuritic chest pain or dyspnea to suggest PE.   CKD 3A versus AKI Discharged with furosemide  20 mg p.o. daily.  Creatinine on admission 1.37, similar to prior admission.  GFR 55, likely representing underlying CKD 3A though additional optimization and repeat assessment may be necessary as patient had GFR greater than 60 with creatinine 1.14 when seen in October 2023.   Severe 3 vessel CAD s/p PCI of RCA in 2016 Ischemic cardiomyopathy HLDHTN Declined multiple therapies/intolerant of multiple GDMT medications per past documentation.  Patient states that he will follow-up with cardiology once he established with [facility]. - Patient require dedicated cardiology follow-up  Tobacco use 75-pack-year history.  - Will need AAA screening   Right pinna ulceration Concerning for squamous or basal cell carcinoma.  Recommend careful attention at PCP follow-up.

## 2024-04-27 NOTE — Discharge Instructions (Addendum)
 You were hospitalized for chest pain.  It does not appear that your chest pain is coming from your heart at this time.  Thank you for allowing us  to be part of your care.   We arranged for you to follow up at: [facility] to meet your new primary care doctor on May 06, 2024.   Please note these changes made to your medications:   Please START taking:  Protonix 40 mg daily to help with indigestion and stomach acid which is likely causing your pain.  I would recommend that you continue taking your Xarelto  (blood thinner) for your blood clot in your leg.  Your new doctor should pay careful attention to your right ear, it is concerning that you might have cancer of the ear causing it to scab over and bleed.  Please make sure that you follow with a heart doctor given your cardiac history to further assess your chest pain.  Given your heart failure and the fact that you have fluid around your lung, it would be important to pay careful attention to your breathing and optimize your diuretic regimen to make sure you are not holding onto too much fluid.  I have included additional information regarding your blood clot, kidneys, heart, smoking history, ER, and chest pain and your hospital discharge summary.  If your primary care physician is not able to access these, please have them reach out to the hospital or to obtain this information.      --------------------------------------  Transportation Resources  Contact [facility] DSS to see how to use your Medicaid benefits for transportation [address]  [place], [place] [address] [phone]  Or apply for assistance through [employer] ([employer]) at  AdDates.cz

## 2024-05-02 NOTE — Telephone Encounter (Signed)
 LM for PT 3 attempts. Closing encounter,

## 2024-05-05 ENCOUNTER — Telehealth: Payer: Self-pay

## 2024-05-05 NOTE — Telephone Encounter (Signed)
-----   Message from Callie E Goodrich sent at 04/13/2024  6:12 PM EDT ----- Regarding: Hospital Follow-Up Patient was discharged from the hospital. Per Dr. Germaine Kohut, he needs a follow-up visit within 1-2 week. He has not been seen in our office since 2017 so I think he needs to get re-established with a MD. Can you please call patient and help get him a visit with any MD within the next 1-2 weeks?  Thank you! Callie

## 2024-05-05 NOTE — Telephone Encounter (Signed)
 LVM 3x to schedule NP appt for hosp f/u. Will send letter to pt

## 2024-08-02 ENCOUNTER — Emergency Department (HOSPITAL_COMMUNITY)

## 2024-08-02 ENCOUNTER — Encounter (HOSPITAL_COMMUNITY): Payer: Self-pay

## 2024-08-02 ENCOUNTER — Emergency Department (HOSPITAL_COMMUNITY)
Admission: EM | Admit: 2024-08-02 | Discharge: 2024-08-02 | Disposition: A | Discharge status: Home / Self Care | Attending: Emergency Medicine | Admitting: Emergency Medicine

## 2024-08-02 DIAGNOSIS — R079 Chest pain, unspecified: Secondary | ICD-10-CM

## 2024-08-02 DIAGNOSIS — Z7982 Long term (current) use of aspirin: Secondary | ICD-10-CM | POA: Diagnosis not present

## 2024-08-02 DIAGNOSIS — R944 Abnormal results of kidney function studies: Secondary | ICD-10-CM | POA: Insufficient documentation

## 2024-08-02 DIAGNOSIS — I509 Heart failure, unspecified: Secondary | ICD-10-CM | POA: Diagnosis not present

## 2024-08-02 DIAGNOSIS — Z79899 Other long term (current) drug therapy: Secondary | ICD-10-CM | POA: Insufficient documentation

## 2024-08-02 DIAGNOSIS — Z760 Encounter for issue of repeat prescription: Secondary | ICD-10-CM

## 2024-08-02 DIAGNOSIS — Z7901 Long term (current) use of anticoagulants: Secondary | ICD-10-CM | POA: Diagnosis not present

## 2024-08-02 DIAGNOSIS — R0789 Other chest pain: Secondary | ICD-10-CM | POA: Insufficient documentation

## 2024-08-02 DIAGNOSIS — I2510 Atherosclerotic heart disease of native coronary artery without angina pectoris: Secondary | ICD-10-CM | POA: Diagnosis not present

## 2024-08-02 LAB — COMPREHENSIVE METABOLIC PANEL WITH GFR
ALT: 10 U/L (ref 0–44)
AST: 16 U/L (ref 15–41)
Albumin: 3.7 g/dL (ref 3.5–5.0)
Alkaline Phosphatase: 67 U/L (ref 38–126)
Anion gap: 11 (ref 5–15)
BUN: 11 mg/dL (ref 8–23)
CO2: 25 mmol/L (ref 22–32)
Calcium: 8.8 mg/dL — ABNORMAL LOW (ref 8.9–10.3)
Chloride: 101 mmol/L (ref 98–111)
Creatinine, Ser: 1.36 mg/dL — ABNORMAL HIGH (ref 0.61–1.24)
GFR, Estimated: 55 mL/min — ABNORMAL LOW (ref >60)
Glucose, Bld: 131 mg/dL — ABNORMAL HIGH (ref 70–99)
Potassium: 4.3 mmol/L (ref 3.5–5.1)
Sodium: 137 mmol/L (ref 135–145)
Total Bilirubin: 1 mg/dL (ref 0.0–1.2)
Total Protein: 6.5 g/dL (ref 6.5–8.1)

## 2024-08-02 LAB — TROPONIN I (HIGH SENSITIVITY)
Troponin I (High Sensitivity): 21 ng/L — ABNORMAL HIGH (ref <18)
Troponin I (High Sensitivity): 22 ng/L — ABNORMAL HIGH

## 2024-08-02 LAB — CBC
HCT: 43.7 % (ref 39.0–52.0)
Hemoglobin: 14.5 g/dL (ref 13.0–17.0)
MCH: 36.5 pg — ABNORMAL HIGH (ref 26.0–34.0)
MCHC: 33.2 g/dL (ref 30.0–36.0)
MCV: 110.1 fL — ABNORMAL HIGH (ref 80.0–100.0)
Platelets: 227 K/uL (ref 150–400)
RBC: 3.97 MIL/uL — ABNORMAL LOW (ref 4.22–5.81)
RDW: 13.8 % (ref 11.5–15.5)
WBC: 6.5 K/uL (ref 4.0–10.5)
nRBC: 0 % (ref 0.0–0.2)

## 2024-08-02 MED ORDER — ATORVASTATIN CALCIUM 20 MG PO TABS
20.0000 mg | ORAL_TABLET | Freq: Every day | ORAL | 0 refills | Status: AC
Start: 2024-08-02 — End: ?

## 2024-08-02 MED ORDER — ASPIRIN 81 MG PO CHEW
324.0000 mg | CHEWABLE_TABLET | Freq: Once | ORAL | Status: AC
  Administered 2024-08-02: 324 mg via ORAL
  Filled 2024-08-02: qty 4

## 2024-08-02 MED ORDER — IOHEXOL 350 MG/ML SOLN
75.0000 mL | Freq: Once | INTRAVENOUS | Status: AC | PRN
  Administered 2024-08-02: 75 mL via INTRAVENOUS

## 2024-08-02 NOTE — Discharge Instructions (Addendum)
 Follow very closely with your primary care doctor General instructions Take over-the-counter and prescription medicines only as told by your health care provider. Keep all follow-up visits. Your health care provider will monitor your condition and may need to change your treatment plan over time. Where to find more information American Heart Association: heart.org Get help right away if: You have any symptoms of a stroke. BE FAST is an easy way to remember the main warning signs of a stroke: B - Balance. Signs are dizziness, sudden trouble walking, or loss of balance. E - Eyes. Signs are trouble seeing or a sudden change in vision. F - Face. Signs are sudden weakness or numbness of the face, or the face or eyelid drooping on one side. A - Arms. Signs are weakness or numbness in an arm. This happens suddenly and usually on one side of the body. S - Speech. Signs are sudden trouble speaking, slurred speech, or trouble understanding what people say. T - Time. Time to call emergency services. Write down what time symptoms started. You have other signs of a stroke, such as: A sudden, severe headache with no known cause. Nausea or vomiting. Seizure. These symptoms may be an emergency. Get help right away. Call 911. Do not wait to see if the symptoms will go away. Do not drive yourself to the hospital.

## 2024-08-02 NOTE — ED Triage Notes (Signed)
 Pt BIB [facility] from home with c/o chest tightness that started this morning. Reports he ran of of his cholesterol meds 4 days ago and took his friends cholesterol pill yesterday afternoon.   142/82 Hr 76 95% room air

## 2024-08-02 NOTE — ED Provider Notes (Signed)
 High risk chest pain, previous dvt - did not take hi DOAC. Waiting for CTA.Patient does not want to be admitted Physical Exam  BP (!) 143/84   Pulse 73   Temp 98 F (36.7 C)   Resp (!) 22   SpO2 95%   Physical Exam  Procedures  Procedures  ED Course / MDM   Clinical Course as of 08/02/24 1556  Sat Aug 02, 2024  1553 I  [AH]  1553 CT Angio Chest PE W/Cm &/Or Wo Cm I visualized and interpreted CT angiogram of the chest which shows no evidence of pulmonary embolus.  Patient has chronic findings that are unchanged.  At this time patient may follow-up in the outpatient setting with primary care and cardiology. [AH]    Clinical Course User Index [AH] Arloa Chroman, PA-C   Medical Decision Making Amount and/or Complexity of Data Reviewed Labs: ordered. Radiology: ordered. Decision-making details documented in ED Course.  Risk OTC drugs. Prescription drug management.          Arloa Chroman, PA-C 08/02/24 1556    Randol Simmonds, MD 08/03/24 [phone]

## 2024-08-02 NOTE — ED Notes (Addendum)
 SABRA

## 2024-08-02 NOTE — ED Provider Notes (Signed)
 [place] [facility] Provider Note   CSN: [id number] Arrival date & time: 08/02/24  1058     Patient presents with: Chest Pain   Casey Jackson is a 73 y.o. male with PMHx CAD, HLD, OA, CHF, recurrent pleural effusion who presents to ED concerned for chest tightness starting around 8AM this morning. Patient stating that he is having these symptoms because he took one of his friend's cholesterol medications yesterday. Patient does not currently have a PCP and states that he ran out of his own cholesterol medications earlier this week.  Denies fever, cough, SOB, nausea, vomting, dairrhea.   Of note, patient stating that he was diagnosed with DVT in the past and was started on blood thinners, but only took 3 days of this medication because he cut himself while shaving his face and it was difficult to control the bleeding. Patient stating that he does not follow with any outpatient cardiologists or PCP - but he is attempting to get established with [facility].    Chest Pain      Prior to Admission medications   Medication Sig Start Date End Date Taking? Authorizing Provider  acetaminophen  (TYLENOL ) 500 MG tablet Take 1 tablet (500 mg total) by mouth every 6 (six) hours as needed. Patient not taking: Reported on 09/05/2019 07/13/16   Rumalda Fleeting, PA-C  aspirin  81 MG tablet Take 81 mg by mouth at bedtime.     [provider]  atorvastatin  (LIPITOR ) 20 MG tablet Take 1 tablet (20 mg total) by mouth daily. 12/23/23   Charlyn Sora, MD  furosemide  (LASIX ) 20 MG tablet Take 1 tablet (20 mg total) by mouth daily as needed. Leg swelling or shortness of breath 04/13/24   Elnora Ip, MD  pantoprazole  (PROTONIX ) 40 MG tablet Take 1 tablet (40 mg total) by mouth daily. 04/28/24   Gabino Boga, MD  rivaroxaban  (XARELTO ) 20 MG TABS tablet Take 1 tablet (20 mg total) by mouth daily with supper. 05/13/24   Eben Reyes BROCKS, MD  RIVAROXABAN   (XARELTO ) VTE STARTER PACK (15 & 20 MG) Follow package directions: Take one 15mg  tablet by mouth twice a day. On day 22, switch to one 20mg  tablet once a day. Take with food. 04/13/24   Elnora Ip, MD    Allergies: Penicillins, Simvastatin, and Ergocalciferol    Review of Systems  Cardiovascular:  Positive for chest pain.    Updated Vital Signs BP (!) 141/72   Pulse 83   Resp (!) 25   SpO2 95%   Physical Exam Vitals and nursing note reviewed.  Constitutional:      General: He is not in acute distress.    Appearance: He is not ill-appearing or toxic-appearing.  HENT:     Head: Normocephalic and atraumatic.     Mouth/Throat:     Mouth: Mucous membranes are moist.     Pharynx: No oropharyngeal exudate or posterior oropharyngeal erythema.  Eyes:     General: No scleral icterus.       Right eye: No discharge.        Left eye: No discharge.     Conjunctiva/sclera: Conjunctivae normal.  Cardiovascular:     Rate and Rhythm: Normal rate and regular rhythm.     Pulses: Normal pulses.     Heart sounds: Normal heart sounds. No murmur heard. Pulmonary:     Effort: Pulmonary effort is normal. No respiratory distress.     Breath sounds: Normal breath sounds. No wheezing, rhonchi  or rales.  Abdominal:     General: Bowel sounds are normal.     Palpations: Abdomen is soft.     Tenderness: There is no abdominal tenderness.  Musculoskeletal:     Right lower leg: No edema.     Left lower leg: No edema.  Skin:    General: Skin is warm and dry.     Findings: No rash.  Neurological:     General: No focal deficit present.     Mental Status: He is alert and oriented to person, place, and time. Mental status is at baseline.  Psychiatric:        Mood and Affect: Mood normal.        Behavior: Behavior normal.     (all labs ordered are listed, but only abnormal results are displayed) Labs Reviewed - No data to display  EKG: None  Radiology: No results  found.   Procedures   Medications Ordered in the ED - No data to display                                  Medical Decision Making Amount and/or Complexity of Data Reviewed Labs: ordered. Radiology: ordered.  Risk OTC drugs. Prescription drug management.   This patient presents to the ED for concern of chest pain, this involves an extensive number of treatment options, and is a complaint that carries with it a high risk of complications and morbidity.  The differential diagnosis includes acute coronary syndrome, congestive heart failure, pericarditis, pneumonia, pulmonary embolism, tension pneumothorax, esophageal rupture, aortic dissection, cardiac tamponade, musculoskeletal   Co morbidities that complicate the patient evaluation  CAD, HLD, OA, CHF, recurrent pleural effusion   Additional history obtained:  Additional history obtained from 03/2024 DVT US : left leg DVT 03/2024 ECHO: 25-30% EF   Problem List / ED Course / Critical interventions / Medication management  Patient presented for chest pain. Started around 8AM this morning. Pain resolved with ASA given to patient in ED. Patient with extensive cardiac history but does not follow with outpatient cardiologist.  Patient was prescribed blood thinners last May for a DVT in his left leg but did not take full course.  Patient currently denies any symptoms of DVT.  Physical exam reassuring.  Patient afebrile with stable vitals. I Ordered, and personally interpreted labs.  CBC without leukocytosis or anemia.  CMP with elevation in creatinine near patient's baseline currently at 1.36 today.  Initial troponin 21. The patient was maintained on a cardiac monitor.  I personally viewed and interpreted the EKG/cardiac monitored which showed an underlying rhythm of: Sinus rhythm. I ordered imaging studies including chest xray and CTA chest to assess for process contributing to patient's symptoms.  Chest x-ray showing patient's chronic  left pleural effusion which has increased in size today.  There is also mild left basilar patchy atelectasis versus pneumonia.  CTA chest pending. Patient wanting to leave to go home now. Patient just wants a refill of HLD medications. However, after conversation about the risks of incomplete medical workup today, patient agreed to stay for the CT scan. I have reviewed the patients home medicines and have made adjustments as needed Staffed with Dr. Patsey   Social Determinants of Health:  geriatric  3PM [facility] [place] transferred to PA Harris at the end of my shift as the patient will require reassessment once labs/imaging have resulted. Patient presentation, ED course, and plan  of care discussed with review of all pertinent labs and imaging. Please see his/her note for further details regarding further ED course and disposition. Plan at time of handoff is reassess patient after imaging and delta troponin. This may be altered or completely changed at the discretion of the oncoming team pending results of further workup.      Final diagnoses:  None    ED Discharge Orders     None          Hoy Nidia JULIANNA DEVONNA 08/02/24 1510    Patsey Lot, MD 08/06/24 [phone]
